# Patient Record
Sex: Female | Born: 1960 | Race: White | Hispanic: No | Marital: Married | Smoking: Former smoker
Health system: Southern US, Community
[De-identification: ages and names within clinical notes are randomized; demographics above are authoritative.]

## PROBLEM LIST (undated history)

## (undated) DIAGNOSIS — I10 Essential (primary) hypertension: Secondary | ICD-10-CM

## (undated) DIAGNOSIS — F32A Depression, unspecified: Secondary | ICD-10-CM

## (undated) DIAGNOSIS — T7840XA Allergy, unspecified, initial encounter: Secondary | ICD-10-CM

## (undated) DIAGNOSIS — F419 Anxiety disorder, unspecified: Secondary | ICD-10-CM

## (undated) DIAGNOSIS — K5900 Constipation, unspecified: Secondary | ICD-10-CM

## (undated) DIAGNOSIS — N200 Calculus of kidney: Secondary | ICD-10-CM

## (undated) DIAGNOSIS — I272 Pulmonary hypertension, unspecified: Secondary | ICD-10-CM

## (undated) DIAGNOSIS — F329 Major depressive disorder, single episode, unspecified: Secondary | ICD-10-CM

## (undated) DIAGNOSIS — K219 Gastro-esophageal reflux disease without esophagitis: Secondary | ICD-10-CM

## (undated) DIAGNOSIS — Z9889 Other specified postprocedural states: Secondary | ICD-10-CM

## (undated) DIAGNOSIS — G43909 Migraine, unspecified, not intractable, without status migrainosus: Secondary | ICD-10-CM

## (undated) DIAGNOSIS — M1990 Unspecified osteoarthritis, unspecified site: Secondary | ICD-10-CM

## (undated) DIAGNOSIS — J302 Other seasonal allergic rhinitis: Secondary | ICD-10-CM

## (undated) DIAGNOSIS — G8929 Other chronic pain: Secondary | ICD-10-CM

## (undated) DIAGNOSIS — E785 Hyperlipidemia, unspecified: Secondary | ICD-10-CM

## (undated) DIAGNOSIS — C4491 Basal cell carcinoma of skin, unspecified: Secondary | ICD-10-CM

## (undated) HISTORY — DX: Migraine, unspecified, not intractable, without status migrainosus: G43.909

## (undated) HISTORY — DX: Allergy, unspecified, initial encounter: T78.40XA

## (undated) HISTORY — DX: Other seasonal allergic rhinitis: J30.2

## (undated) HISTORY — DX: Essential (primary) hypertension: I10

## (undated) HISTORY — DX: Anxiety disorder, unspecified: F41.9

## (undated) HISTORY — DX: Depression, unspecified: F32.A

## (undated) HISTORY — PX: POLYPECTOMY: SHX149

## (undated) HISTORY — PX: COLONOSCOPY: SHX174

## (undated) HISTORY — DX: Hyperlipidemia, unspecified: E78.5

## (undated) HISTORY — DX: Constipation, unspecified: K59.00

## (undated) HISTORY — DX: Other chronic pain: G89.29

## (undated) HISTORY — DX: Gastro-esophageal reflux disease without esophagitis: K21.9

## (undated) HISTORY — DX: Unspecified osteoarthritis, unspecified site: M19.90

## (undated) HISTORY — DX: Calculus of kidney: N20.0

## (undated) HISTORY — DX: Major depressive disorder, single episode, unspecified: F32.9

## (undated) HISTORY — DX: Basal cell carcinoma of skin, unspecified: C44.91

---

## 1987-03-04 HISTORY — PX: HEMORRHOID SURGERY: SHX153

## 1988-03-03 HISTORY — PX: TUBAL LIGATION: SHX77

## 1997-06-22 ENCOUNTER — Encounter: Admission: RE | Admit: 1997-06-22 | Discharge: 1997-09-20 | Payer: Self-pay | Admitting: Family Medicine

## 1998-05-31 ENCOUNTER — Encounter: Payer: Self-pay | Admitting: Emergency Medicine

## 1998-05-31 ENCOUNTER — Emergency Department (HOSPITAL_COMMUNITY): Admission: EM | Admit: 1998-05-31 | Discharge: 1998-05-31 | Payer: Self-pay | Admitting: Emergency Medicine

## 2001-07-22 ENCOUNTER — Other Ambulatory Visit: Admission: RE | Admit: 2001-07-22 | Discharge: 2001-07-22 | Payer: Self-pay | Admitting: Unknown Physician Specialty

## 2004-03-03 HISTORY — PX: OTHER SURGICAL HISTORY: SHX169

## 2004-06-18 ENCOUNTER — Encounter: Admission: RE | Admit: 2004-06-18 | Discharge: 2004-07-25 | Payer: Self-pay | Admitting: Family Medicine

## 2004-08-07 ENCOUNTER — Encounter: Admission: RE | Admit: 2004-08-07 | Discharge: 2004-08-07 | Payer: Self-pay | Admitting: Neurosurgery

## 2004-08-21 ENCOUNTER — Encounter: Admission: RE | Admit: 2004-08-21 | Discharge: 2004-08-21 | Payer: Self-pay | Admitting: Neurosurgery

## 2004-12-16 ENCOUNTER — Encounter: Admission: RE | Admit: 2004-12-16 | Discharge: 2004-12-16 | Payer: Self-pay | Admitting: Neurosurgery

## 2005-05-17 ENCOUNTER — Emergency Department (HOSPITAL_COMMUNITY): Admission: EM | Admit: 2005-05-17 | Discharge: 2005-05-17 | Payer: Self-pay | Admitting: Emergency Medicine

## 2005-05-20 ENCOUNTER — Encounter: Admission: RE | Admit: 2005-05-20 | Discharge: 2005-05-20 | Payer: Self-pay | Admitting: Neurosurgery

## 2005-06-04 ENCOUNTER — Encounter: Admission: RE | Admit: 2005-06-04 | Discharge: 2005-06-04 | Payer: Self-pay | Admitting: Neurosurgery

## 2005-07-16 ENCOUNTER — Encounter: Admission: RE | Admit: 2005-07-16 | Discharge: 2005-07-16 | Payer: Self-pay | Admitting: Neurosurgery

## 2005-08-05 ENCOUNTER — Ambulatory Visit (HOSPITAL_COMMUNITY): Admission: RE | Admit: 2005-08-05 | Discharge: 2005-08-06 | Payer: Self-pay | Admitting: Neurosurgery

## 2009-03-06 ENCOUNTER — Encounter: Admission: RE | Admit: 2009-03-06 | Discharge: 2009-03-06 | Payer: Self-pay | Admitting: Neurosurgery

## 2010-03-24 ENCOUNTER — Encounter: Payer: Self-pay | Admitting: Neurosurgery

## 2010-07-19 NOTE — H&P (Signed)
Lisa Parrish, Lisa Parrish                ACCOUNT NO.:  [id number]   MEDICAL RECORD NO.:  [id number]          PATIENT TYPE:  AMB   LOCATION:  SDS                          FACILITY:  [facility]   PHYSICIAN:  Payton Doughty, M.D.      DATE OF BIRTH:  1961-02-15   DATE OF ADMISSION:  08/05/2005  DATE OF DISCHARGE:                                HISTORY & PHYSICAL   ADMISSION DIAGNOSES:  Herniated disk on the left side at L5-S1.   HISTORY OF PRESENT ILLNESS:  This is a 50 year old right handed white female  who hurt her back in April 2006, doing well and had gotten better from her  disk, and really had not had any trouble until about a month and a half ago  when she began having pain in her back and down her left leg.  MR showed  herniated disk at L5-S1.  She did epidural steroids and got better.  However, returned to work and had a marked increase of pain down her leg and  is now admitted for discectomy.   PAST MEDICAL HISTORY:  Hypertension.   MEDICATIONS:  1.  Toprol.  2.  Prempro.  3.  Wellbutrin.  4.  Percocet.   ALLERGIES:  None.   PAST SURGICAL HISTORY:  1.  Tubal ligation.  2.  Hemorrhoids in the past.   SOCIAL HISTORY:  She does not smoke or drink and works for [employer].   FAMILY HISTORY:  Mom died at 11 of lymphoma.  Dad died at 59 with  hypertension.   REVIEW OF SYSTEMS:  Remarkable for back and leg pain.  HEENT: Within normal  limits.  She has good range of motion.  NECK/CHEST:  Clear.  CARDIAC:  Regular rate and rhythm.  ABDOMEN:  Nontender with hepatosplenomegaly.  EXTREMITIES:  Without cyanosis, clubbing or edema.  GU:  Deferred.  Peripheral pulses good.  NEUROLOGICAL:  Awake, alert and oriented.  Cranial  nerves are intact.  Strength 5/5 in the upper and lower extremities.  Plantar flexor of the left foot where she cannot toe stand.  Sensation is  diminished in the left S1 distribution.  Deep tendon reflexes are 2 at the  knees, 2 at the right ankle, absent at  the left.  Straight leg raising is  positive on the left.   STUDIES:  New MR shows more disk material coming out at L5-S1.   CLINICAL IMPRESSION:  Herniated disk at L5-S1 with left S1 radiculopathy.   PLAN:  Lumbar laminectomy, diskectomy at that level.  The risks and benefits  have been discussed with her and she wished to proceed.           ______________________________  Payton Doughty, M.D.     MWR/MEDQ  D:  08/05/2005  T:  08/05/2005  Job:  [id number]

## 2010-07-19 NOTE — Op Note (Signed)
NAMELISSA, ROWLES                ACCOUNT NO.:  [id number]   MEDICAL RECORD NO.:  [id number]          PATIENT TYPE:  OIB   LOCATION:  3007                         FACILITY:  [facility]   PHYSICIAN:  Payton Doughty, M.D.      DATE OF BIRTH:  1960/03/24   DATE OF PROCEDURE:  08/05/2005  DATE OF DISCHARGE:                                 OPERATIVE REPORT   PREOPERATIVE DIAGNOSIS:  Herniated disk L5 S1 on left.   POSTOPERATIVE DIAGNOSIS:  Herniated disk L5 S1 on left.   OPERATIVE PROCEDURE:  L5 S1 laminectomy and diskectomy.   SERVICE:  Neurosurgery.   ANESTHESIA:  General endotracheal.   PREPARATION:  Prepped with alcohol wipe.   COMPLICATIONS:  None.   NURSE ASSISTANT:  [place].   DOCTOR ASSISTANT:  Danae Orleans. Venetia Maxon, M.D.   This is a 50 year old girl herniated disk L5 S1 on the left.  Taken to the  operating room __________ intubated, placed prone on the operating room  table.  Following shave, prepped and draped in the usual sterile fashion.  The skin was infiltrated with 1% lidocaine and 1:400,000 epinephrine.  The  skin was incised from bottom of L4 to the bottom of L5.  The lamina of L5  was exposed on the left side in subperiosteal plane.  Intraoperative x-rays  showed a marker under 4.  A hemisemilaminectomy of L5 was carried out with a  high speed drill as well as the Kerrison punch.  The bone was removed to the  top of the ligamentum flavum and was removed in a retrograde fashion.  Lateral extension was to the medial border of the L5 root.  The L5 root was  dissected out and there was a fragmented disk compressing it as it exited  the neuroforamen, this was removed without difficulty; there was an  inferiorly extruded fragment at L5 S1.  The anterior aspect of the S1 nerve  root was explored and a piece of disk identified and removed.  The disk  space was explored and all graspable fragments removed.  The wound was  irrigated, hemostasis assured.  Depo-Medrol soaked fat  placed in laminectomy  defect.  The fascia and subcutaneous tissues reapproximated with #0 Vicryl  in interrupted fashion, the subcuticular tissue was reapproximated with #3-0  Vicryl in interrupted fashion, the skin was closed with #4-0 Vicryl in  running subcuticular fashion.  Benzoin and Steri-Strips were placed  __________ Telfa and Op-Site.  The patient returned to the recovery room in  good condition.          ______________________________  Payton Doughty, M.D.    MWR/MEDQ  D:  08/05/2005  T:  08/05/2005  Job:  [id number]

## 2011-08-13 ENCOUNTER — Encounter: Payer: Self-pay | Admitting: Gastroenterology

## 2011-09-18 ENCOUNTER — Ambulatory Visit (AMBULATORY_SURGERY_CENTER): Payer: Managed Care, Other (non HMO) | Admitting: *Deleted

## 2011-09-18 VITALS — Ht 66.0 in | Wt 166.2 lb

## 2011-09-18 DIAGNOSIS — Z1211 Encounter for screening for malignant neoplasm of colon: Secondary | ICD-10-CM

## 2011-09-18 MED ORDER — MOVIPREP 100 G PO SOLR: ORAL | Status: DC

## 2011-10-02 ENCOUNTER — Ambulatory Visit (AMBULATORY_SURGERY_CENTER): Payer: Managed Care, Other (non HMO) | Admitting: Gastroenterology

## 2011-10-02 ENCOUNTER — Encounter: Payer: Self-pay | Admitting: Gastroenterology

## 2011-10-02 VITALS — BP 145/85 | HR 77 | Temp 97.1°F | Resp 20 | Ht 66.0 in | Wt 166.0 lb

## 2011-10-02 DIAGNOSIS — Z1211 Encounter for screening for malignant neoplasm of colon: Secondary | ICD-10-CM

## 2011-10-02 DIAGNOSIS — I82C19 Acute embolism and thrombosis of unspecified internal jugular vein: Secondary | ICD-10-CM

## 2011-10-02 DIAGNOSIS — D126 Benign neoplasm of colon, unspecified: Secondary | ICD-10-CM

## 2011-10-02 DIAGNOSIS — I82B19 Acute embolism and thrombosis of unspecified subclavian vein: Secondary | ICD-10-CM

## 2011-10-02 MED ORDER — SODIUM CHLORIDE 0.9 % IV SOLN: 500.0000 mL | INTRAVENOUS | Status: DC

## 2011-10-02 NOTE — Patient Instructions (Addendum)
Discharge instructions given with verbal understanding. Handout on polyp given. Resume previous medications. YOU HAD AN ENDOSCOPIC PROCEDURE TODAY AT THE [facility]: Refer to the procedure report that was given to you for any specific questions about what was found during the examination.  If the procedure report does not answer your questions, please call your gastroenterologist to clarify.  If you requested that your care partner not be given the details of your procedure findings, then the procedure report has been included in a sealed envelope for you to review at your convenience later.  YOU SHOULD EXPECT: Some feelings of bloating in the abdomen. Passage of more gas than usual.  Walking can help get rid of the air that was put into your GI tract during the procedure and reduce the bloating. If you had a lower endoscopy (such as a colonoscopy or flexible sigmoidoscopy) you may notice spotting of blood in your stool or on the toilet paper. If you underwent a bowel prep for your procedure, then you may not have a normal bowel movement for a few days.  DIET: Your first meal following the procedure should be a light meal and then it is ok to progress to your normal diet.  A half-sandwich or bowl of soup is an example of a good first meal.  Heavy or fried foods are harder to digest and may make you feel nauseous or bloated.  Likewise meals heavy in dairy and vegetables can cause extra gas to form and this can also increase the bloating.  Drink plenty of fluids but you should avoid alcoholic beverages for 24 hours.  ACTIVITY: Your care partner should take you home directly after the procedure.  You should plan to take it easy, moving slowly for the rest of the day.  You can resume normal activity the day after the procedure however you should NOT DRIVE or use heavy machinery for 24 hours (because of the sedation medicines used during the test).    SYMPTOMS TO REPORT IMMEDIATELY: A  gastroenterologist can be reached at any hour.  During normal business hours, 8:30 AM to 5:00 PM Monday through Friday, call [phone].  After hours and on weekends, please call the GI answering service at [phone] who will take a message and have the physician on call contact you.   Following lower endoscopy (colonoscopy or flexible sigmoidoscopy):  Excessive amounts of blood in the stool  Significant tenderness or worsening of abdominal pains  Swelling of the abdomen that is new, acute  Fever of 100F or higher   FOLLOW UP: If any biopsies were taken you will be contacted by phone or by letter within the next 1-3 weeks.  Call your gastroenterologist if you have not heard about the biopsies in 3 weeks.  Our staff will call the home number listed on your records the next business day following your procedure to check on you and address any questions or concerns that you may have at that time regarding the information given to you following your procedure. This is a courtesy call and so if there is no answer at the home number and we have not heard from you through the emergency physician on call, we will assume that you have returned to your regular daily activities without incident.  SIGNATURES/CONFIDENTIALITY: You and/or your care partner have signed paperwork which will be entered into your electronic medical record.  These signatures attest to the fact that that the information above on your After Visit Summary   has been reviewed and is understood.  Full responsibility of the confidentiality of this discharge information lies with you and/or your care-partner.  

## 2011-10-02 NOTE — Op Note (Signed)
[facility] [address] [place], [place]  [address]  COLONOSCOPY PROCEDURE REPORT  PATIENT:  Lisa Parrish, Lisa Parrish  MR#:  [id number] BIRTHDATE:  1961-01-26, 50 yrs. old  GENDER:  female ENDOSCOPIST:  Barbette Hair. Arlyce Dice, MD REF. BY:  Rudi Heap, M.D. PROCEDURE DATE:  10/02/2011 PROCEDURE:  Colonoscopy with snare polypectomy ASA CLASS:  Class II INDICATIONS:  Routine Risk Screening MEDICATIONS:   MAC sedation, administered by CRNA propofol 350mg IV  DESCRIPTION OF PROCEDURE:   After the risks benefits and alternatives of the procedure were thoroughly explained, informed consent was obtained.  Digital rectal exam was performed and revealed moderate external hemorrhoids.   The LB CF-[id number] endoscope was introduced through the anus and advanced to the cecum, which was identified by both the appendix and ileocecal valve, without limitations.  The quality of the prep was excellent, using MoviPrep.  The instrument was then slowly withdrawn as the colon was fully examined. <<PROCEDUREIMAGES>>  FINDINGS:  A pedunculated polyp was found in the sigmoid colon. It was 12 mm in size. It was found 20 cm from the point of entry. Polyp was snared, then cauterized with monopolar cautery. Retrieval was successful (see image4). snare polyp  This was otherwise a normal examination of the colon (see image3, image2, and image5).   Retroflexed views in the rectum revealed no abnormalities.    The time to cecum =  1) 3.25  minutes. The scope was then withdrawn in  1) 6.25  minutes from the cecum and the procedure completed. COMPLICATIONS:  None ENDOSCOPIC IMPRESSION: 1) 12 mm pedunculated polyp in the sigmoid colon 2) Otherwise normal examination RECOMMENDATIONS: 1) If the polyp(s) removed today are proven to be adenomatous (pre-cancerous) polyps, you will need a repeat colonoscopy in 5 years. Otherwise you should continue to follow colorectal cancer screening guidelines for  "routine risk" patients with colonoscopy in 10 years. You will receive a letter within 1-2 weeks with the results of your biopsy as well as final recommendations. Please call my office if you have not received a letter after 3 weeks. REPEAT EXAM:  You will receive a letter from Dr. Arlyce Dice in 1-2 weeks, after reviewing the final pathology, with followup recommendations.  ______________________________ Barbette Hair Arlyce Dice, MD  CC:  n. eSIGNED:   Barbette Hair. Rifka Ramey at 10/02/2011 09:22 AM  Harter-harris, Lupita Leash, [phone]

## 2011-10-02 NOTE — Progress Notes (Signed)
Patient did not experience any of the following events: a burn prior to discharge; a fall within the facility; wrong site/side/patient/procedure/implant event; or a hospital transfer or hospital admission upon discharge from the facility. (G8907) Patient did not have preoperative order for IV antibiotic SSI prophylaxis. (G8918)  

## 2011-10-02 NOTE — Progress Notes (Signed)
ropofol per Ashley amp crna. See scanned intra procedure report. All meds titrated per crna throughout procedure. ewm

## 2011-10-03 ENCOUNTER — Telehealth: Payer: Self-pay | Admitting: *Deleted

## 2011-10-03 NOTE — Telephone Encounter (Signed)
  Follow up Call-  Call back number 10/02/2011  Post procedure Call Back phone  # [phone]  Permission to leave phone message Yes     Patient questions:  Do you have a fever, pain , or abdominal swelling? no Pain Score  0 *  Have you tolerated food without any problems? yes  Have you been able to return to your normal activities? yes  Do you have any questions about your discharge instructions: Diet   no Medications  no Follow up visit  no  Do you have questions or concerns about your Care? no  Actions: * If pain score is 4 or above: No action needed, pain <4.

## 2011-10-08 ENCOUNTER — Encounter: Payer: Self-pay | Admitting: Gastroenterology

## 2012-06-29 ENCOUNTER — Other Ambulatory Visit: Payer: Self-pay | Admitting: *Deleted

## 2012-06-29 MED ORDER — BUPROPION HCL ER (XL) 300 MG PO TB24: 300.0000 mg | ORAL_TABLET | Freq: Every day | ORAL | Status: DC

## 2012-06-29 NOTE — Telephone Encounter (Signed)
Last office visit 08-13-11. Please advise. Thank you

## 2012-07-02 ENCOUNTER — Other Ambulatory Visit: Payer: Self-pay

## 2012-07-02 MED ORDER — PRAVASTATIN SODIUM 10 MG PO TABS: 10.0000 mg | ORAL_TABLET | Freq: Every day | ORAL | Status: DC

## 2012-07-02 NOTE — Telephone Encounter (Signed)
Last lipids 6/13

## 2012-08-04 ENCOUNTER — Telehealth: Payer: Self-pay | Admitting: Family Medicine

## 2012-08-04 ENCOUNTER — Ambulatory Visit (INDEPENDENT_AMBULATORY_CARE_PROVIDER_SITE_OTHER): Payer: Medicare HMO | Admitting: General Practice

## 2012-08-04 ENCOUNTER — Encounter: Payer: Self-pay | Admitting: General Practice

## 2012-08-04 VITALS — BP 124/88 | HR 92 | Temp 98.0°F | Ht 66.0 in | Wt 171.0 lb

## 2012-08-04 DIAGNOSIS — J029 Acute pharyngitis, unspecified: Secondary | ICD-10-CM

## 2012-08-04 LAB — POCT RAPID STREP A (OFFICE): Rapid Strep A Screen: NEGATIVE

## 2012-08-04 NOTE — Patient Instructions (Addendum)

## 2012-08-04 NOTE — Progress Notes (Signed)
  Subjective:    Patient ID: Lisa Parrish, female    DOB: 1960-09-01, 52 y.o.   MRN: [id number]  Sore Throat  This is a new problem. The current episode started yesterday. The problem has been gradually worsening. Neither side of throat is experiencing more pain than the other. There has been no fever. The pain is at a severity of 8/10. Associated symptoms include congestion and headaches. Pertinent negatives include no abdominal pain, coughing, ear pain, neck pain or shortness of breath.      Review of Systems  Constitutional: Negative for fever and chills.  HENT: Positive for congestion and sore throat. Negative for ear pain, rhinorrhea, sneezing, neck pain, neck stiffness, postnasal drip and sinus pressure.   Respiratory: Negative for cough, chest tightness and shortness of breath.   Cardiovascular: Negative for chest pain and palpitations.  Gastrointestinal: Negative for abdominal pain.  Skin: Negative.   Neurological: Positive for headaches. Negative for syncope and weakness.  All other systems reviewed and are negative.       Objective:   Physical Exam  Constitutional: She is oriented to person, place, and time. She appears well-developed and well-nourished.  HENT:  Head: Normocephalic and atraumatic.  Right Ear: External ear normal.  Left Ear: External ear normal.  Cardiovascular: Normal rate, regular rhythm and normal heart sounds.   Pulmonary/Chest: Effort normal and breath sounds normal.  Neurological: She is alert and oriented to person, place, and time.  Skin: Skin is warm and dry.  Psychiatric: She has a normal mood and affect.   Results for orders placed in visit on 08/04/12  POCT RAPID STREP A (OFFICE)      Result Value Range   Rapid Strep A Screen Negative  Negative           Assessment & Plan:  1. Sore throat - POCT rapid strep A -gargle with warm salt water -motrin or tylenol for discomfort -RTO if symptoms worsen -Patient verbalized  understanding -Coralie Keens, FNP-C

## 2012-08-04 NOTE — Telephone Encounter (Signed)
Appt given for today 

## 2012-08-05 ENCOUNTER — Other Ambulatory Visit: Payer: Self-pay | Admitting: General Practice

## 2012-08-05 ENCOUNTER — Telehealth: Payer: Self-pay | Admitting: General Practice

## 2012-08-05 MED ORDER — AZITHROMYCIN 250 MG PO TABS: ORAL_TABLET | ORAL | Status: DC

## 2012-08-05 NOTE — Telephone Encounter (Signed)
Zpac script sent to pharmacy

## 2012-08-05 NOTE — Telephone Encounter (Signed)
PT AWARE ZPAK SINCE TO PHARM

## 2012-08-06 ENCOUNTER — Other Ambulatory Visit: Payer: Self-pay | Admitting: *Deleted

## 2012-08-06 MED ORDER — PRAVASTATIN SODIUM 10 MG PO TABS: 10.0000 mg | ORAL_TABLET | Freq: Every day | ORAL | Status: DC

## 2012-08-06 NOTE — Telephone Encounter (Signed)
LAST LABS 6/13.

## 2012-08-19 ENCOUNTER — Ambulatory Visit: Payer: Medicare HMO | Admitting: General Practice

## 2012-08-24 ENCOUNTER — Ambulatory Visit (INDEPENDENT_AMBULATORY_CARE_PROVIDER_SITE_OTHER): Payer: Medicare HMO | Admitting: Physician Assistant

## 2012-08-24 ENCOUNTER — Encounter: Payer: Self-pay | Admitting: Physician Assistant

## 2012-08-24 ENCOUNTER — Other Ambulatory Visit: Payer: Self-pay | Admitting: *Deleted

## 2012-08-24 VITALS — BP 124/87 | HR 82 | Temp 97.9°F | Ht 66.0 in | Wt 175.0 lb

## 2012-08-24 DIAGNOSIS — G43909 Migraine, unspecified, not intractable, without status migrainosus: Secondary | ICD-10-CM

## 2012-08-24 DIAGNOSIS — E785 Hyperlipidemia, unspecified: Secondary | ICD-10-CM

## 2012-08-24 DIAGNOSIS — F411 Generalized anxiety disorder: Secondary | ICD-10-CM

## 2012-08-24 DIAGNOSIS — I10 Essential (primary) hypertension: Secondary | ICD-10-CM | POA: Insufficient documentation

## 2012-08-24 HISTORY — DX: Generalized anxiety disorder: F41.1

## 2012-08-24 LAB — LIPID PANEL
Cholesterol: 187 mg/dL (ref 0–200)
HDL: 51 mg/dL (ref >39)
LDL Cholesterol: 103 mg/dL — ABNORMAL HIGH (ref 0–99)
Total CHOL/HDL Ratio: 3.7 Ratio
Triglycerides: 167 mg/dL — ABNORMAL HIGH (ref <150)
VLDL: 33 mg/dL (ref 0–40)

## 2012-08-24 LAB — HEPATIC FUNCTION PANEL
ALT: 17 U/L (ref 0–35)
AST: 16 U/L (ref 0–37)
Albumin: 4.2 g/dL (ref 3.5–5.2)
Alkaline Phosphatase: 78 U/L (ref 39–117)
Bilirubin, Direct: 0.1 mg/dL (ref 0.0–0.3)
Indirect Bilirubin: 0.4 mg/dL (ref 0.0–0.9)
Total Bilirubin: 0.5 mg/dL (ref 0.3–1.2)
Total Protein: 7.1 g/dL (ref 6.0–8.3)

## 2012-08-24 MED ORDER — PRAVASTATIN SODIUM 10 MG PO TABS: 10.0000 mg | ORAL_TABLET | Freq: Every day | ORAL | Status: DC

## 2012-08-24 MED ORDER — METOPROLOL TARTRATE 50 MG PO TABS: 50.0000 mg | ORAL_TABLET | Freq: Every day | ORAL | Status: DC

## 2012-08-24 MED ORDER — BUPROPION HCL ER (XL) 300 MG PO TB24: 300.0000 mg | ORAL_TABLET | Freq: Every day | ORAL | Status: DC

## 2012-08-24 NOTE — Patient Instructions (Signed)

## 2012-08-24 NOTE — Progress Notes (Signed)
Subjective:     Patient ID: Lisa Parrish, female   DOB: [birth year], 52 y.o.   MRN: [id number]  HPI Pt here for recheck of HTN and hyperlipid She has done well since last visit Denies any CP, SOB, lower ext edema, or change in endurance She has hx of migraines but has not noticed an increase in sx   Review of Systems  All other systems reviewed and are negative.       Objective:   Physical Exam  Nursing note and vitals reviewed. No JVD/Bruits Heart- RRR w/o M Lungs- CTA bilat Pulses equal in upper ext No lower ext edema Full labs pending     Assessment:     HTN Hyperlipid    Plan:     Will inform of lab results Meds rf done today Cont all other meds F/U in 6 months

## 2012-08-25 LAB — BASIC METABOLIC PANEL WITH GFR
BUN: 12 mg/dL (ref 6–23)
CO2: 27 mEq/L (ref 19–32)
Calcium: 9.4 mg/dL (ref 8.4–10.5)
Chloride: 101 mEq/L (ref 96–112)
Creat: 0.65 mg/dL (ref 0.50–1.10)
GFR, Est African American: >89 mL/min
GFR, Est Non African American: >89 mL/min
Glucose, Bld: 82 mg/dL (ref 70–99)
Potassium: 4.5 mEq/L (ref 3.5–5.3)
Sodium: 139 mEq/L (ref 135–145)

## 2012-12-20 ENCOUNTER — Other Ambulatory Visit: Payer: Self-pay | Admitting: *Deleted

## 2012-12-20 MED ORDER — BUPROPION HCL ER (XL) 300 MG PO TB24: 300.0000 mg | ORAL_TABLET | Freq: Every day | ORAL | Status: DC

## 2012-12-20 NOTE — Telephone Encounter (Signed)
Last seen by you on 08/04/12, she is also requesting Azelastine opthalmic solution, but i dont see it on chart

## 2012-12-22 ENCOUNTER — Other Ambulatory Visit: Payer: Self-pay

## 2012-12-22 NOTE — Telephone Encounter (Signed)
Last seen 08/24/12  [facility]  Then Mae 08/04/12  This med not on EPIC list

## 2012-12-23 MED ORDER — AZELASTINE HCL 0.05 % OP SOLN: OPHTHALMIC | Status: DC

## 2013-02-24 ENCOUNTER — Emergency Department (HOSPITAL_COMMUNITY)
Admission: EM | Admit: 2013-02-24 | Discharge: 2013-02-24 | Disposition: A | Discharge status: Home / Self Care | Payer: 59 | Attending: Emergency Medicine | Admitting: Emergency Medicine

## 2013-02-24 ENCOUNTER — Other Ambulatory Visit: Payer: Self-pay

## 2013-02-24 ENCOUNTER — Emergency Department (HOSPITAL_COMMUNITY): Payer: 59

## 2013-02-24 DIAGNOSIS — Z79899 Other long term (current) drug therapy: Secondary | ICD-10-CM | POA: Insufficient documentation

## 2013-02-24 DIAGNOSIS — E785 Hyperlipidemia, unspecified: Secondary | ICD-10-CM | POA: Insufficient documentation

## 2013-02-24 DIAGNOSIS — F329 Major depressive disorder, single episode, unspecified: Secondary | ICD-10-CM | POA: Insufficient documentation

## 2013-02-24 DIAGNOSIS — G43909 Migraine, unspecified, not intractable, without status migrainosus: Secondary | ICD-10-CM | POA: Insufficient documentation

## 2013-02-24 DIAGNOSIS — F411 Generalized anxiety disorder: Secondary | ICD-10-CM | POA: Insufficient documentation

## 2013-02-24 DIAGNOSIS — R110 Nausea: Secondary | ICD-10-CM | POA: Insufficient documentation

## 2013-02-24 DIAGNOSIS — R000 Tachycardia, unspecified: Secondary | ICD-10-CM | POA: Insufficient documentation

## 2013-02-24 DIAGNOSIS — Z87442 Personal history of urinary calculi: Secondary | ICD-10-CM | POA: Insufficient documentation

## 2013-02-24 DIAGNOSIS — F3289 Other specified depressive episodes: Secondary | ICD-10-CM | POA: Insufficient documentation

## 2013-02-24 DIAGNOSIS — R5381 Other malaise: Secondary | ICD-10-CM | POA: Insufficient documentation

## 2013-02-24 DIAGNOSIS — R002 Palpitations: Secondary | ICD-10-CM

## 2013-02-24 DIAGNOSIS — J40 Bronchitis, not specified as acute or chronic: Secondary | ICD-10-CM

## 2013-02-24 DIAGNOSIS — M1990 Unspecified osteoarthritis, unspecified site: Secondary | ICD-10-CM | POA: Insufficient documentation

## 2013-02-24 DIAGNOSIS — Z87891 Personal history of nicotine dependence: Secondary | ICD-10-CM | POA: Insufficient documentation

## 2013-02-24 DIAGNOSIS — I10 Essential (primary) hypertension: Secondary | ICD-10-CM | POA: Insufficient documentation

## 2013-02-24 DIAGNOSIS — R51 Headache: Secondary | ICD-10-CM | POA: Insufficient documentation

## 2013-02-24 LAB — CBC
HCT: 37.8 % (ref 36.0–46.0)
Hemoglobin: 12.6 g/dL (ref 12.0–15.0)
MCH: 28.7 pg (ref 26.0–34.0)
MCHC: 33.3 g/dL (ref 30.0–36.0)
MCV: 86.1 fL (ref 78.0–100.0)
Platelets: 183 10*3/uL (ref 150–400)
RBC: 4.39 MIL/uL (ref 3.87–5.11)
RDW: 13.1 % (ref 11.5–15.5)
WBC: 3.9 10*3/uL — ABNORMAL LOW (ref 4.0–10.5)

## 2013-02-24 LAB — BASIC METABOLIC PANEL
BUN: 12 mg/dL (ref 6–23)
CO2: 27 mEq/L (ref 19–32)
Calcium: 9.3 mg/dL (ref 8.4–10.5)
Chloride: 100 mEq/L (ref 96–112)
Creatinine, Ser: 0.68 mg/dL (ref 0.50–1.10)
GFR calc Af Amer: >90 mL/min (ref >90)
GFR calc non Af Amer: >90 mL/min (ref >90)
Glucose, Bld: 107 mg/dL — ABNORMAL HIGH (ref 70–99)
Potassium: 3.6 mEq/L (ref 3.5–5.1)
Sodium: 139 mEq/L (ref 135–145)

## 2013-02-24 LAB — TROPONIN I
Troponin I: <0.3 ng/mL (ref <0.30)
Troponin I: <0.3 ng/mL (ref <0.30)

## 2013-02-24 LAB — D-DIMER, QUANTITATIVE: D-Dimer, Quant: <0.27 ug/mL-FEU (ref 0.00–0.48)

## 2013-02-24 MED ORDER — SODIUM CHLORIDE 0.9 % IV BOLUS (SEPSIS)
1000.0000 mL | Freq: Once | INTRAVENOUS | Status: AC
  Administered 2013-02-24: 1000 mL via INTRAVENOUS

## 2013-02-24 MED ORDER — ASPIRIN 81 MG PO CHEW
324.0000 mg | CHEWABLE_TABLET | Freq: Once | ORAL | Status: AC
  Administered 2013-02-24: 324 mg via ORAL
  Filled 2013-02-24: qty 4

## 2013-02-24 MED ORDER — ALBUTEROL SULFATE HFA 108 (90 BASE) MCG/ACT IN AERS
2.0000 | INHALATION_SPRAY | Freq: Once | RESPIRATORY_TRACT | Status: AC
  Administered 2013-02-24: 2 via RESPIRATORY_TRACT
  Filled 2013-02-24: qty 6.7

## 2013-02-24 NOTE — ED Provider Notes (Signed)
CSN: [id number]     Arrival date & time 02/24/13  6962 History   First MD Initiated Contact with Patient 02/24/13 [phone]     Chief Complaint  Patient presents with  . Chest Pain    onset was 0530. patient was  going to restroom. no cardiac hx  . Shortness of Breath   (Consider location/radiation/quality/duration/timing/severity/associated sxs/prior Treatment) HPI Comments: 52 year old female presents with about 45 minutes of palpitations and dyspnea. She states it started shortly after she woke up at 5:30 AM. She states when she probably laid back in bed she started feeling her heart racing and pounding with associated dyspnea. She states never felt like this before. There is no pressure, squeezing, or sharp pain. States the pain is coming from the palpitations itself. She states this pain resolved on its own. No hemoptysis, leg swelling, leg pain, recent surgery, cancer history, or recent travel. She states that she still feels a little bit dyspneic the palpitations have resolved. Denies a smoking history. She states during the episode she was feeling generalized weakness and some nausea but did not vomit. She has had a "cold" with some dry cough and runny nose over the past 3-4 days. She also now has a headache is been going on since this started. She states his headache feels like her multiple prior migraines.   Past Medical History  Diagnosis Date  . Osteoarthritis     back  . Hyperlipidemia   . Hypertension   . Headache, migraine   . Anxiety   . Seasonal allergies   . Depression   . Kidney stones    Past Surgical History  Procedure Laterality Date  . Hemorrhoid surgery  1989  . Tubal ligation  1990  . Lumbar disckectomy  2006   Family History  Problem Relation Age of Onset  . Colon cancer Paternal Aunt 53    father's half sister  . Colon cancer Paternal Uncle 41    father's half brother  . Cancer Mother   . Heart disease Father    History  Substance Use Topics  . Smoking  status: Former Smoker    Quit date: 09/18/1991  . Smokeless tobacco: Never Used  . Alcohol Use: No   OB History   Grav Para Term Preterm Abortions TAB SAB Ect Mult Living                 Review of Systems  Constitutional: Negative for fever.  Respiratory: Positive for cough and shortness of breath.   Cardiovascular: Positive for chest pain and palpitations. Negative for leg swelling.  Gastrointestinal: Positive for nausea. Negative for vomiting and abdominal pain.  Neurological: Positive for weakness and headaches.  All other systems reviewed and are negative.    Allergies  Morphine and related  Home Medications   Current Outpatient Rx  Name  Route  Sig  Dispense  Refill  . azelastine (OPTIVAR) 0.05 % ophthalmic solution      One drop in each eye daily   6 mL   12   . buPROPion (WELLBUTRIN XL) 300 MG 24 hr tablet   Oral   Take 1 tablet (300 mg total) by mouth daily.   90 tablet   0   . cyclobenzaprine (FLEXERIL) 10 MG tablet   Oral   Take 10 mg by mouth 3 (three) times daily as needed.         . Hydrocodone-Acetaminophen (VICODIN PO)   Oral   Take by mouth as needed.         Marland Kitchen  metoprolol (LOPRESSOR) 50 MG tablet   Oral   Take 1 tablet (50 mg total) by mouth daily.   90 tablet   2   . pravastatin (PRAVACHOL) 10 MG tablet   Oral   Take 1 tablet (10 mg total) by mouth daily.   90 tablet   2     No more refills until patient is seen   . zonisamide (ZONEGRAN) 25 MG capsule   Oral   Take 25 mg by mouth daily. Takes 3 tablets daily          BP 134/97  Pulse 102  Temp(Src) 98 F (36.7 C) (Oral)  Resp 20  Ht 5\' 6"  (1.676 m)  Wt 170 lb (77.111 kg)  BMI 27.45 kg/m2  SpO2 98% Physical Exam  Nursing note and vitals reviewed. Constitutional: She is oriented to person, place, and time. She appears well-developed and well-nourished. No distress.  HENT:  Head: Normocephalic and atraumatic.  Right Ear: External ear normal.  Left Ear: External ear  normal.  Nose: Nose normal.  Eyes: Right eye exhibits no discharge. Left eye exhibits no discharge.  Cardiovascular: Regular rhythm and normal heart sounds.  Tachycardia present.   Pulmonary/Chest: Effort normal and breath sounds normal. She has no wheezes.  Abdominal: Soft. There is no tenderness.  Musculoskeletal: She exhibits no edema and no tenderness.  Neurological: She is alert and oriented to person, place, and time.  Skin: Skin is warm and dry.    ED Course  Procedures (including critical care time) Labs Review Labs Reviewed  CBC - Abnormal; Notable for the following:    WBC 3.9 (*)    All other components within normal limits  BASIC METABOLIC PANEL - Abnormal; Notable for the following:    Glucose, Bld 107 (*)    All other components within normal limits  TROPONIN I  D-DIMER, QUANTITATIVE  TROPONIN I   Imaging Review Dg Chest Port 1 View  02/24/2013   CLINICAL DATA:  Chest pain, history of hypertension  EXAM: PORTABLE CHEST - 1 VIEW  COMPARISON:  08/01/2005  FINDINGS: The heart size and mediastinal contours are within normal limits. Both lungs are clear. The visualized skeletal structures are unremarkable. Lung volumes are low with crowding of the bronchovascular markings and detail obscured by cardiac leads.  IMPRESSION: No active disease.   Electronically Signed   By: Christiana Pellant M.D.   On: 02/24/2013 07:25    EKG Interpretation   None       Date: 02/24/2013  Rate: 108  Rhythm: sinus tachycardia  QRS Axis: normal  Intervals: normal  ST/T Wave abnormalities: nonspecific T wave changes  Conduction Disutrbances:none  Narrative Interpretation: Sinus tachycardia with nonspecific T wave changes  Old EKG Reviewed: changes noted   MDM   1. Palpitations   2. Bronchitis    The patient's chest pain is atypical. She is low risk for pulmonary embolism, and with neg ddimer I feel it is very unlikely now. Doubt dissection. Her EKG is nonspecific, but no gross  abnormalities. Has 2 negative troponins. I feel is unlikely to be ACS. No signs of pneumonia. Her feeling of dyspnea could be related to her recent URI. Given albuterol, states that after this her dyspnea completely resolved. Likely has some bronchitis component with recent URI. I discussed that she is low risk for ACS but needs close f/u with her PCP for outpatient workup. She understands this and understands return precautions.    Audree Camel, MD 02/24/13 1006

## 2013-06-12 ENCOUNTER — Emergency Department (HOSPITAL_COMMUNITY): Payer: 59

## 2013-06-12 ENCOUNTER — Encounter (HOSPITAL_COMMUNITY): Payer: Self-pay | Admitting: Emergency Medicine

## 2013-06-12 ENCOUNTER — Inpatient Hospital Stay (HOSPITAL_COMMUNITY)
Admission: EM | Admit: 2013-06-12 | Discharge: 2013-06-14 | DRG: 603 | Disposition: A | Discharge status: Home / Self Care | Payer: 59 | Attending: Internal Medicine | Admitting: Internal Medicine

## 2013-06-12 DIAGNOSIS — I10 Essential (primary) hypertension: Secondary | ICD-10-CM | POA: Diagnosis present

## 2013-06-12 DIAGNOSIS — L0201 Cutaneous abscess of face: Principal | ICD-10-CM | POA: Diagnosis present

## 2013-06-12 DIAGNOSIS — F411 Generalized anxiety disorder: Secondary | ICD-10-CM | POA: Diagnosis present

## 2013-06-12 DIAGNOSIS — J019 Acute sinusitis, unspecified: Secondary | ICD-10-CM

## 2013-06-12 DIAGNOSIS — L03211 Cellulitis of face: Principal | ICD-10-CM | POA: Diagnosis present

## 2013-06-12 DIAGNOSIS — E876 Hypokalemia: Secondary | ICD-10-CM | POA: Diagnosis present

## 2013-06-12 DIAGNOSIS — F329 Major depressive disorder, single episode, unspecified: Secondary | ICD-10-CM | POA: Diagnosis present

## 2013-06-12 DIAGNOSIS — D649 Anemia, unspecified: Secondary | ICD-10-CM | POA: Diagnosis present

## 2013-06-12 DIAGNOSIS — Z8249 Family history of ischemic heart disease and other diseases of the circulatory system: Secondary | ICD-10-CM

## 2013-06-12 DIAGNOSIS — F3289 Other specified depressive episodes: Secondary | ICD-10-CM | POA: Diagnosis present

## 2013-06-12 DIAGNOSIS — Z87891 Personal history of nicotine dependence: Secondary | ICD-10-CM

## 2013-06-12 DIAGNOSIS — E785 Hyperlipidemia, unspecified: Secondary | ICD-10-CM | POA: Diagnosis present

## 2013-06-12 DIAGNOSIS — M1990 Unspecified osteoarthritis, unspecified site: Secondary | ICD-10-CM | POA: Diagnosis present

## 2013-06-12 DIAGNOSIS — G43909 Migraine, unspecified, not intractable, without status migrainosus: Secondary | ICD-10-CM

## 2013-06-12 DIAGNOSIS — G8929 Other chronic pain: Secondary | ICD-10-CM

## 2013-06-12 DIAGNOSIS — E871 Hypo-osmolality and hyponatremia: Secondary | ICD-10-CM | POA: Diagnosis present

## 2013-06-12 DIAGNOSIS — Z800 Family history of malignant neoplasm of digestive organs: Secondary | ICD-10-CM

## 2013-06-12 DIAGNOSIS — J329 Chronic sinusitis, unspecified: Secondary | ICD-10-CM

## 2013-06-12 DIAGNOSIS — Z87442 Personal history of urinary calculi: Secondary | ICD-10-CM

## 2013-06-12 LAB — CBC WITH DIFFERENTIAL/PLATELET
Basophils Absolute: 0 10*3/uL (ref 0.0–0.1)
Basophils Relative: 0 % (ref 0–1)
Eosinophils Absolute: 0 10*3/uL (ref 0.0–0.7)
Eosinophils Relative: 0 % (ref 0–5)
HCT: 34.6 % — ABNORMAL LOW (ref 36.0–46.0)
Hemoglobin: 11.4 g/dL — ABNORMAL LOW (ref 12.0–15.0)
Lymphocytes Relative: 12 % (ref 12–46)
Lymphs Abs: 1.2 10*3/uL (ref 0.7–4.0)
MCH: 28.9 pg (ref 26.0–34.0)
MCHC: 32.9 g/dL (ref 30.0–36.0)
MCV: 87.8 fL (ref 78.0–100.0)
Monocytes Absolute: 0.8 10*3/uL (ref 0.1–1.0)
Monocytes Relative: 8 % (ref 3–12)
Neutro Abs: 8.3 10*3/uL — ABNORMAL HIGH (ref 1.7–7.7)
Neutrophils Relative %: 80 % — ABNORMAL HIGH (ref 43–77)
Platelets: 252 10*3/uL (ref 150–400)
RBC: 3.94 MIL/uL (ref 3.87–5.11)
RDW: 13.3 % (ref 11.5–15.5)
WBC: 10.4 10*3/uL (ref 4.0–10.5)

## 2013-06-12 LAB — BASIC METABOLIC PANEL
BUN: 15 mg/dL (ref 6–23)
CO2: 29 mEq/L (ref 19–32)
Calcium: 9.3 mg/dL (ref 8.4–10.5)
Chloride: 95 mEq/L — ABNORMAL LOW (ref 96–112)
Creatinine, Ser: 0.66 mg/dL (ref 0.50–1.10)
GFR calc Af Amer: >90 mL/min (ref >90)
GFR calc non Af Amer: >90 mL/min (ref >90)
Glucose, Bld: 104 mg/dL — ABNORMAL HIGH (ref 70–99)
Potassium: 3.6 mEq/L — ABNORMAL LOW (ref 3.7–5.3)
Sodium: 134 mEq/L — ABNORMAL LOW (ref 137–147)

## 2013-06-12 LAB — MRSA PCR SCREENING: MRSA by PCR: NEGATIVE

## 2013-06-12 MED ORDER — DIPHENHYDRAMINE HCL 25 MG PO CAPS
25.0000 mg | ORAL_CAPSULE | Freq: Once | ORAL | Status: AC
  Administered 2013-06-12: 25 mg via ORAL
  Filled 2013-06-12: qty 1

## 2013-06-12 MED ORDER — BUPROPION HCL ER (XL) 300 MG PO TB24
300.0000 mg | ORAL_TABLET | Freq: Every day | ORAL | Status: DC
  Administered 2013-06-12 – 2013-06-14 (×3): 300 mg via ORAL
  Filled 2013-06-12 (×4): qty 1

## 2013-06-12 MED ORDER — ENOXAPARIN SODIUM 40 MG/0.4ML ~~LOC~~ SOLN
40.0000 mg | SUBCUTANEOUS | Status: DC
  Administered 2013-06-12 – 2013-06-13 (×2): 40 mg via SUBCUTANEOUS
  Filled 2013-06-12 (×2): qty 0.4

## 2013-06-12 MED ORDER — SODIUM CHLORIDE 0.9 % IJ SOLN: 3.0000 mL | INTRAMUSCULAR | Status: DC | PRN

## 2013-06-12 MED ORDER — ONDANSETRON HCL 4 MG/2ML IJ SOLN: 4.0000 mg | Freq: Four times a day (QID) | INTRAMUSCULAR | Status: DC | PRN

## 2013-06-12 MED ORDER — SIMVASTATIN 10 MG PO TABS
5.0000 mg | ORAL_TABLET | Freq: Every day | ORAL | Status: DC
  Administered 2013-06-12 – 2013-06-13 (×2): 5 mg via ORAL
  Filled 2013-06-12 (×3): qty 1

## 2013-06-12 MED ORDER — VANCOMYCIN HCL IN DEXTROSE 1-5 GM/200ML-% IV SOLN
1000.0000 mg | Freq: Once | INTRAVENOUS | Status: AC
Start: 2013-06-12 — End: 2013-06-12
  Administered 2013-06-12: 1000 mg via INTRAVENOUS
  Filled 2013-06-12: qty 200

## 2013-06-12 MED ORDER — ONDANSETRON HCL 4 MG/2ML IJ SOLN: 4.0000 mg | Freq: Three times a day (TID) | INTRAMUSCULAR | Status: DC | PRN

## 2013-06-12 MED ORDER — METOPROLOL SUCCINATE ER 50 MG PO TB24
50.0000 mg | ORAL_TABLET | Freq: Every day | ORAL | Status: DC
  Administered 2013-06-12 – 2013-06-14 (×3): 50 mg via ORAL
  Filled 2013-06-12 (×3): qty 1

## 2013-06-12 MED ORDER — CLINDAMYCIN PHOSPHATE 600 MG/50ML IV SOLN
600.0000 mg | Freq: Three times a day (TID) | INTRAVENOUS | Status: DC
  Administered 2013-06-12 – 2013-06-14 (×7): 600 mg via INTRAVENOUS
  Filled 2013-06-12 (×10): qty 50

## 2013-06-12 MED ORDER — HYDROMORPHONE HCL PF 1 MG/ML IJ SOLN
0.5000 mg | INTRAMUSCULAR | Status: DC | PRN
  Administered 2013-06-13: 0.5 mg via INTRAVENOUS
  Filled 2013-06-12: qty 1

## 2013-06-12 MED ORDER — SODIUM CHLORIDE 0.9 % IV SOLN: Freq: Once | INTRAVENOUS | Status: DC

## 2013-06-12 MED ORDER — SODIUM CHLORIDE 0.9 % IV SOLN: 250.0000 mL | INTRAVENOUS | Status: DC | PRN

## 2013-06-12 MED ORDER — HYDROMORPHONE HCL PF 1 MG/ML IJ SOLN: 1.0000 mg | INTRAMUSCULAR | Status: DC | PRN

## 2013-06-12 MED ORDER — ONDANSETRON HCL 4 MG PO TABS: 4.0000 mg | ORAL_TABLET | Freq: Four times a day (QID) | ORAL | Status: DC | PRN

## 2013-06-12 MED ORDER — IOHEXOL 300 MG/ML  SOLN
80.0000 mL | Freq: Once | INTRAMUSCULAR | Status: AC | PRN
  Administered 2013-06-12: 80 mL via INTRAVENOUS

## 2013-06-12 MED ORDER — HYDROCODONE-ACETAMINOPHEN 5-325 MG PO TABS
1.0000 | ORAL_TABLET | ORAL | Status: DC | PRN
  Administered 2013-06-12 – 2013-06-13 (×4): 1 via ORAL
  Filled 2013-06-12 (×4): qty 1

## 2013-06-12 MED ORDER — SODIUM CHLORIDE 0.9 % IJ SOLN
3.0000 mL | Freq: Two times a day (BID) | INTRAMUSCULAR | Status: DC
  Administered 2013-06-12 – 2013-06-13 (×2): 3 mL via INTRAVENOUS

## 2013-06-12 MED ORDER — CYCLOBENZAPRINE HCL 10 MG PO TABS: 10.0000 mg | ORAL_TABLET | Freq: Three times a day (TID) | ORAL | Status: DC | PRN

## 2013-06-12 MED ORDER — IMIPRAMINE HCL 25 MG PO TABS
50.0000 mg | ORAL_TABLET | Freq: Every day | ORAL | Status: DC
  Administered 2013-06-12 – 2013-06-14 (×3): 50 mg via ORAL
  Filled 2013-06-12 (×5): qty 2

## 2013-06-12 MED ORDER — SODIUM CHLORIDE 0.9 % IV SOLN
INTRAVENOUS | Status: AC
  Administered 2013-06-12: 15:00:00 via INTRAVENOUS

## 2013-06-12 NOTE — ED Notes (Addendum)
Pt reports redness and swelling to face and bilateral eyes that started yesterday. Pt reports was seen at urgent care yesterday for same. Pt reports was d/c with dx of sinusitis. Pt reports redness and inflammation got worse last night and continues today. Pt reports received prednisone and rocephin IM yesterday and has px for keflex and hydrocodone.nad noted. Airway patent. Pt denies any sob. Pt denies any new skin care products.

## 2013-06-12 NOTE — ED Notes (Signed)
Swelling to face/eyes/cheeks has decreased since arrival to ED.   Pt reports is feeling better.

## 2013-06-12 NOTE — H&P (Addendum)
[facility] History and Physical  Lisa Parrish [id number] DOB: [birth year] DOA: 06/12/2013  Referring physician:  Debroah Baller PCP:  Redge Gainer, MD   Chief Complaint:  Face redness  HPI:  The patient is a 53 y.o. year-old female with history of hypertension, hyperlipidemia, depression, anxiety, seasonal allergies, osteoarthritis who presents with redness of the face.  The patient was last at their baseline health 2 days ago.  She states that she has been suffering from seasonal allergies which is typical for this time of year. She has had some mucousy discharge from her nose, but no sore throat or fevers. Yesterday morning, she developed the pimple on the right nasolabial fold.  By midday yesterday, she had had redness, tenseness, warm, and pain which had spread from that right nasolabial fold area across her nasal bridge into both cheeks. The bridge of her nose developed some ulcerations and started oozing some serous, somewhat worried material. She was seen in urgent care and given a shot of ceftriaxone, prescription for Keflex and prednisone. She states that around this at her face had improved by this morning, however when she awoke her eyes were nearly swollen shut, and her face was very puffy. She came to the emergency department. Since getting up this morning, the swelling in her face is dramatically improved.    In the emergency department, her white blood cell count 10.4, hemoglobin 11.4, 3134, potassium 3.6, chloride 95. CT of the sinuses demonstrated paranasal cellulitis without discrete abscess and acute sinusitis with air-fluid level in the right maxillary sinus with mucoperiosteal thickening in the left maxillary and ethmoid sinuses. She is being admitted for IV antibiotics for facial cellulitis and sinusitis. She was given a dose of vancomycin in the emergency department.  Review of Systems:  General:  Denies fevers, chills, weight loss or gain HEENT:  Denies changes  to hearing and vision, positive rhinorrhea, sinus congestion, denies sore throat, having some left neck pain CV:  Denies chest pain and palpitations, lower extremity edema.  PULM:  Denies SOB, wheezing, cough.   GI:  Denies nausea, vomiting, constipation, diarrhea.   GU:  Denies dysuria, frequency, urgency ENDO:  Denies polyuria, polydipsia.   HEME:  Denies hematemesis, blood in stools, melena, abnormal bruising or bleeding.  LYMPH:  Denies lymphadenopathy.   MSK:  Denies arthralgias, myalgias.   DERM:  Denies skin rash or ulcer.   NEURO:  Denies focal numbness, weakness, slurred speech, confusion, facial droop.  PSYCH:  Denies active anxiety and depression.  Denies SI  Past Medical History  Diagnosis Date  . Osteoarthritis     back  . Hyperlipidemia   . Hypertension   . Headache, migraine   . Anxiety   . Seasonal allergies   . Depression   . Kidney stones    Past Surgical History  Procedure Laterality Date  . Hemorrhoid surgery  1989  . Tubal ligation  1990  . Lumbar disckectomy  2006   Social History:  reports that she quit smoking about 21 years ago. Her smoking use included Cigarettes. She has a 10 pack-year smoking history. She has never used smokeless tobacco. She reports that she does not drink alcohol or use illicit drugs. Married, works as a Radiation protection practitioner.    Allergies  Allergen Reactions  . Morphine And Related Nausea And Vomiting    Family History  Problem Relation Age of Onset  . Colon cancer Paternal Aunt 61    father's half sister  . Colon  cancer Paternal Uncle 19    father's half brother  . Cancer Mother   . Heart disease Father      Prior to Admission medications   Medication Sig Start Date End Date Taking? Authorizing Provider  buPROPion (WELLBUTRIN XL) 300 MG 24 hr tablet Take 1 tablet (300 mg total) by mouth daily. 12/20/12  Yes Mae Loree Fee, FNP  cephALEXin (KEFLEX) 500 MG capsule Take 500 mg by mouth 4 (four) times daily.  06/11/13  Yes Historical Provider, MD  cyclobenzaprine (FLEXERIL) 10 MG tablet Take 10 mg by mouth 3 (three) times daily as needed.   Yes Historical Provider, MD  HYDROcodone-acetaminophen (NORCO/VICODIN) 5-325 MG per tablet Take 1 tablet by mouth every 4 (four) hours as needed for moderate pain (back pain).    Yes Historical Provider, MD  imipramine (TOFRANIL) 25 MG tablet Take 50 mg by mouth at bedtime.   Yes Historical Provider, MD  metoprolol succinate (TOPROL-XL) 50 MG 24 hr tablet Take 50 mg by mouth daily. Take with or immediately following a meal.   Yes Historical Provider, MD  pravastatin (PRAVACHOL) 10 MG tablet Take 10 mg by mouth at bedtime. 08/24/12  Yes Lodema Pilot, PA-C  azelastine (OPTIVAR) 0.05 % ophthalmic solution One drop in each eye daily 12/22/12   Erby Pian, FNP   Physical Exam: Filed Vitals:   06/12/13 0916 06/12/13 0918 06/12/13 1237  BP:  132/85 125/78  Pulse:  83 85  Temp:  98.3 F (36.8 C) 98.3 F (36.8 C)  TempSrc:  Oral Oral  Resp:  18 16  Height: 5\' 6"  (1.676 m)    Weight: 77.111 kg (170 lb)    SpO2:  100% 100%     General:  Caucasian female, no acute distress  Eyes:  PERRL, anicteric, bilateral eyes mildly injected  ENT:  Nares with swollen turbinates, mucous discharge.  OP clear, non-erythematous without plaques or exudates.  MMM.  Neck:  Supple without TM or JVD.    Lymph:  Bilateral shotty and tender cervical lymphadenopathy, very tender submandibular lymph nodes. No palpable supraclavicular lymph nodes.   Cardiovascular:  RRR, normal S1, S2, without m/r/g.  2+ pulses, warm extremities  Respiratory:  CTA bilaterally without increased WOB.  Abdomen:  NABS.  Soft, ND/NT.    Skin:  No rashes or focal lesions.  Musculoskeletal:  Normal bulk and tone.  No LE edema.  Psychiatric:  A & O x 4.  Appropriate affect.  Neurologic:  CN 3-12 intact.  5/5 strength.  Sensation intact.  Skin: Malar distribution of rash which does also  include the nasal labial folds. The skin is red but not brightly pink and the bridge of her nose is covered with some bubbled up scan almost vesicular or bullous in appearance using honey-colored serous material.  Labs on Admission:  Basic Metabolic Panel:  Recent Labs Lab 06/12/13 0954  NA 134*  K 3.6*  CL 95*  CO2 29  GLUCOSE 104*  BUN 15  CREATININE 0.66  CALCIUM 9.3   Liver Function Tests: No results found for this basename: AST, ALT, ALKPHOS, BILITOT, PROT, ALBUMIN,  in the last 168 hours No results found for this basename: LIPASE, AMYLASE,  in the last 168 hours No results found for this basename: AMMONIA,  in the last 168 hours CBC:  Recent Labs Lab 06/12/13 0954  WBC 10.4  NEUTROABS 8.3*  HGB 11.4*  HCT 34.6*  MCV 87.8  PLT 252   Cardiac Enzymes: No results  found for this basename: CKTOTAL, CKMB, CKMBINDEX, TROPONINI,  in the last 168 hours  BNP (last 3 results) No results found for this basename: PROBNP,  in the last 8760 hours CBG: No results found for this basename: GLUCAP,  in the last 168 hours  Radiological Exams on Admission: Ct Maxillofacial W/cm  06/12/2013   CLINICAL DATA:  Clinical cellulitis in the perinasal soft tissues greater on the left than on the right.  EXAM: CT MAXILLOFACIAL WITH CONTRAST  TECHNIQUE: Multidetector CT imaging of the maxillofacial structures was performed with intravenous contrast. Multiplanar CT image reconstructions were also generated. A small metallic BB was placed on the right temple in order to reliably differentiate right from left.  CONTRAST:  12mL OMNIPAQUE IOHEXOL 300 MG/ML  SOLN  COMPARISON:  None.  FINDINGS: There is increased soft tissue density diffusely over the nose greater on the left than on the right especially at the base of the nose. There is no discrete drainable abscess. No soft tissue gas collections are demonstrated. There is mildly increased density in the medial aspect of the left malar soft tissues  consistent with inflammation. The nasal bones are intact. The nasal septum is midline. The bony orbits and the paranasal sinuses are intact. The pterygoid plates are intact. The temporomandibular joints appear normal. The mandible and maxilla are normal.  There is an air-fluid level in the right maxillary sinus. There is mucoperiosteal thickening within several ethmoid sinus cells and of the left maxillary sinus. The sphenoid and frontal sinuses are clear. The orbital soft tissues exhibit no abnormal enhancement. There is no pre or postseptal edema.  The parotid and submandibular glands are normal in appearance. There are few normal-sized to mildly enlarged submandibular lymph nodes especially on the left. The tonsils and adenoids appear normal.  IMPRESSION: 1. The findings are consistent with paranasal cellulitis. There is no discrete drainable abscess. 2. There is likely acute sinusitis manifested by an air-fluid level in the right maxillary sinus and mucoperiosteal thickening in the left maxillary and ethmoid sinuses.   Electronically Signed   By: David  Martinique   On: 06/12/2013 11:13    EKG: Pending  Assessment/Plan Active Problems:   HTN (hypertension)   Other and unspecified hyperlipidemia   Anxiety state, unspecified   Facial cellulitis   Acute sinusitis   Chronic pain  ---  Facial cellulitis, with likely improving since the redness of her face improved after her shot of ceftriaxone yesterday. The swelling of her face was probably dependent edema and has improved because she has been sitting up today. Most likely culprits would be staph and strep although respiratory flora may also be possible given her acute sinusitis. -  Clindamycin  -  Followup blood cultures -  MRSA swab  Left neck pain, likely related to enlarged lymph nodes seen on CT -  Consider CT or [place] if progressing  Hypertension and hyperlipidemia, stable, continue home medications  Depression and anxiety, stable continue  Wellbutrin  Chronic pain due to osteoarthritis, continue home medications  Normocytic anemia, may be some marrow suppression from acute illness -  Defer to primary care Doctor  Hyponatremia and hypochloremia, due to dehydration -  Start IV fluids  Hypokalemia, likely due to dehydration -  Oral potassium repletion   Diet:  Healthy heart Access:  PIV IVF:  Yes Proph:  Lovenox  Code Status: Full Family Communication: Patient and her husband Disposition Plan: Admit to med surge  Time spent: 60 min Winifred Olive  [phone]  If 7PM-7AM, please contact night-coverage [email or web address] Password [email or web address] 06/12/2013, 1:26 PM

## 2013-06-12 NOTE — ED Provider Notes (Signed)
CSN: [id number]     Arrival date & time 06/12/13  0904 History  This chart was scribed for non-physician practitioner, Debroah Baller, FNP,working with Maudry Diego, MD, by Marlowe Kays, ED Scribe.  This patient was seen in room APA04/APA04 and the patient's care was started at 9:30 AM.  Chief Complaint  Patient presents with  . Cellulitis   The history is provided by the patient. No language interpreter was used.   HPI Comments:  Lisa Parrish is a 53 y.o. female who presents to the Emergency Department complaining of worsening painful redness and swelling of the face and bilateral eyes onset yesterday. She reports associated congestion and sinus pressure. Pt reports the pain as 5/10. She states upon waking yesterday morning with a pimple on her nose that spread into red patches on her cheeks. Pt reports being seen at the urgent care center yesterday and was diagnosed with sinusitis and was treated with an IM injection of Rocephin and Prednisone. She was prescribed Keflex and Lortab 5/325 mg on discharge. She denies fever, visual changes, otalgia, SOB, back pain, urinary symptoms, abdominal pain, nausea, vomiting, or joint swelling.  Past Medical History  Diagnosis Date  . Osteoarthritis     back  . Hyperlipidemia   . Hypertension   . Headache, migraine   . Anxiety   . Seasonal allergies   . Depression   . Kidney stones    Past Surgical History  Procedure Laterality Date  . Hemorrhoid surgery  1989  . Tubal ligation  1990  . Lumbar disckectomy  2006   Family History  Problem Relation Age of Onset  . Colon cancer Paternal Aunt 58    father's half sister  . Colon cancer Paternal Uncle 55    father's half brother  . Cancer Mother   . Heart disease Father    History  Substance Use Topics  . Smoking status: Former Smoker    Quit date: 09/18/1991  . Smokeless tobacco: Never Used  . Alcohol Use: No   OB History   Grav Para Term Preterm Abortions TAB SAB Ect Mult  Living                 Review of Systems  Constitutional: Negative for fever.  HENT: Positive for congestion. Negative for ear pain.   Eyes: Negative for visual disturbance.  Respiratory: Negative for shortness of breath.   Gastrointestinal: Negative for nausea, vomiting and abdominal pain.  Genitourinary: Negative for dysuria, frequency, hematuria and difficulty urinating.  Musculoskeletal: Negative for back pain and joint swelling.  Skin: Positive for color change (redness of the nose and bilateral cheeks).    Allergies  Morphine and related  Home Medications   Current Outpatient Rx  Name  Route  Sig  Dispense  Refill  . azelastine (OPTIVAR) 0.05 % ophthalmic solution      One drop in each eye daily   6 mL   12   . buPROPion (WELLBUTRIN XL) 300 MG 24 hr tablet   Oral   Take 1 tablet (300 mg total) by mouth daily.   90 tablet   0   . cyclobenzaprine (FLEXERIL) 10 MG tablet   Oral   Take 10 mg by mouth 3 (three) times daily as needed.         Marland Kitchen HYDROcodone-acetaminophen (NORCO/VICODIN) 5-325 MG per tablet   Oral   Take 1-2 tablets by mouth every 4 (four) hours as needed for moderate pain (back pain).         Marland Kitchen  imipramine (TOFRANIL) 25 MG tablet   Oral   Take 50 mg by mouth at bedtime.         . metoprolol (LOPRESSOR) 50 MG tablet   Oral   Take 1 tablet (50 mg total) by mouth daily.   90 tablet   2   . pravastatin (PRAVACHOL) 10 MG tablet   Oral   Take 1 tablet (10 mg total) by mouth daily.   90 tablet   2     No more refills until patient is seen    Triage Vitals: BP 132/85  Pulse 83  Temp(Src) 98.3 F (36.8 C) (Oral)  Resp 18  Ht 5\' 6"  (1.676 m)  Wt 170 lb (77.111 kg)  BMI 27.45 kg/m2  SpO2 100% Physical Exam  Nursing note and vitals reviewed. Constitutional: She is oriented to person, place, and time. She appears well-developed and well-nourished.  HENT:  Head:    Right Ear: Tympanic membrane and ear canal normal.  Left Ear:  Tympanic membrane and ear canal normal.  Nose: Right sinus exhibits maxillary sinus tenderness. Left sinus exhibits maxillary sinus tenderness.  Mouth/Throat: Uvula is midline, oropharynx is clear and moist and mucous membranes are normal. No oropharyngeal exudate, posterior oropharyngeal edema, posterior oropharyngeal erythema or tonsillar abscesses.  Swelling of the nose and both cheeks with erythema and small open lesion to right side of nose. Bridge of the nose has weeping.  Eyes: Conjunctivae and EOM are normal. Pupils are equal, round, and reactive to light.  Neck: Normal range of motion. Neck supple.  Cardiovascular: Normal rate, regular rhythm and normal heart sounds.  Exam reveals no gallop and no friction rub.   No murmur heard. Pulmonary/Chest: Effort normal. No respiratory distress. She has no wheezes. She has no rales.  Abdominal: Soft. Bowel sounds are normal. There is no tenderness.  Lymphadenopathy:    She has cervical adenopathy (anterior).  Neurological: She is alert and oriented to person, place, and time.  Skin: Skin is warm and dry.  Psychiatric: She has a normal mood and affect. Her behavior is normal.    ED Course  Procedures (including critical care time) DIAGNOSTIC STUDIES: Oxygen Saturation is 100% on RA, normal by my interpretation.  Labs, CT, IV Vancomycin  COORDINATION OF CARE: 9:37 AM- Will speak with Dr. Roderic Palau about appropriate course of treatment. Will order pain medication. Pt verbalizes understanding and agrees to plan.  Medications  0.9 %  sodium chloride infusion (not administered)  vancomycin (VANCOCIN) IVPB 1000 mg/200 mL premix (0 mg Intravenous Stopped 06/12/13 1135)  iohexol (OMNIPAQUE) 300 MG/ML solution 80 mL (80 mLs Intravenous Contrast Given 06/12/13 1050)   Results for orders placed during the hospital encounter of 06/12/13 (from the past 24 hour(s))  CBC WITH DIFFERENTIAL     Status: Abnormal   Collection Time    06/12/13  9:54 AM       Result Value Ref Range   WBC 10.4  4.0 - 10.5 K/uL   RBC 3.94  3.87 - 5.11 MIL/uL   Hemoglobin 11.4 (*) 12.0 - 15.0 g/dL   HCT 34.6 (*) 36.0 - 46.0 %   MCV 87.8  78.0 - 100.0 fL   MCH 28.9  26.0 - 34.0 pg   MCHC 32.9  30.0 - 36.0 g/dL   RDW 13.3  11.5 - 15.5 %   Platelets 252  150 - 400 K/uL   Neutrophils Relative % 80 (*) 43 - 77 %   Neutro Abs 8.3 (*) 1.7 -  7.7 K/uL   Lymphocytes Relative 12  12 - 46 %   Lymphs Abs 1.2  0.7 - 4.0 K/uL   Monocytes Relative 8  3 - 12 %   Monocytes Absolute 0.8  0.1 - 1.0 K/uL   Eosinophils Relative 0  0 - 5 %   Eosinophils Absolute 0.0  0.0 - 0.7 K/uL   Basophils Relative 0  0 - 1 %   Basophils Absolute 0.0  0.0 - 0.1 K/uL  BASIC METABOLIC PANEL     Status: Abnormal   Collection Time    06/12/13  9:54 AM      Result Value Ref Range   Sodium 134 (*) 137 - 147 mEq/L   Potassium 3.6 (*) 3.7 - 5.3 mEq/L   Chloride 95 (*) 96 - 112 mEq/L   CO2 29  19 - 32 mEq/L   Glucose, Bld 104 (*) 70 - 99 mg/dL   BUN 15  6 - 23 mg/dL   Creatinine, Ser 0.66  0.50 - 1.10 mg/dL   Calcium 9.3  8.4 - 10.5 mg/dL   GFR calc non Af Amer >90  >90 mL/min   GFR calc Af Amer >90  >90 mL/min  CULTURE, BLOOD (ROUTINE X 2)     Status: None   Collection Time    06/12/13  9:54 AM      Result Value Ref Range   Specimen Description BLOOD RIGHT ARM     Special Requests       Value: BOTTLES DRAWN AEROBIC AND ANAEROBIC 10 CC EACH BOTTLE   Culture PENDING     Report Status PENDING    CULTURE, BLOOD (ROUTINE X 2)     Status: None   Collection Time    06/12/13 10:07 AM      Result Value Ref Range   Specimen Description BLOOD LEFT ARM     Special Requests       Value: BOTTLES DRAWN AEROBIC AND ANAEROBIC 8CC EACH BOTTLE   Culture PENDING     Report Status PENDING      Imaging Review Ct Maxillofacial W/cm  06/12/2013   CLINICAL DATA:  Clinical cellulitis in the perinasal soft tissues greater on the left than on the right.  EXAM: CT MAXILLOFACIAL WITH CONTRAST  TECHNIQUE:  Multidetector CT imaging of the maxillofacial structures was performed with intravenous contrast. Multiplanar CT image reconstructions were also generated. A small metallic BB was placed on the right temple in order to reliably differentiate right from left.  CONTRAST:  49mL OMNIPAQUE IOHEXOL 300 MG/ML  SOLN  COMPARISON:  None.  FINDINGS: There is increased soft tissue density diffusely over the nose greater on the left than on the right especially at the base of the nose. There is no discrete drainable abscess. No soft tissue gas collections are demonstrated. There is mildly increased density in the medial aspect of the left malar soft tissues consistent with inflammation. The nasal bones are intact. The nasal septum is midline. The bony orbits and the paranasal sinuses are intact. The pterygoid plates are intact. The temporomandibular joints appear normal. The mandible and maxilla are normal.  There is an air-fluid level in the right maxillary sinus. There is mucoperiosteal thickening within several ethmoid sinus cells and of the left maxillary sinus. The sphenoid and frontal sinuses are clear. The orbital soft tissues exhibit no abnormal enhancement. There is no pre or postseptal edema.  The parotid and submandibular glands are normal in appearance. There are few normal-sized to mildly  enlarged submandibular lymph nodes especially on the left. The tonsils and adenoids appear normal.  IMPRESSION: 1. The findings are consistent with paranasal cellulitis. There is no discrete drainable abscess. 2. There is likely acute sinusitis manifested by an air-fluid level in the right maxillary sinus and mucoperiosteal thickening in the left maxillary and ethmoid sinuses.   Electronically Signed   By: David  Martinique   On: 06/12/2013 11:13    MDM: Dr. Roderic Palau in to examine the patient.  53 y.o. female with pain, erythema, swelling of the face. Weeping area of the nasal bridge and small lesion to the right nostril. Symptoms  much worse today even with treatment of Rocephin and Keflex yesterday.  CT results show paranasal cellulitis and sinusitis of left maxillary and ethmoid.  I have reviewed this patient's vital signs, nurses notes, appropriate labs and imaging.   Consult with [facility] for admission. Dr. Sheran Fava to admit  I personally performed the services described in this documentation, which was scribed in my presence. The recorded information has been reviewed and is accurate.    [place], NP 06/12/13 1255

## 2013-06-12 NOTE — ED Provider Notes (Signed)
Medical screening examination/treatment/procedure(s) were conducted as a shared visit with non-physician practitioner(s) and myself.  I personally evaluated the patient during the encounter.   EKG Interpretation None      Pt with rash to face.  pe cellulitis to face  Maudry Diego, MD 06/12/13 1504

## 2013-06-13 LAB — BASIC METABOLIC PANEL
BUN: 14 mg/dL (ref 6–23)
CO2: 28 mEq/L (ref 19–32)
Calcium: 8.8 mg/dL (ref 8.4–10.5)
Chloride: 100 mEq/L (ref 96–112)
Creatinine, Ser: 0.61 mg/dL (ref 0.50–1.10)
GFR calc Af Amer: >90 mL/min (ref >90)
GFR calc non Af Amer: >90 mL/min (ref >90)
Glucose, Bld: 109 mg/dL — ABNORMAL HIGH (ref 70–99)
Potassium: 3.8 mEq/L (ref 3.7–5.3)
Sodium: 138 mEq/L (ref 137–147)

## 2013-06-13 LAB — CBC
HCT: 32.7 % — ABNORMAL LOW (ref 36.0–46.0)
Hemoglobin: 10.8 g/dL — ABNORMAL LOW (ref 12.0–15.0)
MCH: 29 pg (ref 26.0–34.0)
MCHC: 33 g/dL (ref 30.0–36.0)
MCV: 87.9 fL (ref 78.0–100.0)
Platelets: 218 10*3/uL (ref 150–400)
RBC: 3.72 MIL/uL — ABNORMAL LOW (ref 3.87–5.11)
RDW: 13.6 % (ref 11.5–15.5)
WBC: 6.5 10*3/uL (ref 4.0–10.5)

## 2013-06-13 MED ORDER — DIPHENHYDRAMINE HCL 25 MG PO CAPS
25.0000 mg | ORAL_CAPSULE | Freq: Four times a day (QID) | ORAL | Status: DC | PRN
  Administered 2013-06-13 – 2013-06-14 (×3): 25 mg via ORAL
  Filled 2013-06-13 (×3): qty 1

## 2013-06-13 NOTE — Progress Notes (Signed)
UR completed. Patient changed to inpatient- requiring IV anitbiotics

## 2013-06-13 NOTE — Discharge Summary (Signed)
Physician Discharge Summary  Lisa Parrish [id number] DOB: [birth year] DOA: 06/12/2013  PCP: Redge Gainer, MD  Admit date: 06/12/2013 Discharge date: 06/14/2013  Recommendations for Outpatient Follow-up:  1. Followup with primary care doctor in one week for reexamination.  Followup on pending blood cultures.  Repeat CBC in a few weeks. If anemia persists, consider workup for anemia if not already complete.  Discharge Diagnoses:  Active Problems:   HTN (hypertension)   Other and unspecified hyperlipidemia   Anxiety state, unspecified   Facial cellulitis   Acute sinusitis   Chronic pain   Discharge Condition: Stable, improved  Diet recommendation: Low-salt  Wt Readings from Last 3 Encounters:  06/12/13 77.1 kg (169 lb 15.6 oz)  02/24/13 77.111 kg (170 lb)  08/24/12 79.379 kg (175 lb)    History of present illness:   The patient is a 53 y.o. year-old female with history of hypertension, hyperlipidemia, depression, anxiety, seasonal allergies, osteoarthritis who presents with redness of the face. The patient was last at their baseline health 2 days ago. She states that she has been suffering from seasonal allergies which is typical for this time of year. She has had some mucousy discharge from her nose, but no sore throat or fevers. Yesterday morning, she developed the pimple on the right nasolabial fold. By midday yesterday, she had had redness, tenseness, warm, and pain which had spread from that right nasolabial fold area across her nasal bridge into both cheeks. The bridge of her nose developed some ulcerations and started oozing some serous, somewhat worried material. She was seen in urgent care and given a shot of ceftriaxone, prescription for Keflex and prednisone. She states that around this at her face had improved by this morning, however when she awoke her eyes were nearly swollen shut, and her face was very puffy. She came to the emergency department. Since getting up  this morning, the swelling in her face is dramatically improved.  In the emergency department, her white blood cell count 10.4, hemoglobin 11.4, 3134, potassium 3.6, chloride 95. CT of the sinuses demonstrated paranasal cellulitis without discrete abscess and acute sinusitis with air-fluid level in the right maxillary sinus with mucoperiosteal thickening in the left maxillary and ethmoid sinuses. She is being admitted for IV antibiotics for facial cellulitis and sinusitis. She was given a dose of vancomycin in the emergency department.   Hospital Course:   Facial cellulitis and acute sinusitis.  Started on clindamycin to cover both facial cellulitis and acute sinusitis. She had gradual improvement in erythema and induration of her face. Her blood cultures remained no growth to date. MRSA swab was negative. She is advised to continue oral clindamycin at home to complete a 14-day course.    Left neck pain, likely related to enlarged lymph nodes seen on CT and improved with IV abx.  Hypertension and hyperlipidemia, stable, continued home medications. Depression and anxiety, stable continued Wellbutrin. Chronic pain due to osteoarthritis, continued home medications  Normocytic anemia, may be some marrow suppression from acute illness.  Defer to primary care Doctor. Hyponatremia and hypochloremia, due to dehydration, resolved with IV fluids.   Hypokalemia, likely due to dehydration, resolved with oral potassium repletion.     Consultants:  None Procedures:  CT sinuses Antibiotics:  Vancomycin 4/12x1  Clindamycin 4/12 >>>   Discharge Exam: Filed Vitals:   06/14/13 1033  BP: 112/72  Pulse: 86  Temp:   Resp:    Filed Vitals:   06/13/13 1500 06/13/13 2040 06/14/13 0444  06/14/13 1033  BP: 112/70 107/62 117/69 112/72  Pulse: 88 83 71 86  Temp: 97.9 F (36.6 C) 98 F (36.7 C) 97.5 F (36.4 C)   TempSrc: Oral Oral Oral   Resp: 18 18 18    Height:      Weight:      SpO2: 100% 97% 99%      General: Caucasian female, No acute distress  HEENT: MMM, malar distribution of erythema with involvement of the nasolabial folds. Improved erythema and induration of the right cheek and no residual erythema of the right eyelid. Injection of the right eye improved. Nasal bridge still with honey crusting and some serous discharge. Left eye with some mild swelling of the lids and mild injection, persistent erythema and induration of the left cheek with some extension in the inferior margin.  Cardiovascular: RRR, nl S1, S2 no mrg, 2+ pulses, warm extremities  Respiratory: CTAB, no increased WOB  Abdomen: NABS, soft, NT/ND  MSK: Normal tone and bulk, no LEE  Neuro: Grossly intact   Discharge Instructions      Discharge Orders   Future Orders Complete By Expires   Call MD for:  difficulty breathing, headache or visual disturbances  As directed    Call MD for:  extreme fatigue  As directed    Call MD for:  hives  As directed    Call MD for:  persistant dizziness or light-headedness  As directed    Call MD for:  persistant nausea and vomiting  As directed    Call MD for:  severe uncontrolled pain  As directed    Call MD for:  temperature >100.4  As directed    Diet - low sodium heart healthy  As directed    Discharge instructions  As directed    Increase activity slowly  As directed        Medication List    STOP taking these medications       cephALEXin 500 MG capsule  Commonly known as:  KEFLEX      TAKE these medications       azelastine 0.05 % ophthalmic solution  Commonly known as:  OPTIVAR  One drop in each eye daily     buPROPion 300 MG 24 hr tablet  Commonly known as:  WELLBUTRIN XL  Take 1 tablet (300 mg total) by mouth daily.     clindamycin 300 MG capsule  Commonly known as:  CLEOCIN  Take 2 capsules (600 mg total) by mouth 3 (three) times daily.     cyclobenzaprine 10 MG tablet  Commonly known as:  FLEXERIL  Take 10 mg by mouth 3 (three) times daily as  needed.     HYDROcodone-acetaminophen 5-325 MG per tablet  Commonly known as:  NORCO/VICODIN  Take 1 tablet by mouth every 4 (four) hours as needed for moderate pain (back pain).     imipramine 25 MG tablet  Commonly known as:  TOFRANIL  Take 50 mg by mouth at bedtime.     metoprolol succinate 50 MG 24 hr tablet  Commonly known as:  TOPROL-XL  Take 50 mg by mouth daily. Take with or immediately following a meal.     pravastatin 10 MG tablet  Commonly known as:  PRAVACHOL  Take 10 mg by mouth at bedtime.     saccharomyces boulardii 250 MG capsule  Commonly known as:  FLORASTOR  Take 1 capsule (250 mg total) by mouth 2 (two) times daily.  Follow-up Information   Follow up with Redge Gainer, MD. Schedule an appointment as soon as possible for a visit in 1 week.   Specialty:  Family Medicine   Contact information:   [address] [place] [address] [phone]       The results of significant diagnostics from this hospitalization (including imaging, microbiology, ancillary and laboratory) are listed below for reference.    Significant Diagnostic Studies: Ct Maxillofacial W/cm  06/12/2013   CLINICAL DATA:  Clinical cellulitis in the perinasal soft tissues greater on the left than on the right.  EXAM: CT MAXILLOFACIAL WITH CONTRAST  TECHNIQUE: Multidetector CT imaging of the maxillofacial structures was performed with intravenous contrast. Multiplanar CT image reconstructions were also generated. A small metallic BB was placed on the right temple in order to reliably differentiate right from left.  CONTRAST:  20mL OMNIPAQUE IOHEXOL 300 MG/ML  SOLN  COMPARISON:  None.  FINDINGS: There is increased soft tissue density diffusely over the nose greater on the left than on the right especially at the base of the nose. There is no discrete drainable abscess. No soft tissue gas collections are demonstrated. There is mildly increased density in the medial aspect of the left malar  soft tissues consistent with inflammation. The nasal bones are intact. The nasal septum is midline. The bony orbits and the paranasal sinuses are intact. The pterygoid plates are intact. The temporomandibular joints appear normal. The mandible and maxilla are normal.  There is an air-fluid level in the right maxillary sinus. There is mucoperiosteal thickening within several ethmoid sinus cells and of the left maxillary sinus. The sphenoid and frontal sinuses are clear. The orbital soft tissues exhibit no abnormal enhancement. There is no pre or postseptal edema.  The parotid and submandibular glands are normal in appearance. There are few normal-sized to mildly enlarged submandibular lymph nodes especially on the left. The tonsils and adenoids appear normal.  IMPRESSION: 1. The findings are consistent with paranasal cellulitis. There is no discrete drainable abscess. 2. There is likely acute sinusitis manifested by an air-fluid level in the right maxillary sinus and mucoperiosteal thickening in the left maxillary and ethmoid sinuses.   Electronically Signed   By: David  Martinique   On: 06/12/2013 11:13    Microbiology: Recent Results (from the past 240 hour(s))  CULTURE, BLOOD (ROUTINE X 2)     Status: None   Collection Time    06/12/13  9:54 AM      Result Value Ref Range Status   Specimen Description BLOOD RIGHT ARM   Final   Special Requests     Final   Value: BOTTLES DRAWN AEROBIC AND ANAEROBIC 10 CC EACH BOTTLE   Culture NO GROWTH 2 DAYS   Final   Report Status PENDING   Incomplete  CULTURE, BLOOD (ROUTINE X 2)     Status: None   Collection Time    06/12/13 10:07 AM      Result Value Ref Range Status   Specimen Description BLOOD LEFT ARM   Final   Special Requests     Final   Value: BOTTLES DRAWN AEROBIC AND ANAEROBIC 8CC EACH BOTTLE   Culture NO GROWTH 2 DAYS   Final   Report Status PENDING   Incomplete  MRSA PCR SCREENING     Status: None   Collection Time    06/12/13  5:31 PM       Result Value Ref Range Status   MRSA by PCR NEGATIVE  NEGATIVE  Final   Comment:            The GeneXpert MRSA Assay (FDA     approved for NASAL specimens     only), is one component of a     comprehensive MRSA colonization     surveillance program. It is not     intended to diagnose MRSA     infection nor to guide or     monitor treatment for     MRSA infections.     Labs: Basic Metabolic Panel:  Recent Labs Lab 06/12/13 0954 06/13/13 0454  NA 134* 138  K 3.6* 3.8  CL 95* 100  CO2 29 28  GLUCOSE 104* 109*  BUN 15 14  CREATININE 0.66 0.61  CALCIUM 9.3 8.8   Liver Function Tests: No results found for this basename: AST, ALT, ALKPHOS, BILITOT, PROT, ALBUMIN,  in the last 168 hours No results found for this basename: LIPASE, AMYLASE,  in the last 168 hours No results found for this basename: AMMONIA,  in the last 168 hours CBC:  Recent Labs Lab 06/12/13 0954 06/13/13 0454  WBC 10.4 6.5  NEUTROABS 8.3*  --   HGB 11.4* 10.8*  HCT 34.6* 32.7*  MCV 87.8 87.9  PLT 252 218   Cardiac Enzymes: No results found for this basename: CKTOTAL, CKMB, CKMBINDEX, TROPONINI,  in the last 168 hours BNP: BNP (last 3 results) No results found for this basename: PROBNP,  in the last 8760 hours CBG: No results found for this basename: GLUCAP,  in the last 168 hours  Time coordinating discharge: 45 minutes  Signed:  Janece Canterbury  [facility] 06/14/2013, 10:44 AM

## 2013-06-13 NOTE — Progress Notes (Signed)
[facility] PROGRESS NOTE  Lisa Parrish BJY:[id number] DOB: [birth year] DOA: 06/12/2013 PCP: Redge Gainer, MD  Assessment/Plan  Facial cellulitis and acute sinusitis.  Improved on the right side, but somewhat worse on the left having more erythema and induration along the inferior cheek today - Continue Clindamycin  - blood cultures NGTD - MRSA swab neg  Left neck pain, likely related to enlarged lymph nodes seen on CT and improved with IV abx  Hypertension and hyperlipidemia, stable, continue home medications   Depression and anxiety, stable continue Wellbutrin   Chronic pain due to osteoarthritis, continue home medications   Normocytic anemia, may be some marrow suppression from acute illness  - Defer to primary care Doctor   Hyponatremia and hypochloremia, due to dehydration  - Start IV fluids   Hypokalemia, likely due to dehydration  - Oral potassium repletion   Diet: Healthy heart  Access: PIV  IVF: Yes  Proph: Lovenox  Code Status: Full  Family Communication: Patient and her husband  Disposition Plan: Admit to med surge  Consultants:  None  Procedures:  CT sinuses  Antibiotics:  Vancomycin 4/12x1  Clindamycin 4/12 >>>   HPI/Subjective:  Patient states that the redness has improved somewhat on the right face however she has increased redness and induration of the left lower cheek. Her eyes are less swollen today.    Objective: Filed Vitals:   06/12/13 1237 06/12/13 1340 06/12/13 2221 06/13/13 0702  BP: 125/78 135/86 102/60 110/66  Pulse: 85 87 83 84  Temp: 98.3 F (36.8 C) 98.3 F (36.8 C) 98.2 F (36.8 C) 97.7 F (36.5 C)  TempSrc: Oral Oral Oral Oral  Resp: 16 16 18 18   Height:  5\' 6"  (1.676 m)    Weight:  77.1 kg (169 lb 15.6 oz)    SpO2: 100% 99% 99% 99%    Intake/Output Summary (Last 24 hours) at 06/13/13 1131 Last data filed at 06/12/13 2114  Gross per 24 hour  Intake 740.91 ml  Output      0 ml  Net 740.91 ml    Filed Weights   06/12/13 0916 06/12/13 1340  Weight: 77.111 kg (170 lb) 77.1 kg (169 lb 15.6 oz)    Exam:   General:  Caucasian female, No acute distress  HEENT:  MMM, malar distribution of erythema with involvement of the nasolabial folds. Improved erythema and induration of the right cheek and no residual erythema of the right eyelid. Injection of the right eye improved. Nasal bridge still with honey crusting and some serous discharge. Left eye with some mild swelling of the lids and mild injection, persistent erythema and induration of the left cheek with some extension in the inferior margin.  Cardiovascular:  RRR, nl S1, S2 no mrg, 2+ pulses, warm extremities  Respiratory:  CTAB, no increased WOB  Abdomen:   NABS, soft, NT/ND  MSK:   Normal tone and bulk, no LEE  Neuro:  Grossly intact  Data Reviewed: Basic Metabolic Panel:  Recent Labs Lab 06/12/13 0954 06/13/13 0454  NA 134* 138  K 3.6* 3.8  CL 95* 100  CO2 29 28  GLUCOSE 104* 109*  BUN 15 14  CREATININE 0.66 0.61  CALCIUM 9.3 8.8   Liver Function Tests: No results found for this basename: AST, ALT, ALKPHOS, BILITOT, PROT, ALBUMIN,  in the last 168 hours No results found for this basename: LIPASE, AMYLASE,  in the last 168 hours No results found for this basename: AMMONIA,  in the last  168 hours CBC:  Recent Labs Lab 06/12/13 0954 06/13/13 0454  WBC 10.4 6.5  NEUTROABS 8.3*  --   HGB 11.4* 10.8*  HCT 34.6* 32.7*  MCV 87.8 87.9  PLT 252 218   Cardiac Enzymes: No results found for this basename: CKTOTAL, CKMB, CKMBINDEX, TROPONINI,  in the last 168 hours BNP (last 3 results) No results found for this basename: PROBNP,  in the last 8760 hours CBG: No results found for this basename: GLUCAP,  in the last 168 hours  Recent Results (from the past 240 hour(s))  CULTURE, BLOOD (ROUTINE X 2)     Status: None   Collection Time    06/12/13  9:54 AM      Result Value Ref Range Status   Specimen  Description BLOOD RIGHT ARM   Final   Special Requests     Final   Value: BOTTLES DRAWN AEROBIC AND ANAEROBIC 10 CC EACH BOTTLE   Culture NO GROWTH 1 DAY   Final   Report Status PENDING   Incomplete  CULTURE, BLOOD (ROUTINE X 2)     Status: None   Collection Time    06/12/13 10:07 AM      Result Value Ref Range Status   Specimen Description BLOOD LEFT ARM   Final   Special Requests     Final   Value: BOTTLES DRAWN AEROBIC AND ANAEROBIC 8CC EACH BOTTLE   Culture NO GROWTH 1 DAY   Final   Report Status PENDING   Incomplete  MRSA PCR SCREENING     Status: None   Collection Time    06/12/13  5:31 PM      Result Value Ref Range Status   MRSA by PCR NEGATIVE  NEGATIVE Final   Comment:            The GeneXpert MRSA Assay (FDA     approved for NASAL specimens     only), is one component of a     comprehensive MRSA colonization     surveillance program. It is not     intended to diagnose MRSA     infection nor to guide or     monitor treatment for     MRSA infections.     Studies: Ct Maxillofacial W/cm  06/12/2013   CLINICAL DATA:  Clinical cellulitis in the perinasal soft tissues greater on the left than on the right.  EXAM: CT MAXILLOFACIAL WITH CONTRAST  TECHNIQUE: Multidetector CT imaging of the maxillofacial structures was performed with intravenous contrast. Multiplanar CT image reconstructions were also generated. A small metallic BB was placed on the right temple in order to reliably differentiate right from left.  CONTRAST:  60mL OMNIPAQUE IOHEXOL 300 MG/ML  SOLN  COMPARISON:  None.  FINDINGS: There is increased soft tissue density diffusely over the nose greater on the left than on the right especially at the base of the nose. There is no discrete drainable abscess. No soft tissue gas collections are demonstrated. There is mildly increased density in the medial aspect of the left malar soft tissues consistent with inflammation. The nasal bones are intact. The nasal septum is  midline. The bony orbits and the paranasal sinuses are intact. The pterygoid plates are intact. The temporomandibular joints appear normal. The mandible and maxilla are normal.  There is an air-fluid level in the right maxillary sinus. There is mucoperiosteal thickening within several ethmoid sinus cells and of the left maxillary sinus. The sphenoid and frontal sinuses are clear. The orbital  soft tissues exhibit no abnormal enhancement. There is no pre or postseptal edema.  The parotid and submandibular glands are normal in appearance. There are few normal-sized to mildly enlarged submandibular lymph nodes especially on the left. The tonsils and adenoids appear normal.  IMPRESSION: 1. The findings are consistent with paranasal cellulitis. There is no discrete drainable abscess. 2. There is likely acute sinusitis manifested by an air-fluid level in the right maxillary sinus and mucoperiosteal thickening in the left maxillary and ethmoid sinuses.   Electronically Signed   By: David  Martinique   On: 06/12/2013 11:13    Scheduled Meds: . buPROPion  300 mg Oral Daily  . clindamycin (CLEOCIN) IV  600 mg Intravenous 3 times per day  . enoxaparin (LOVENOX) injection  40 mg Subcutaneous Q24H  . imipramine  50 mg Oral Daily  . metoprolol succinate  50 mg Oral Daily  . simvastatin  5 mg Oral q1800  . sodium chloride  3 mL Intravenous Q12H   Continuous Infusions:   Active Problems:   HTN (hypertension)   Other and unspecified hyperlipidemia   Anxiety state, unspecified   Facial cellulitis   Acute sinusitis   Chronic pain    Time spent: 30 min    [facility] Pager [phone]. If 7PM-7AM, please contact night-coverage at [email or web address], password [facility] 06/13/2013, 11:31 AM  LOS: 1 day

## 2013-06-14 MED ORDER — SACCHAROMYCES BOULARDII 250 MG PO CAPS
250.0000 mg | ORAL_CAPSULE | Freq: Two times a day (BID) | ORAL | Status: DC
Start: 2013-06-14 — End: 2013-08-29

## 2013-06-14 MED ORDER — CLINDAMYCIN HCL 300 MG PO CAPS: 600.0000 mg | ORAL_CAPSULE | Freq: Three times a day (TID) | ORAL | Status: DC

## 2013-06-14 NOTE — Progress Notes (Signed)
Pt is to be discharged home today. Pt is in NAD, IV is out, all paperwork has been reviewed/discussed with patient, and there are no questions/concerns at this time. Assessment is unchanged from this morning. Pt is to be accompanied downstairs by staff and family via wheelchair.  

## 2013-06-17 LAB — CULTURE, BLOOD (ROUTINE X 2)
Culture: NO GROWTH
Culture: NO GROWTH

## 2013-08-04 ENCOUNTER — Other Ambulatory Visit: Payer: Self-pay | Admitting: Physician Assistant

## 2013-08-05 NOTE — Telephone Encounter (Signed)
Called in and patient aware no more refills until seen

## 2013-08-05 NOTE — Telephone Encounter (Signed)
Patient last seen in office on 08-24-12. Please advise on refill

## 2013-08-05 NOTE — Telephone Encounter (Signed)
It is okay to refill this prescription x1. Please let the patient today she has not been seen in almost a year and needs to make an appointment to be seen for further medication.

## 2013-08-09 ENCOUNTER — Other Ambulatory Visit: Payer: Self-pay | Admitting: Physician Assistant

## 2013-08-22 ENCOUNTER — Telehealth: Payer: Self-pay | Admitting: Physician Assistant

## 2013-08-22 NOTE — Telephone Encounter (Signed)
Lm about meds

## 2013-08-23 ENCOUNTER — Ambulatory Visit: Payer: Medicare HMO | Admitting: Physician Assistant

## 2013-08-23 ENCOUNTER — Telehealth: Payer: Self-pay | Admitting: Physician Assistant

## 2013-08-23 NOTE — Telephone Encounter (Signed)
Appt given per patient request 

## 2013-08-29 ENCOUNTER — Encounter: Payer: Self-pay | Admitting: Family Medicine

## 2013-08-29 ENCOUNTER — Ambulatory Visit (INDEPENDENT_AMBULATORY_CARE_PROVIDER_SITE_OTHER): Payer: 59 | Admitting: Family Medicine

## 2013-08-29 VITALS — BP 108/75 | HR 76 | Temp 97.6°F | Ht 66.0 in | Wt 171.6 lb

## 2013-08-29 DIAGNOSIS — F3289 Other specified depressive episodes: Secondary | ICD-10-CM

## 2013-08-29 DIAGNOSIS — F32A Depression, unspecified: Secondary | ICD-10-CM

## 2013-08-29 DIAGNOSIS — E785 Hyperlipidemia, unspecified: Secondary | ICD-10-CM

## 2013-08-29 DIAGNOSIS — F329 Major depressive disorder, single episode, unspecified: Secondary | ICD-10-CM

## 2013-08-29 DIAGNOSIS — I10 Essential (primary) hypertension: Secondary | ICD-10-CM

## 2013-08-29 MED ORDER — METOPROLOL SUCCINATE ER 50 MG PO TB24: 50.0000 mg | ORAL_TABLET | Freq: Every day | ORAL | Status: DC

## 2013-08-29 MED ORDER — PRAVASTATIN SODIUM 10 MG PO TABS: 10.0000 mg | ORAL_TABLET | Freq: Every day | ORAL | Status: DC

## 2013-08-29 MED ORDER — BUPROPION HCL ER (XL) 300 MG PO TB24: ORAL_TABLET | ORAL | Status: DC

## 2013-08-29 NOTE — Progress Notes (Signed)
   Subjective:    Patient ID: Lisa Parrish, female    DOB: [birth year], 53 y.o.   MRN: [id number]  HPI  This 53 y.o. female presents for evaluation of hypertension and hyperlipidemia.  She would like to get refills on her depression med.  Review of Systems    No chest pain, SOB, HA, dizziness, vision change, N/V, diarrhea, constipation, dysuria, urinary urgency or frequency, myalgias, arthralgias or rash.  Objective:   Physical Exam  Vital signs noted  Well developed well nourished female.  HEENT - Head atraumatic Normocephalic                Eyes - PERRLA, Conjuctiva - clear Sclera- Clear EOMI                Ears - EAC's Wnl TM's Wnl Gross Hearing WNL                Nose - Nares patent                 Throat - oropharanx wnl Respiratory - Lungs CTA bilateral Cardiac - RRR S1 and S2 without murmur GI - Abdomen soft Nontender and bowel sounds active x 4 Extremities - No edema. Neuro - Grossly intact.      Assessment & Plan:  Other and unspecified hyperlipidemia - Plan: pravastatin (PRAVACHOL) 10 MG tablet, metoprolol succinate (TOPROL-XL) 50 MG 24 hr tablet, buPROPion (WELLBUTRIN XL) 300 MG 24 hr tablet  Depression - Plan: pravastatin (PRAVACHOL) 10 MG tablet, metoprolol succinate (TOPROL-XL) 50 MG 24 hr tablet, buPROPion (WELLBUTRIN XL) 300 MG 24 hr tablet  Essential hypertension, benign - Plan: pravastatin (PRAVACHOL) 10 MG tablet, metoprolol succinate (TOPROL-XL) 50 MG 24 hr tablet, buPROPion (WELLBUTRIN XL) 300 MG 24 hr tablet  Lysbeth Penner FNP

## 2014-01-03 ENCOUNTER — Ambulatory Visit: Payer: 59 | Admitting: Family Medicine

## 2014-01-03 ENCOUNTER — Telehealth: Payer: Self-pay | Admitting: Family Medicine

## 2014-01-03 NOTE — Telephone Encounter (Signed)
appt made

## 2014-02-02 ENCOUNTER — Telehealth: Payer: Self-pay | Admitting: Nurse Practitioner

## 2014-02-02 NOTE — Telephone Encounter (Signed)
Appointment scheduled for 12/9 with Bill.

## 2014-02-08 ENCOUNTER — Encounter: Payer: Self-pay | Admitting: Family Medicine

## 2014-02-08 ENCOUNTER — Ambulatory Visit (INDEPENDENT_AMBULATORY_CARE_PROVIDER_SITE_OTHER): Payer: 59 | Admitting: *Deleted

## 2014-02-08 ENCOUNTER — Ambulatory Visit (INDEPENDENT_AMBULATORY_CARE_PROVIDER_SITE_OTHER): Payer: 59 | Admitting: Family Medicine

## 2014-02-08 VITALS — BP 129/79 | HR 71 | Temp 97.3°F | Ht 66.0 in | Wt 174.0 lb

## 2014-02-08 DIAGNOSIS — Z23 Encounter for immunization: Secondary | ICD-10-CM

## 2014-02-08 DIAGNOSIS — E781 Pure hyperglyceridemia: Secondary | ICD-10-CM

## 2014-02-08 MED ORDER — AZELASTINE HCL 0.05 % OP SOLN: OPHTHALMIC | Status: DC

## 2014-02-08 NOTE — Patient Instructions (Signed)
Fat and Cholesterol Control Diet Fat and cholesterol levels in your blood and organs are influenced by your diet. High levels of fat and cholesterol may lead to diseases of the heart, small and large blood vessels, gallbladder, liver, and pancreas. CONTROLLING FAT AND CHOLESTEROL WITH DIET Although exercise and lifestyle factors are important, your diet is key. That is because certain foods are known to raise cholesterol and others to lower it. The goal is to balance foods for their effect on cholesterol and more importantly, to replace saturated and trans fat with other types of fat, such as monounsaturated fat, polyunsaturated fat, and omega-3 fatty acids. On average, a person should consume no more than 15 to 17 g of saturated fat daily. Saturated and trans fats are considered "bad" fats, and they will raise LDL cholesterol. Saturated fats are primarily found in animal products such as meats, butter, and cream. However, that does not mean you need to give up all your favorite foods. Today, there are good tasting, low-fat, low-cholesterol substitutes for most of the things you like to eat. Choose low-fat or nonfat alternatives. Choose round or loin cuts of red meat. These types of cuts are lowest in fat and cholesterol. Chicken (without the skin), fish, veal, and ground turkey breast are great choices. Eliminate fatty meats, such as hot dogs and salami. Even shellfish have little or no saturated fat. Have a 3 oz (85 g) portion when you eat lean meat, poultry, or fish. Trans fats are also called "partially hydrogenated oils." They are oils that have been scientifically manipulated so that they are solid at room temperature resulting in a longer shelf life and improved taste and texture of foods in which they are added. Trans fats are found in stick margarine, some tub margarines, cookies, crackers, and baked goods.  When baking and cooking, oils are a great substitute for butter. The monounsaturated oils are  especially beneficial since it is believed they lower LDL and raise HDL. The oils you should avoid entirely are saturated tropical oils, such as coconut and palm.  Remember to eat a lot from food groups that are naturally free of saturated and trans fat, including fish, fruit, vegetables, beans, grains (barley, rice, couscous, bulgur wheat), and pasta (without cream sauces).  IDENTIFYING FOODS THAT LOWER FAT AND CHOLESTEROL  Soluble fiber may lower your cholesterol. This type of fiber is found in fruits such as apples, vegetables such as broccoli, potatoes, and carrots, legumes such as beans, peas, and lentils, and grains such as barley. Foods fortified with plant sterols (phytosterol) may also lower cholesterol. You should eat at least 2 g per day of these foods for a cholesterol lowering effect.  Read package labels to identify low-saturated fats, trans fat free, and low-fat foods at the supermarket. Select cheeses that have only 2 to 3 g saturated fat per ounce. Use a heart-healthy tub margarine that is free of trans fats or partially hydrogenated oil. When buying baked goods (cookies, crackers), avoid partially hydrogenated oils. Breads and muffins should be made from whole grains (whole-wheat or whole oat flour, instead of "flour" or "enriched flour"). Buy non-creamy canned soups with reduced salt and no added fats.  FOOD PREPARATION TECHNIQUES  Never deep-fry. If you must fry, either stir-fry, which uses very little fat, or use non-stick cooking sprays. When possible, broil, bake, or roast meats, and steam vegetables. Instead of putting butter or margarine on vegetables, use lemon and herbs, applesauce, and cinnamon (for squash and sweet potatoes). Use nonfat   yogurt, salsa, and low-fat dressings for salads.  LOW-SATURATED FAT / LOW-FAT FOOD SUBSTITUTES Meats / Saturated Fat (g)  Avoid: Steak, marbled (3 oz/85 g) / 11 g  Choose: Steak, lean (3 oz/85 g) / 4 g  Avoid: Hamburger (3 oz/85 g) / 7  g  Choose: Hamburger, lean (3 oz/85 g) / 5 g  Avoid: Ham (3 oz/85 g) / 6 g  Choose: Ham, lean cut (3 oz/85 g) / 2.4 g  Avoid: Chicken, with skin, dark meat (3 oz/85 g) / 4 g  Choose: Chicken, skin removed, dark meat (3 oz/85 g) / 2 g  Avoid: Chicken, with skin, light meat (3 oz/85 g) / 2.5 g  Choose: Chicken, skin removed, light meat (3 oz/85 g) / 1 g Dairy / Saturated Fat (g)  Avoid: Whole milk (1 cup) / 5 g  Choose: Low-fat milk, 2% (1 cup) / 3 g  Choose: Low-fat milk, 1% (1 cup) / 1.5 g  Choose: Skim milk (1 cup) / 0.3 g  Avoid: Hard cheese (1 oz/28 g) / 6 g  Choose: Skim milk cheese (1 oz/28 g) / 2 to 3 g  Avoid: Cottage cheese, 4% fat (1 cup) / 6.5 g  Choose: Low-fat cottage cheese, 1% fat (1 cup) / 1.5 g  Avoid: Ice cream (1 cup) / 9 g  Choose: Sherbet (1 cup) / 2.5 g  Choose: Nonfat frozen yogurt (1 cup) / 0.3 g  Choose: Frozen fruit bar / trace  Avoid: Whipped cream (1 tbs) / 3.5 g  Choose: Nondairy whipped topping (1 tbs) / 1 g Condiments / Saturated Fat (g)  Avoid: Mayonnaise (1 tbs) / 2 g  Choose: Low-fat mayonnaise (1 tbs) / 1 g  Avoid: Butter (1 tbs) / 7 g  Choose: Extra light margarine (1 tbs) / 1 g  Avoid: Coconut oil (1 tbs) / 11.8 g  Choose: Olive oil (1 tbs) / 1.8 g  Choose: Corn oil (1 tbs) / 1.7 g  Choose: Safflower oil (1 tbs) / 1.2 g  Choose: Sunflower oil (1 tbs) / 1.4 g  Choose: Soybean oil (1 tbs) / 2.4 g  Choose: Canola oil (1 tbs) / 1 g Document Released: 02/17/2005 Document Revised: 06/14/2012 Document Reviewed: 05/18/2013 [employer] Patient Information 2015 [place], [place]. This information is not intended to replace advice given to you by your health care provider. Make sure you discuss any questions you have with your health care provider. Triglycerides, TG, TRIG This is a test to check your risk of developing heart disease. It is often done as part of a lipid profile during a regular medical exam or if you are being  treated for high triglycerides. This test measures the amount of triglycerides in your blood. Triglycerides are the body's storage form for fat. Most triglycerides are found in fat tissue. Some triglycerides circulate in the blood to provide fuel for muscles to work. Extra triglycerides are found in the blood after eating a meal when fat is being sent from the gut to fat tissue for storage. The test for triglycerides should be done when you are fasting and no extra triglycerides from a recent meal are present.  SAMPLE COLLECTION The test for triglycerides uses a blood sample. Most often, the blood sample is collected using a needle to collect blood from a vein. Sometimes triglycerides are measured using a drop of blood collected by puncturing the skin on a finger. Testing should be done when you are fasting. For 12 to 14 hours  before the test, only water is permitted. In addition, alcohol should not be consumed for the 24 hours just before the test. If you are diabetic and your blood sugar is out of control, triglycerides will be very high. NORMAL FINDINGS  Adult/elderly  Female: 40-160 mg/dL or 0.45-1.81 mmol/L (SI units)  Female: 35-135 mg/dL or 0.40-1.52 mmol/L (SI units)  0-53 years  Female: 30-86 mg/dL  Female: 32-99 mg/dL  6-53 years  Female: 31-108 mg/dL  Female: 35-114 mg/dL  12-53 years  Female: 36-138 mg/dL  Female: 41-138 mg/dL  16-53 years  Female: 40-163 mg/dL  Female: 40-128 mg/dL Ranges for normal findings may vary among different laboratories and hospitals. You should always check with your doctor after having lab work or other tests done to discuss the meaning of your test results and whether your values are considered within normal limits. MEANING OF TEST  Your caregiver will go over the test results with you and discuss the importance and meaning of your results, as well as treatment options and the need for additional tests if necessary. OBTAINING THE TEST RESULTS It  is your responsibility to obtain your test results. Ask the lab or department performing the test when and how you will get your results. Document Released: 03/22/2004 Document Revised: 05/12/2011 Document Reviewed: 05/30/2013 [facility] Patient Information 2015 [place], [place]. This information is not intended to replace advice given to you by your health care provider. Make sure you discuss any questions you have with your health care provider.

## 2014-02-08 NOTE — Progress Notes (Signed)
   Subjective:    Patient ID: Lisa Parrish, female    DOB: [birth year], 53 y.o.   MRN: [id number]  HPI Patient is here for follow up on her labs.  She had a lipid panel drawn for work and the results are Trigs 357 HDL - 49, LDL - 94.  She had lipid panel in 2014 and it showed slightly elevated trig level of 167. She is concerned about the trigs being elevated.  She denies having eaten heavy trig diet prior to having labs and she states she was fasting for 12 hours prior.  Review of Systems  Constitutional: Negative for fever.  HENT: Negative for ear pain.   Eyes: Negative for discharge.  Respiratory: Negative for cough.   Cardiovascular: Negative for chest pain.  Gastrointestinal: Negative for abdominal distention.  Endocrine: Negative for polyuria.  Genitourinary: Negative for difficulty urinating.  Musculoskeletal: Negative for gait problem and neck pain.  Skin: Negative for color change and rash.  Neurological: Negative for speech difficulty and headaches.  Psychiatric/Behavioral: Negative for agitation.       Objective:    BP 129/79 mmHg  Pulse 71  Temp(Src) 97.3 F (36.3 C) (Oral)  Ht 5\' 6"  (1.676 m)  Wt 174 lb (78.926 kg)  BMI 28.10 kg/m2 Physical Exam  Constitutional: She is oriented to person, place, and time. She appears well-developed and well-nourished.  HENT:  Head: Normocephalic and atraumatic.  Mouth/Throat: Oropharynx is clear and moist.  Eyes: Pupils are equal, round, and reactive to light.  Neck: Normal range of motion. Neck supple.  Cardiovascular: Normal rate and regular rhythm.   No murmur heard. Pulmonary/Chest: Effort normal and breath sounds normal.  Abdominal: Soft. Bowel sounds are normal. There is no tenderness.  Neurological: She is alert and oriented to person, place, and time.  Skin: Skin is warm and dry.  Psychiatric: She has a normal mood and affect.          Assessment & Plan:     ICD-9-CM ICD-10-CM   1. Hypertriglyceridemia 272.1  E78.1    Discussed getting follow up lipid level and no fatty foods prior for a week and then get hgbaic since labs show hx of elevated glucose.  Return if symptoms worsen or fail to improve.  Lysbeth Penner FNP

## 2014-02-10 ENCOUNTER — Encounter: Payer: Self-pay | Admitting: *Deleted

## 2014-02-16 ENCOUNTER — Telehealth: Payer: Self-pay | Admitting: Family Medicine

## 2014-02-20 ENCOUNTER — Telehealth: Payer: Self-pay | Admitting: Family Medicine

## 2014-02-20 NOTE — Telephone Encounter (Signed)
Order given

## 2014-02-20 NOTE — Telephone Encounter (Signed)
Patient aware of lab results.  Results will be scanned into epic.

## 2014-02-20 NOTE — Telephone Encounter (Signed)
No orders or results to review.

## 2014-03-21 ENCOUNTER — Encounter: Payer: 59 | Admitting: Nurse Practitioner

## 2014-03-29 ENCOUNTER — Ambulatory Visit (INDEPENDENT_AMBULATORY_CARE_PROVIDER_SITE_OTHER): Payer: 59

## 2014-03-29 ENCOUNTER — Encounter: Payer: Self-pay | Admitting: Nurse Practitioner

## 2014-03-29 ENCOUNTER — Ambulatory Visit (INDEPENDENT_AMBULATORY_CARE_PROVIDER_SITE_OTHER): Payer: 59 | Admitting: Nurse Practitioner

## 2014-03-29 VITALS — BP 126/83 | HR 74 | Temp 97.7°F | Ht 66.0 in | Wt 178.0 lb

## 2014-03-29 DIAGNOSIS — Z0000 Encounter for general adult medical examination without abnormal findings: Secondary | ICD-10-CM

## 2014-03-29 DIAGNOSIS — Z720 Tobacco use: Secondary | ICD-10-CM

## 2014-03-29 DIAGNOSIS — Z87891 Personal history of nicotine dependence: Secondary | ICD-10-CM

## 2014-03-29 NOTE — Progress Notes (Signed)
   Subjective:    Patient ID: Lisa Parrish, female    DOB: [birth year], 54 y.o.   MRN: [id number]  HPI Patient in today for annual physical exam - SHe is doing well today without complaints other than her right ear feels stopped up. SHe was just seen in the office for follow up of chronic problems in December 2015.  Patient Active Problem List   Diagnosis Date Noted  . Facial cellulitis 06/12/2013  . Acute sinusitis 06/12/2013  . Chronic pain 06/12/2013  . HTN (hypertension) 08/24/2012  . Other and unspecified hyperlipidemia 08/24/2012  . Migraines 08/24/2012  . Anxiety state, unspecified 08/24/2012   Outpatient Encounter Prescriptions as of 03/29/2014  Medication Sig  . azelastine (OPTIVAR) 0.05 % ophthalmic solution One drop in each eye daily  . buPROPion (WELLBUTRIN XL) 300 MG 24 hr tablet TAKE ONE (1) TABLET EACH DAY  . cyclobenzaprine (FLEXERIL) 10 MG tablet Take 10 mg by mouth 3 (three) times daily as needed.  Marland Kitchen HYDROcodone-acetaminophen (NORCO/VICODIN) 5-325 MG per tablet Take 1 tablet by mouth every 4 (four) hours as needed for moderate pain (back pain).   . metoprolol succinate (TOPROL-XL) 50 MG 24 hr tablet Take 1 tablet (50 mg total) by mouth daily. Take with or immediately following a meal.  . pravastatin (PRAVACHOL) 10 MG tablet Take 1 tablet (10 mg total) by mouth at bedtime.        Review of Systems  Constitutional: Negative.   HENT: Negative.   Respiratory: Negative.   Cardiovascular: Negative.   Gastrointestinal: Negative.   Genitourinary: Negative.   Neurological: Negative.   Psychiatric/Behavioral: Negative.   All other systems reviewed and are negative.      Objective:   Physical Exam  Constitutional: She is oriented to person, place, and time. She appears well-developed and well-nourished.  HENT:  Nose: Nose normal.  Mouth/Throat: Oropharynx is clear and moist.  Eyes: EOM are normal.  Neck: Trachea normal, normal range of motion and full passive  range of motion without pain. Neck supple. No JVD present. Carotid bruit is not present. No thyromegaly present.  Cardiovascular: Normal rate, regular rhythm, normal heart sounds and intact distal pulses.  Exam reveals no gallop and no friction rub.   No murmur heard. Pulmonary/Chest: Effort normal and breath sounds normal.  Abdominal: Soft. Bowel sounds are normal. She exhibits no distension and no mass. There is no tenderness.  Musculoskeletal: Normal range of motion.  Lymphadenopathy:    She has no cervical adenopathy.  Neurological: She is alert and oriented to person, place, and time. She has normal reflexes.  Skin: Skin is warm and dry.  Psychiatric: She has a normal mood and affect. Her behavior is normal. Judgment and thought content normal.    BP 126/83 mmHg  Pulse 74  Temp(Src) 97.7 F (36.5 C) (Oral)  Ht 5\' 6"  (1.676 m)  Wt 178 lb (80.74 kg)  BMI 28.74 kg/m2  Chest x ray- normal-Preliminary reading by Ronnald Collum, FNP  [facility]   EKG- Kerry Hough, FNP      Assessment & Plan:  1. Annual physical exam  - EKG 12-Lead - POCT CBC - Thyroid Panel With TSH - Vit D  25 hydroxy (rtn osteoporosis monitoring)  2. Smoking history - DG Chest 2 View; Future   hemoccult cards given to patient with directions Labs pending Health maintenance reviewed Diet and exercise encouraged Continue all meds Follow up  In 6 months   [place], FNP

## 2014-03-29 NOTE — Addendum Note (Signed)
Addended by: Pollyann Kennedy F on: 03/29/2014 04:14 PM   Modules accepted: Orders

## 2014-03-29 NOTE — Addendum Note (Signed)
Addended by: Selmer Dominion on: 03/29/2014 04:04 PM   Modules accepted: Orders

## 2014-03-30 LAB — CBC WITH DIFFERENTIAL/PLATELET
Basophils Absolute: 0 10*3/uL (ref 0.0–0.2)
Basos: 1 %
Eos: 4 %
Eosinophils Absolute: 0.3 10*3/uL (ref 0.0–0.4)
HCT: 38.3 % (ref 34.0–46.6)
Hemoglobin: 12.8 g/dL (ref 11.1–15.9)
Immature Grans (Abs): 0 10*3/uL (ref 0.0–0.1)
Immature Granulocytes: 0 %
Lymphocytes Absolute: 2.7 10*3/uL (ref 0.7–3.1)
Lymphs: 40 %
MCH: 28.1 pg (ref 26.6–33.0)
MCHC: 33.4 g/dL (ref 31.5–35.7)
MCV: 84 fL (ref 79–97)
Monocytes Absolute: 0.5 10*3/uL (ref 0.1–0.9)
Monocytes: 7 %
Neutrophils Absolute: 3.3 10*3/uL (ref 1.4–7.0)
Neutrophils Relative %: 48 %
Platelets: 277 10*3/uL (ref 150–379)
RBC: 4.55 x10E6/uL (ref 3.77–5.28)
RDW: 14.1 % (ref 12.3–15.4)
WBC: 6.9 10*3/uL (ref 3.4–10.8)

## 2014-03-30 LAB — VITAMIN D 25 HYDROXY (VIT D DEFICIENCY, FRACTURES): Vit D, 25-Hydroxy: 52.2 ng/mL (ref 30.0–100.0)

## 2014-03-30 LAB — THYROID PANEL WITH TSH
Free Thyroxine Index: 2.3 (ref 1.2–4.9)
T3 Uptake Ratio: 29 % (ref 24–39)
T4, Total: 7.9 ug/dL (ref 4.5–12.0)
TSH: 1.08 u[IU]/mL (ref 0.450–4.500)

## 2014-07-13 ENCOUNTER — Other Ambulatory Visit (INDEPENDENT_AMBULATORY_CARE_PROVIDER_SITE_OTHER): Payer: BLUE CROSS/BLUE SHIELD

## 2014-07-13 DIAGNOSIS — B351 Tinea unguium: Secondary | ICD-10-CM

## 2014-07-13 NOTE — Progress Notes (Signed)
Lab work for Dr Steffanie Rainwater Hepatic function panel B35.1

## 2014-07-14 LAB — HEPATIC FUNCTION PANEL
ALT: 10 IU/L (ref 0–32)
AST: 10 IU/L (ref 0–40)
Albumin: 4.5 g/dL (ref 3.5–5.5)
Alkaline Phosphatase: 80 IU/L (ref 39–117)
Bilirubin Total: 0.2 mg/dL (ref 0.0–1.2)
Bilirubin, Direct: 0.09 mg/dL (ref 0.00–0.40)
Total Protein: 6.4 g/dL (ref 6.0–8.5)

## 2014-10-20 ENCOUNTER — Other Ambulatory Visit: Payer: Self-pay | Admitting: *Deleted

## 2014-10-20 DIAGNOSIS — F32A Depression, unspecified: Secondary | ICD-10-CM

## 2014-10-20 DIAGNOSIS — E785 Hyperlipidemia, unspecified: Secondary | ICD-10-CM

## 2014-10-20 DIAGNOSIS — F329 Major depressive disorder, single episode, unspecified: Secondary | ICD-10-CM

## 2014-10-20 DIAGNOSIS — I10 Essential (primary) hypertension: Secondary | ICD-10-CM

## 2014-10-20 MED ORDER — BUPROPION HCL ER (XL) 300 MG PO TB24: ORAL_TABLET | ORAL | Status: DC

## 2014-10-20 MED ORDER — BUPROPION HCL ER (XL) 300 MG PO TB24
ORAL_TABLET | ORAL | Status: DC
Start: 2014-10-20 — End: 2014-10-20

## 2014-10-20 MED ORDER — METOPROLOL SUCCINATE ER 50 MG PO TB24
50.0000 mg | ORAL_TABLET | Freq: Every day | ORAL | Status: DC
Start: 2014-10-20 — End: 2015-01-17

## 2014-10-20 MED ORDER — METOPROLOL SUCCINATE ER 50 MG PO TB24: 50.0000 mg | ORAL_TABLET | Freq: Every day | ORAL | Status: DC

## 2014-11-30 ENCOUNTER — Telehealth: Payer: Self-pay | Admitting: Nurse Practitioner

## 2014-12-19 ENCOUNTER — Encounter (HOSPITAL_COMMUNITY): Payer: Self-pay | Admitting: Emergency Medicine

## 2014-12-19 ENCOUNTER — Observation Stay (HOSPITAL_COMMUNITY)
Admission: EM | Admit: 2014-12-19 | Discharge: 2014-12-21 | Disposition: A | Discharge status: Home / Self Care | Payer: BLUE CROSS/BLUE SHIELD | Attending: Internal Medicine | Admitting: Internal Medicine

## 2014-12-19 ENCOUNTER — Emergency Department (HOSPITAL_COMMUNITY): Payer: BLUE CROSS/BLUE SHIELD

## 2014-12-19 DIAGNOSIS — Z79899 Other long term (current) drug therapy: Secondary | ICD-10-CM | POA: Insufficient documentation

## 2014-12-19 DIAGNOSIS — Z8249 Family history of ischemic heart disease and other diseases of the circulatory system: Secondary | ICD-10-CM | POA: Diagnosis not present

## 2014-12-19 DIAGNOSIS — Z87891 Personal history of nicotine dependence: Secondary | ICD-10-CM | POA: Diagnosis not present

## 2014-12-19 DIAGNOSIS — E785 Hyperlipidemia, unspecified: Secondary | ICD-10-CM | POA: Diagnosis not present

## 2014-12-19 DIAGNOSIS — I10 Essential (primary) hypertension: Secondary | ICD-10-CM | POA: Diagnosis not present

## 2014-12-19 DIAGNOSIS — M1990 Unspecified osteoarthritis, unspecified site: Secondary | ICD-10-CM | POA: Diagnosis not present

## 2014-12-19 DIAGNOSIS — I272 Other secondary pulmonary hypertension: Secondary | ICD-10-CM | POA: Insufficient documentation

## 2014-12-19 DIAGNOSIS — Z9889 Other specified postprocedural states: Secondary | ICD-10-CM

## 2014-12-19 DIAGNOSIS — F32A Depression, unspecified: Secondary | ICD-10-CM

## 2014-12-19 DIAGNOSIS — F329 Major depressive disorder, single episode, unspecified: Secondary | ICD-10-CM | POA: Insufficient documentation

## 2014-12-19 DIAGNOSIS — R079 Chest pain, unspecified: Principal | ICD-10-CM | POA: Insufficient documentation

## 2014-12-19 DIAGNOSIS — F419 Anxiety disorder, unspecified: Secondary | ICD-10-CM | POA: Insufficient documentation

## 2014-12-19 DIAGNOSIS — I200 Unstable angina: Secondary | ICD-10-CM | POA: Insufficient documentation

## 2014-12-19 DIAGNOSIS — Z79891 Long term (current) use of opiate analgesic: Secondary | ICD-10-CM | POA: Diagnosis not present

## 2014-12-19 DIAGNOSIS — G4730 Sleep apnea, unspecified: Secondary | ICD-10-CM | POA: Diagnosis present

## 2014-12-19 HISTORY — DX: Pulmonary hypertension, unspecified: I27.20

## 2014-12-19 HISTORY — DX: Other specified postprocedural states: Z98.890

## 2014-12-19 LAB — COMPREHENSIVE METABOLIC PANEL
ALT: 16 U/L (ref 14–54)
AST: 19 U/L (ref 15–41)
Albumin: 4.2 g/dL (ref 3.5–5.0)
Alkaline Phosphatase: 72 U/L (ref 38–126)
Anion gap: 7 (ref 5–15)
BUN: 20 mg/dL (ref 6–20)
CO2: 27 mmol/L (ref 22–32)
Calcium: 9.2 mg/dL (ref 8.9–10.3)
Chloride: 106 mmol/L (ref 101–111)
Creatinine, Ser: 0.73 mg/dL (ref 0.44–1.00)
GFR calc Af Amer: >60 mL/min (ref >60)
GFR calc non Af Amer: >60 mL/min (ref >60)
Glucose, Bld: 94 mg/dL (ref 65–99)
Potassium: 3.5 mmol/L (ref 3.5–5.1)
Sodium: 140 mmol/L (ref 135–145)
Total Bilirubin: 0.5 mg/dL (ref 0.3–1.2)
Total Protein: 7.1 g/dL (ref 6.5–8.1)

## 2014-12-19 LAB — CBC
HCT: 35.1 % — ABNORMAL LOW (ref 36.0–46.0)
Hemoglobin: 11.7 g/dL — ABNORMAL LOW (ref 12.0–15.0)
MCH: 29.4 pg (ref 26.0–34.0)
MCHC: 33.3 g/dL (ref 30.0–36.0)
MCV: 88.2 fL (ref 78.0–100.0)
Platelets: 201 10*3/uL (ref 150–400)
RBC: 3.98 MIL/uL (ref 3.87–5.11)
RDW: 13.4 % (ref 11.5–15.5)
WBC: 5.7 10*3/uL (ref 4.0–10.5)

## 2014-12-19 LAB — PROTIME-INR
INR: 1.03 (ref 0.00–1.49)
Prothrombin Time: 13.7 seconds (ref 11.6–15.2)

## 2014-12-19 LAB — I-STAT TROPONIN, ED: Troponin i, poc: 0 ng/mL (ref 0.00–0.08)

## 2014-12-19 LAB — APTT: aPTT: 26 seconds (ref 24–37)

## 2014-12-19 MED ORDER — ASPIRIN 81 MG PO CHEW
324.0000 mg | CHEWABLE_TABLET | Freq: Once | ORAL | Status: AC
  Administered 2014-12-19: 324 mg via ORAL
  Filled 2014-12-19: qty 4

## 2014-12-19 MED ORDER — ONDANSETRON HCL 4 MG/2ML IJ SOLN
4.0000 mg | Freq: Four times a day (QID) | INTRAMUSCULAR | Status: DC | PRN
  Administered 2014-12-20: 4 mg via INTRAVENOUS
  Filled 2014-12-19: qty 2

## 2014-12-19 MED ORDER — DEXTROSE-NACL 5-0.9 % IV SOLN
INTRAVENOUS | Status: DC
Start: 2014-12-19 — End: 2014-12-20
  Administered 2014-12-20 (×2): via INTRAVENOUS

## 2014-12-19 MED ORDER — ONDANSETRON HCL 4 MG PO TABS: 4.0000 mg | ORAL_TABLET | Freq: Four times a day (QID) | ORAL | Status: DC | PRN

## 2014-12-19 MED ORDER — ASPIRIN EC 325 MG PO TBEC
325.0000 mg | DELAYED_RELEASE_TABLET | Freq: Every day | ORAL | Status: DC
  Administered 2014-12-20: 325 mg via ORAL
  Filled 2014-12-19: qty 1

## 2014-12-19 MED ORDER — ENOXAPARIN SODIUM 80 MG/0.8ML ~~LOC~~ SOLN
1.0000 mg/kg | Freq: Two times a day (BID) | SUBCUTANEOUS | Status: DC
  Administered 2014-12-20 (×2): 75 mg via SUBCUTANEOUS
  Filled 2014-12-19 (×2): qty 0.8

## 2014-12-19 MED ORDER — MORPHINE SULFATE (PF) 2 MG/ML IV SOLN
2.0000 mg | INTRAVENOUS | Status: DC | PRN
  Administered 2014-12-20 (×5): 2 mg via INTRAVENOUS
  Filled 2014-12-19 (×5): qty 1

## 2014-12-19 MED ORDER — ATORVASTATIN CALCIUM 40 MG PO TABS
40.0000 mg | ORAL_TABLET | Freq: Every day | ORAL | Status: DC
  Administered 2014-12-20: 40 mg via ORAL
  Filled 2014-12-19: qty 1

## 2014-12-19 MED ORDER — NITROGLYCERIN 0.4 MG SL SUBL: 0.4000 mg | SUBLINGUAL_TABLET | SUBLINGUAL | Status: DC | PRN

## 2014-12-19 MED ORDER — SODIUM CHLORIDE 0.9 % IJ SOLN
3.0000 mL | Freq: Two times a day (BID) | INTRAMUSCULAR | Status: DC
  Administered 2014-12-19 – 2014-12-21 (×4): 3 mL via INTRAVENOUS

## 2014-12-19 MED ORDER — SODIUM CHLORIDE 0.9 % IV SOLN
1000.0000 mL | INTRAVENOUS | Status: DC
  Administered 2014-12-19: 1000 mL via INTRAVENOUS

## 2014-12-19 MED ORDER — METOPROLOL SUCCINATE ER 50 MG PO TB24
50.0000 mg | ORAL_TABLET | Freq: Every day | ORAL | Status: DC
  Administered 2014-12-20 – 2014-12-21 (×2): 50 mg via ORAL
  Filled 2014-12-19 (×3): qty 1

## 2014-12-19 MED ORDER — NITROGLYCERIN 2 % TD OINT
1.0000 [in_us] | TOPICAL_OINTMENT | Freq: Once | TRANSDERMAL | Status: AC
  Administered 2014-12-19: 1 [in_us] via TOPICAL
  Filled 2014-12-19: qty 1

## 2014-12-19 MED ORDER — DOCUSATE SODIUM 100 MG PO CAPS
100.0000 mg | ORAL_CAPSULE | Freq: Two times a day (BID) | ORAL | Status: DC
  Administered 2014-12-20 – 2014-12-21 (×4): 100 mg via ORAL
  Filled 2014-12-19 (×4): qty 1

## 2014-12-19 MED ORDER — BUPROPION HCL ER (XL) 150 MG PO TB24
300.0000 mg | ORAL_TABLET | Freq: Every day | ORAL | Status: DC
  Administered 2014-12-20 – 2014-12-21 (×2): 300 mg via ORAL
  Filled 2014-12-19 (×3): qty 1
  Filled 2014-12-19: qty 2

## 2014-12-19 NOTE — ED Notes (Signed)
Pt c/o chest pain that in central chest pain radiates down the rt arm.

## 2014-12-19 NOTE — ED Notes (Signed)
Report given to floor. Pt ready for transport. 

## 2014-12-19 NOTE — ED Provider Notes (Signed)
CSN: [id number]     Arrival date & time 12/19/14  1909 History   First MD Initiated Contact with Patient 12/19/14 1929     Chief Complaint  Patient presents with  . Chest Pain    HPI Patient presents to the emergency room for evaluation of chest pain. Patient developed acute onset of a pressure-type squeezing pain in the center of her chest rating down her right arm. Symptoms started a few hours ago. Patient was not doing anything strenuous. Associated with the symptoms, she experienced nausea and some shortness of breath. She denies any trouble with coughing or fever. No leg swelling. Patient has history of hypertension and hyperlipidemia. She does not smoke. Her father had a myocardial infarction in his 2s. She has no history of similar symptoms in the past.  Past Medical History  Diagnosis Date  . Osteoarthritis     back  . Hyperlipidemia   . Hypertension   . Headache, migraine   . Anxiety   . Seasonal allergies   . Depression   . Kidney stones    Past Surgical History  Procedure Laterality Date  . Hemorrhoid surgery  1989  . Tubal ligation  1990  . Lumbar disckectomy  2006   Family History  Problem Relation Age of Onset  . Colon cancer Paternal Aunt 61    father's half sister  . Colon cancer Paternal Uncle 46    father's half brother  . Cancer Mother   . Heart disease Father    Social History  Substance Use Topics  . Smoking status: Former Smoker -- 1.00 packs/day for 10 years    Types: Cigarettes    Quit date: 09/18/1991  . Smokeless tobacco: Never Used  . Alcohol Use: No   OB History    No data available     Review of Systems  All other systems reviewed and are negative.     Allergies  Morphine and related  Home Medications   Prior to Admission medications   Medication Sig Start Date End Date Taking? Authorizing Provider  azelastine (OPTIVAR) 0.05 % ophthalmic solution One drop in each eye daily 02/08/14   Lysbeth Penner, FNP  buPROPion  (WELLBUTRIN XL) 300 MG 24 hr tablet TAKE ONE (1) TABLET EACH DAY 10/20/14   Mary-Margaret Hassell Done, FNP  cyclobenzaprine (FLEXERIL) 10 MG tablet Take 10 mg by mouth 3 (three) times daily as needed.    Historical Provider, MD  HYDROcodone-acetaminophen (NORCO/VICODIN) 5-325 MG per tablet Take 1 tablet by mouth every 4 (four) hours as needed for moderate pain (back pain).     Historical Provider, MD  metoprolol succinate (TOPROL-XL) 50 MG 24 hr tablet Take 1 tablet (50 mg total) by mouth daily. Take with or immediately following a meal. 10/20/14   Mary-Margaret Hassell Done, FNP  pravastatin (PRAVACHOL) 10 MG tablet Take 1 tablet (10 mg total) by mouth at bedtime. 08/29/13   Lysbeth Penner, FNP   BP 138/84 mmHg  Pulse 70  Temp(Src) 97.7 F (36.5 C)  Resp 18  Ht 5\' 5"  (1.651 m)  Wt 167 lb (75.751 kg)  BMI 27.79 kg/m2  SpO2 99% Physical Exam  Constitutional: She appears well-developed and well-nourished. No distress.  HENT:  Head: Normocephalic and atraumatic.  Right Ear: External ear normal.  Left Ear: External ear normal.  Eyes: Conjunctivae are normal. Right eye exhibits no discharge. Left eye exhibits no discharge. No scleral icterus.  Neck: Neck supple. No tracheal deviation present.  Cardiovascular: Normal rate, regular  rhythm and intact distal pulses.   Pulmonary/Chest: Effort normal and breath sounds normal. No stridor. No respiratory distress. She has no wheezes. She has no rales.  Abdominal: Soft. Bowel sounds are normal. She exhibits no distension. There is no tenderness. There is no rebound and no guarding.  Musculoskeletal: She exhibits no edema or tenderness.  Normal radial pulses bilaterally  Neurological: She is alert. She has normal strength. No cranial nerve deficit (no facial droop, extraocular movements intact, no slurred speech) or sensory deficit. She exhibits normal muscle tone. She displays no seizure activity. Coordination normal.  Skin: Skin is warm and dry. No rash noted.   Psychiatric: She has a normal mood and affect.  Nursing note and vitals reviewed.   ED Course  Procedures (including critical care time) Labs Review Labs Reviewed  APTT  CBC  COMPREHENSIVE METABOLIC PANEL  [place], ED    Imaging Review No results found. I have personally reviewed and evaluated these images and lab results as part of my medical decision-making.   EKG Interpretation   Date/Time:  Tuesday December 19 2014 19:29:08 EDT Ventricular Rate:  67 PR Interval:  154 QRS Duration: 86 QT Interval:  384 QTC Calculation: 405 R Axis:   36 Text Interpretation:  Sinus rhythm Borderline T abnormalities, anterior  leads Since last tracing rate slower Confirmed by Salvator Seppala  MD-J, Lejla Moeser ([id number])  on 12/19/2014 7:32:51 PM     Medications  0.9 %  sodium chloride infusion (1,000 mLs Intravenous New Bag/Given 12/19/14 1955)  nitroGLYCERIN (NITROSTAT) SL tablet 0.4 mg (not administered)  aspirin chewable tablet 324 mg (324 mg Oral Given 12/19/14 1952)  nitroGLYCERIN (NITROGLYN) 2 % ointment 1 inch (1 inch Topical Given 12/19/14 1952)   2126  Sx have resolved with treatment. MDM   Final diagnoses:  None    Patient's symptoms are concerning for acute coronary syndrome. Her heart score is 5 suggesting Moderate risk.   Will consult with medical service for admission, serial cardiac enzymes and further evaluation.   Dorie Rank, MD 12/19/14 2126

## 2014-12-19 NOTE — H&P (Signed)
[facility] History and Physical  Lisa Parrish NKN:[id number] DOB: [birth year]    PCP:   Chevis Pretty, FNP   Chief Complaint: Retrosternal chest pain with right arm radiation and nausea.  HPI: Lisa Parrish is an 54 y.o. female with hx of hyperlipidemia, HTN, and no hx of exertional chest pain, presented to the ER with retrostenal chest pain started about 5 hous PTA, accompanied by nausea, no vomiting, and mild SOB with no diaphoresis.  When she is in the ER, NTG was given with resolution of her discomfort.  Work up in the ER included a CXR which showed no acute process, negative initial troponin, and unremarkable serology.  Her father had CAD, but after 40 yo.  She is usually active and has no exertional chest pain.  She has no hx of bleeding ulcer, or PUD.  Hospitalist was asked to admit her for chest pain concerning for angina.   Rewiew of Systems:  Constitutional: Negative for malaise, fever and chills. No significant weight loss or weight gain Eyes: Negative for eye pain, redness and discharge, diplopia, visual changes, or flashes of light. ENMT: Negative for ear pain, hoarseness, nasal congestion, sinus pressure and sore throat. No headaches; tinnitus, drooling, or problem swallowing. Cardiovascular: Negative for palpitations, diaphoresis, dyspnea and peripheral edema. ; No orthopnea, PND Respiratory: Negative for cough, hemoptysis, wheezing and stridor. No pleuritic chestpain. Gastrointestinal: Negative for nausea, vomiting, diarrhea, constipation, abdominal pain, melena, blood in stool, hematemesis, jaundice and rectal bleeding.    Genitourinary: Negative for frequency, dysuria, incontinence,flank pain and hematuria; Musculoskeletal: Negative for back pain and neck pain. Negative for swelling and trauma.;  Skin: . Negative for pruritus, rash, abrasions, bruising and skin lesion.; ulcerations Neuro: Negative for headache, lightheadedness and neck stiffness. Negative for  weakness, altered level of consciousness , altered mental status, extremity weakness, burning feet, involuntary movement, seizure and syncope.  Psych: negative for anxiety, depression, insomnia, tearfulness, panic attacks, hallucinations, paranoia, suicidal or homicidal ideation   Past Medical History  Diagnosis Date  . Osteoarthritis     back  . Hyperlipidemia   . Hypertension   . Headache, migraine   . Anxiety   . Seasonal allergies   . Depression   . Kidney stones     Past Surgical History  Procedure Laterality Date  . Hemorrhoid surgery  1989  . Tubal ligation  1990  . Lumbar disckectomy  2006    Medications:  HOME MEDS: Prior to Admission medications   Medication Sig Start Date End Date Taking? Authorizing Provider  azelastine (OPTIVAR) 0.05 % ophthalmic solution One drop in each eye daily Patient taking differently: Place 1 drop into both eyes daily as needed (for allergy eye).  02/08/14  Yes Lysbeth Penner, FNP  buPROPion (WELLBUTRIN XL) 300 MG 24 hr tablet TAKE ONE (1) TABLET EACH DAY Patient taking differently: Take 300 mg by mouth daily.  10/20/14  Yes Mary-Margaret Hassell Done, FNP  metoprolol succinate (TOPROL-XL) 50 MG 24 hr tablet Take 1 tablet (50 mg total) by mouth daily. Take with or immediately following a meal. 10/20/14  Yes Mary-Margaret Hassell Done, FNP  Multiple Vitamins-Minerals (HAIR/SKIN/NAILS PO) Take 1 tablet by mouth daily.   Yes Historical Provider, MD  pravastatin (PRAVACHOL) 10 MG tablet Take 1 tablet (10 mg total) by mouth at bedtime. 08/29/13  Yes Lysbeth Penner, FNP  Pseudoephedrine-Ibuprofen (ADVIL COLD/SINUS) 30-200 MG TABS Take 1 tablet by mouth daily as needed (for sinus/allergy).   Yes Historical Provider, MD     Allergies:  Allergies  Allergen Reactions  . Morphine And Related Nausea And Vomiting    Social History:   reports that she quit smoking about 23 years ago. Her smoking use included Cigarettes. She has a 10 pack-year smoking  history. She has never used smokeless tobacco. She reports that she does not drink alcohol or use illicit drugs.  Family History: Family History  Problem Relation Age of Onset  . Colon cancer Paternal Aunt 45    father's half sister  . Colon cancer Paternal Uncle 41    father's half brother  . Cancer Mother   . Heart disease Father      Physical Exam: Filed Vitals:   12/19/14 2130 12/19/14 2145 12/19/14 2200 12/19/14 2215  BP: 113/70 97/72 118/70 134/87  Pulse: 67 68 64 65  Temp:      Resp: 16 17 14 17   Height:      Weight:      SpO2: 96% 96% 96% 96%   Blood pressure 134/87, pulse 65, temperature 97.7 F (36.5 C), resp. rate 17, height 5\' 5"  (1.651 m), weight 75.751 kg (167 lb), SpO2 96 %.  GEN:  Pleasant patient lying in the stretcher in no acute distress; cooperative with exam. PSYCH:  alert and oriented x4; does not appear anxious or depressed; affect is appropriate. HEENT: Mucous membranes pink and anicteric; PERRLA; EOM intact; no cervical lymphadenopathy nor thyromegaly or carotid bruit; no JVD; There were no stridor. Neck is very supple. Breasts:: Not examined CHEST WALL: No tenderness CHEST: Normal respiration, clear to auscultation bilaterally.  HEART: Regular rate and rhythm.  There are no murmur, rub, or gallops.   BACK: No kyphosis or scoliosis; no CVA tenderness ABDOMEN: soft and non-tender; no masses, no organomegaly, normal abdominal bowel sounds; no pannus; no intertriginous candida. There is no rebound and no distention. Rectal Exam: Not done EXTREMITIES: No bone or joint deformity; age-appropriate arthropathy of the hands and knees; no edema; no ulcerations.  There is no calf tenderness. Genitalia: not examined PULSES: 2+ and symmetric SKIN: Normal hydration no rash or ulceration CNS: Cranial nerves 2-12 grossly intact no focal lateralizing neurologic deficit.  Speech is fluent; uvula elevated with phonation, facial symmetry and tongue midline. DTR are  normal bilaterally, cerebella exam is intact, barbinski is negative and strengths are equaled bilaterally.  No sensory loss.   Labs on Admission:  Basic Metabolic Panel:  Recent Labs Lab 12/19/14 1959  NA 140  K 3.5  CL 106  CO2 27  GLUCOSE 94  BUN 20  CREATININE 0.73  CALCIUM 9.2   Liver Function Tests:  Recent Labs Lab 12/19/14 1959  AST 19  ALT 16  ALKPHOS 72  BILITOT 0.5  PROT 7.1  ALBUMIN 4.2   CBC:  Recent Labs Lab 12/19/14 1959  WBC 5.7  HGB 11.7*  HCT 35.1*  MCV 88.2  PLT 201    Radiological Exams on Admission: Dg Chest Portable 1 View  12/19/2014  CLINICAL DATA:  Central chest pain for 2 hours. EXAM: PORTABLE CHEST 1 VIEW COMPARISON:  02/24/2013 FINDINGS: Lung volumes are low. Cardiomediastinal contours are unchanged allowing for differences in technique. No pulmonary edema, confluent airspace disease, pleural effusion or pneumothorax. No acute osseous abnormalities are seen. IMPRESSION: Hypoventilatory chest without acute process. Electronically Signed   By: Jeb Levering M.D.   On: 12/19/2014 20:04    EKG: Independently reviewed. NSR with non specific ST T changes.    Assessment/Plan Present on Admission:  . Unstable angina pectoris (  [place]) . HTN (hypertension) . Hyperlipidemia . Unstable angina (H[facility])  PLAN:  I am concerned about the possibility of unstable angina in this patient with several cardiac risk factors.  Will start full dose Lovenox at 1mg  per kg Q12 pending r/out.   Obtain ECHO, and cardiology consultation.  Will give her Lipitor, ASA, along with NTP and obtain lipid profile.  Will continue with her PPI.  She is stable, full code, and will be admitted to [facility] service.  Thank you and Good Day.   Other plans as per orders.  Code Status: FULL CODE>    Orvan Falconer, MD. [facility] Pager [phone] 7pm to 7am.  12/19/2014, 10:35 PM

## 2014-12-20 ENCOUNTER — Encounter (HOSPITAL_COMMUNITY)
Admission: EM | Disposition: A | Discharge status: Home / Self Care | Payer: Self-pay | Source: Home / Self Care | Attending: Internal Medicine

## 2014-12-20 ENCOUNTER — Observation Stay (HOSPITAL_BASED_OUTPATIENT_CLINIC_OR_DEPARTMENT_OTHER): Payer: BLUE CROSS/BLUE SHIELD

## 2014-12-20 DIAGNOSIS — E785 Hyperlipidemia, unspecified: Secondary | ICD-10-CM | POA: Diagnosis not present

## 2014-12-20 DIAGNOSIS — R079 Chest pain, unspecified: Secondary | ICD-10-CM | POA: Diagnosis present

## 2014-12-20 DIAGNOSIS — I200 Unstable angina: Secondary | ICD-10-CM

## 2014-12-20 DIAGNOSIS — R5383 Other fatigue: Secondary | ICD-10-CM | POA: Diagnosis not present

## 2014-12-20 DIAGNOSIS — R9431 Abnormal electrocardiogram [ECG] [EKG]: Secondary | ICD-10-CM

## 2014-12-20 DIAGNOSIS — I10 Essential (primary) hypertension: Secondary | ICD-10-CM

## 2014-12-20 HISTORY — PX: CARDIAC CATHETERIZATION: SHX172

## 2014-12-20 LAB — TROPONIN I
Troponin I: <0.03 ng/mL (ref <0.031)
Troponin I: <0.03 ng/mL (ref <0.031)
Troponin I: <0.03 ng/mL (ref <0.031)

## 2014-12-20 LAB — LIPID PANEL
Cholesterol: 181 mg/dL (ref 0–200)
HDL: 46 mg/dL (ref >40)
LDL Cholesterol: 120 mg/dL — ABNORMAL HIGH (ref 0–99)
Total CHOL/HDL Ratio: 3.9 RATIO
Triglycerides: 76 mg/dL (ref <150)
VLDL: 15 mg/dL (ref 0–40)

## 2014-12-20 LAB — TSH: TSH: 1.398 u[IU]/mL (ref 0.350–4.500)

## 2014-12-20 LAB — GLUCOSE, CAPILLARY: Glucose-Capillary: 101 mg/dL — ABNORMAL HIGH (ref 65–99)

## 2014-12-20 SURGERY — LEFT HEART CATH AND CORONARY ANGIOGRAPHY

## 2014-12-20 MED ORDER — HEPARIN SODIUM (PORCINE) 1000 UNIT/ML IJ SOLN
INTRAMUSCULAR | Status: DC | PRN
  Administered 2014-12-20: 4000 [IU] via INTRAVENOUS

## 2014-12-20 MED ORDER — PANTOPRAZOLE SODIUM 40 MG PO TBEC
40.0000 mg | DELAYED_RELEASE_TABLET | Freq: Two times a day (BID) | ORAL | Status: DC
  Administered 2014-12-20 – 2014-12-21 (×3): 40 mg via ORAL
  Filled 2014-12-20 (×3): qty 1

## 2014-12-20 MED ORDER — ASPIRIN 81 MG PO CHEW: 81.0000 mg | CHEWABLE_TABLET | ORAL | Status: DC

## 2014-12-20 MED ORDER — IOHEXOL 350 MG/ML SOLN
INTRAVENOUS | Status: DC | PRN
  Administered 2014-12-20: 55 mL via INTRAVENOUS

## 2014-12-20 MED ORDER — SODIUM CHLORIDE 0.9 % IV SOLN: 250.0000 mL | INTRAVENOUS | Status: DC | PRN

## 2014-12-20 MED ORDER — ASPIRIN 81 MG PO CHEW
CHEWABLE_TABLET | ORAL | Status: DC | PRN
  Administered 2014-12-20: 324 mg via ORAL

## 2014-12-20 MED ORDER — SODIUM CHLORIDE 0.9 % IJ SOLN: 3.0000 mL | INTRAMUSCULAR | Status: DC | PRN

## 2014-12-20 MED ORDER — LIDOCAINE HCL (PF) 1 % IJ SOLN
INTRAMUSCULAR | Status: DC | PRN
  Administered 2014-12-20: 18:00:00

## 2014-12-20 MED ORDER — SODIUM CHLORIDE 0.9 % IJ SOLN: 3.0000 mL | Freq: Two times a day (BID) | INTRAMUSCULAR | Status: DC

## 2014-12-20 MED ORDER — FENTANYL CITRATE (PF) 100 MCG/2ML IJ SOLN
INTRAMUSCULAR | Status: DC | PRN
  Administered 2014-12-20 (×2): 25 ug via INTRAVENOUS

## 2014-12-20 MED ORDER — ACETAMINOPHEN 325 MG PO TABS: 650.0000 mg | ORAL_TABLET | ORAL | Status: DC | PRN

## 2014-12-20 MED ORDER — SODIUM CHLORIDE 0.9 % IV SOLN: INTRAVENOUS | Status: AC

## 2014-12-20 MED ORDER — SODIUM CHLORIDE 0.9 % IV SOLN
INTRAVENOUS | Status: DC
Start: 2014-12-20 — End: 2014-12-20

## 2014-12-20 MED ORDER — VERAPAMIL HCL 2.5 MG/ML IV SOLN
INTRAVENOUS | Status: DC | PRN
  Administered 2014-12-20: 18:00:00 via INTRA_ARTERIAL

## 2014-12-20 MED ORDER — MIDAZOLAM HCL 2 MG/2ML IJ SOLN
INTRAMUSCULAR | Status: DC | PRN
  Administered 2014-12-20: 2 mg via INTRAVENOUS
  Administered 2014-12-20: 1 mg via INTRAVENOUS

## 2014-12-20 MED ORDER — SODIUM CHLORIDE 0.9 % IJ SOLN
3.0000 mL | Freq: Two times a day (BID) | INTRAMUSCULAR | Status: DC
  Administered 2014-12-20: 3 mL via INTRAVENOUS

## 2014-12-20 MED ORDER — FENTANYL CITRATE (PF) 100 MCG/2ML IJ SOLN
INTRAMUSCULAR | Status: AC
  Filled 2014-12-20: qty 4

## 2014-12-20 MED ORDER — MIDAZOLAM HCL 2 MG/2ML IJ SOLN
INTRAMUSCULAR | Status: AC
  Filled 2014-12-20: qty 4

## 2014-12-20 SURGICAL SUPPLY — 12 items
CATH INFINITI 5 FR JL3.5 (CATHETERS) ×2 IMPLANT
CATH INFINITI 5FR ANG PIGTAIL (CATHETERS) ×2 IMPLANT
CATH INFINITI JR4 5F (CATHETERS) ×2 IMPLANT
DEVICE RAD COMP TR BAND LRG (VASCULAR PRODUCTS) ×2 IMPLANT
GLIDESHEATH SLEND SS 6F .021 (SHEATH) ×2 IMPLANT
KIT HEART LEFT (KITS) ×2 IMPLANT
PACK CARDIAC CATHETERIZATION (CUSTOM PROCEDURE TRAY) ×2 IMPLANT
SYR MEDRAD MARK V 150ML (SYRINGE) ×2 IMPLANT
TRANSDUCER W/STOPCOCK (MISCELLANEOUS) ×2 IMPLANT
TUBING CIL FLEX 10 FLL-RA (TUBING) ×2 IMPLANT
WIRE HI TORQ VERSACORE-J 145CM (WIRE) ×1 IMPLANT
WIRE SAFE-T 1.5MM-J .035X260CM (WIRE) ×2 IMPLANT

## 2014-12-20 NOTE — Interval H&P Note (Signed)
Cath Lab Visit (complete for each Cath Lab visit)  Clinical Evaluation Leading to the Procedure:   ACS: Yes.    Non-ACS:    Anginal Classification: CCS IV  Anti-ischemic medical therapy: Minimal Therapy (1 class of medications)  Non-Invasive Test Results: No non-invasive testing performed  Prior CABG: No previous CABG      History and Physical Interval Note:  12/20/2014 5:34 PM  Arbie Cookey  has presented today for surgery, with the diagnosis of cp  The various methods of treatment have been discussed with the patient and family. After consideration of risks, benefits and other options for treatment, the patient has consented to  Procedure(s): Left Heart Cath and Coronary Angiography (N/A) as a surgical intervention .  The patient's history has been reviewed, patient examined, no change in status, stable for surgery.  I have reviewed the patient's chart and labs.  Questions were answered to the patient's satisfaction.     Sherren Mocha

## 2014-12-20 NOTE — Care Management Note (Signed)
Case Management Note  Patient Details  Name: Lisa Parrish MRN: [id number] Date of Birth: June 11, 1960  Subjective/Objective:                  Pt admitted from home with unstable angina. Pt lives with her husband and will return home at discharge. Pt is independent with ADL's.  Action/Plan: Pt for transfer to [facility]. CM on receiving unit to follow for discharge planning needs.  Expected Discharge Date:  12/22/14               Expected Discharge Plan:  Acute to Acute Transfer  In-House Referral:  NA  Discharge planning Services  CM Consult  Post Acute Care Choice:  NA Choice offered to:  NA  DME Arranged:    DME Agency:     HH Arranged:    HH Agency:     Status of Service:  Completed, signed off  Medicare Important Message Given:    Date Medicare IM Given:    Medicare IM give by:    Date Additional Medicare IM Given:    Additional Medicare Important Message give by:     If discussed at [place] of Stay Meetings, dates discussed:    Additional Comments:  Joylene Draft, RN 12/20/2014, 2:54 PM

## 2014-12-20 NOTE — H&P (View-Only) (Signed)
Reason for Consult:chest pain Referring Physician:PTH Parrish:new Lisa Parrish is an 54 y.o. female.  HPI: This is a 54 year old female patient with history of hypertension and hyperlipidemia who was admitted with 5 hours of chest pain relieved with nitroglycerin and the emergency room. Troponins negative 3 but EKG shows T wave inversion in aVR and V1 through V3. She had the same EKG changes in 2014 and 03/2014.  Patient ate a taco salad about 4:30 yesterday. At 5:30 she developed chest tightness with nausea. No shortness of breath, diaphoresis, dizziness or presyncope. She went to urgent care and was sent to ER where it was relieved with NTG SL. She says taking a deep breath made it worse. She takes Prilosec for reflux, but says this wasn't anything like her reflux. 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 No exertional symptoms and never had this before. She has 10 pack yr history of smoking but quit 25 yrs ago. Her father died of an MI at 53 yrs. Last night she developed diaphoresis and was uncomfortable all over-no chest tightness, just didn't feel well. It lasted about 10 min-didn't alert the nurse. She now has a headache from NTG paste.  Past Medical History  Diagnosis Date  . Osteoarthritis     back  . Hyperlipidemia   . Hypertension   . Headache, migraine   . Anxiety   . Seasonal allergies   . Depression   . Kidney stones     Past Surgical History  Procedure Laterality Date  . Hemorrhoid surgery  1989  . Tubal ligation  1990  . Lumbar disckectomy  2006    Family History  Problem Relation Age of Onset  . Colon cancer Paternal Aunt 15    father's half sister  . Colon cancer Paternal Uncle 50    father's half brother  . Cancer Mother   . Heart disease Father     Social History:  reports that she quit smoking about 23 years ago. Her smoking use included Cigarettes. She has a 10 pack-year smoking  history. She has never used smokeless tobacco. She reports that she does not drink alcohol or use illicit drugs.  Allergies:  Allergies  Allergen Reactions  . Morphine And Related Nausea And Vomiting    Medications: Scheduled Meds: . aspirin EC  325 mg Oral Daily  . atorvastatin  40 mg Oral q1800  . buPROPion  300 mg Oral Daily  . docusate sodium  100 mg Oral BID  . enoxaparin (LOVENOX) injection  1 mg/kg Subcutaneous Q12H  . metoprolol succinate  50 mg Oral Daily  . pantoprazole  40 mg Oral BID AC  . sodium chloride  3 mL Intravenous Q12H   Continuous Infusions: . sodium chloride 1,000 mL (12/19/14 1955)  . dextrose 5 % and 0.9% NaCl 75 mL/hr at 12/20/14 0026   PRN Meds:.morphine injection, ondansetron **OR** ondansetron (ZOFRAN) IV   Results for orders placed or performed during the hospital encounter of 12/19/14 (from the past 48 hour(s))  APTT     Status: None   Collection Time: 12/19/14  7:59 PM  Result Value Ref Range   aPTT 26 24 - 37 seconds  CBC     Status: Abnormal   Collection Time: 12/19/14  7:59 PM  Result Value Ref Range   WBC 5.7 4.0 - 10.5 K/uL   RBC 3.98 3.87 - 5.11 MIL/uL   Hemoglobin 11.7 (L) 12.0 -  15.0 g/dL   HCT 35.1 (L) 36.0 - 46.0 %   MCV 88.2 78.0 - 100.0 fL   MCH 29.4 26.0 - 34.0 pg   MCHC 33.3 30.0 - 36.0 g/dL   RDW 13.4 11.5 - 15.5 %   Platelets 201 150 - 400 K/uL  Comprehensive metabolic panel     Status: None   Collection Time: 12/19/14  7:59 PM  Result Value Ref Range   Sodium 140 135 - 145 mmol/L   Potassium 3.5 3.5 - 5.1 mmol/L   Chloride 106 101 - 111 mmol/L   CO2 27 22 - 32 mmol/L   Glucose, Bld 94 65 - 99 mg/dL   BUN 20 6 - 20 mg/dL   Creatinine, Ser 0.73 0.44 - 1.00 mg/dL   Calcium 9.2 8.9 - 10.3 mg/dL   Total Protein 7.1 6.5 - 8.1 g/dL   Albumin 4.2 3.5 - 5.0 g/dL   AST 19 15 - 41 U/L   ALT 16 14 - 54 U/L   Alkaline Phosphatase 72 38 - 126 U/L   Total Bilirubin 0.5 0.3 - 1.2 mg/dL   GFR calc non Af Amer >60 >60 mL/min    GFR calc Af Amer >60 >60 mL/min    Comment: (NOTE) The eGFR has been calculated using the CKD EPI equation. This calculation has not been validated in all clinical situations. eGFR's persistently <60 mL/min signify possible Chronic Kidney Disease.    Anion gap 7 5 - 15  Protime-INR     Status: None   Collection Time: 12/19/14  7:59 PM  Result Value Ref Range   Prothrombin Time 13.7 11.6 - 15.2 seconds   INR 1.03 0.00 - 1.49  TSH     Status: None   Collection Time: 12/19/14  7:59 PM  Result Value Ref Range   TSH 1.398 0.350 - 4.500 uIU/mL  I-stat troponin, ED (0, 3, 6) - do not order at [facility]     Status: None   Collection Time: 12/19/14  8:02 PM  Result Value Ref Range   Troponin i, poc 0.00 0.00 - 0.08 ng/mL   Comment 3            Comment: Due to the release kinetics of cTnI, a negative result within the first hours of the onset of symptoms does not rule out myocardial infarction with certainty. If myocardial infarction is still suspected, repeat the test at appropriate intervals.   Troponin I     Status: None   Collection Time: 12/19/14 11:45 PM  Result Value Ref Range   Troponin I <0.03 <0.031 ng/mL    Comment:        NO INDICATION OF MYOCARDIAL INJURY.   Troponin I     Status: None   Collection Time: 12/20/14  5:30 AM  Result Value Ref Range   Troponin I <0.03 <0.031 ng/mL    Comment:        NO INDICATION OF MYOCARDIAL INJURY.   Lipid panel     Status: Abnormal   Collection Time: 12/20/14  5:30 AM  Result Value Ref Range   Cholesterol 181 0 - 200 mg/dL   Triglycerides 76 <150 mg/dL   HDL 46 >40 mg/dL   Total CHOL/HDL Ratio 3.9 RATIO   VLDL 15 0 - 40 mg/dL   LDL Cholesterol 120 (H) 0 - 99 mg/dL    Comment:        Total Cholesterol/HDL:CHD Risk Coronary Heart Disease Risk Table  Men   Women  1/2 Average Risk   3.4   3.3  Average Risk       5.0   4.4  2 X Average Risk   9.6   7.1  3 X Average Risk  23.4   11.0        Use the  calculated Patient Ratio above and the CHD Risk Table to determine the patient's CHD Risk.        ATP III CLASSIFICATION (LDL):  <100     mg/dL   Optimal  100-129  mg/dL   Near or Above                    Optimal  130-159  mg/dL   Borderline  160-189  mg/dL   High  >190     mg/dL   Very High     Dg Chest Portable 1 View  12/19/2014  CLINICAL DATA:  Central chest pain for 2 hours. EXAM: PORTABLE CHEST 1 VIEW COMPARISON:  02/24/2013 FINDINGS: Lung volumes are low. Cardiomediastinal contours are unchanged allowing for differences in technique. No pulmonary edema, confluent airspace disease, pleural effusion or pneumothorax. No acute osseous abnormalities are seen. IMPRESSION: Hypoventilatory chest without acute process. Electronically Signed   By: Jeb Levering M.D.   On: 12/19/2014 20:04    ROS  See HPI Eyes: Negative Ears:Negative for hearing loss, tinnitus Cardiovascular: Negative for  palpitations,irregular heartbeat, dyspnea, dyspnea on exertion, near-syncope, orthopnea, paroxysmal nocturnal dyspnea and syncope,edema, claudication, cyanosis,.  Respiratory:   Negative for cough, hemoptysis, shortness of breath, sleep disturbances due to breathing, sputum production and wheezing.   Endocrine: Negative for cold intolerance and heat intolerance.  Hematologic/Lymphatic: Negative for adenopathy and bleeding problem. Does not bruise/bleed easily.  Musculoskeletal: Negative.   Gastrointestinal: Negative for  vomiting, reflux, abdominal pain, diarrhea, constipation.   Genitourinary: Negative for bladder incontinence, dysuria, flank pain, frequency, hematuria, hesitancy, nocturia and urgency.  Neurological: Negative.  Allergic/Immunologic: Negative for environmental allergies.  Blood pressure 113/77, pulse 64, temperature 97.9 F (36.6 C), temperature source Oral, resp. rate 16, height $RemoveBe'5\' 5"'[id number]$  (1.651 m), weight 165 lb 9.1 oz (75.1 kg), SpO2 98 %. Physical Exam PHYSICAL EXAM:  Well-nournished, in no acute distress. Neck: No JVD, HJR, Bruit, or thyroid enlargement Lungs: No tachypnea, clear without wheezing, rales, or rhonchi Cardiovascular: RRR, PMI not displaced, positive S4, no murmurs,  bruit, thrill, or heave. Abdomen: BS normal. Soft without organomegaly, masses, lesions or tenderness. Extremities: without cyanosis, clubbing or edema. Good distal pulses bilateral SKin: Warm, no lesions or rashes  Musculoskeletal: No deformities Neuro: no focal signs    Assessment/Plan: Chest pain: worrisome for ischemia, abnormal EKG, but unchanged since 2014, Troponins negative. Multiple cardiac risk factors including HTN, Hyperlipidemia, family hx, smoking hx. Discussed possible cath vs stress myoview. Await echo.D/C NTG paste secondary to Headache.  HTN: controlled on Toprol XL 50 mg daily  Hyperlipidemia on lipitor  Family history of CAD  Former smoker  History of Anxiety    Ermalinda Barrios 12/20/2014, 8:02 AM   The patient was seen and examined, and I agree with the assessment and plan as documented above, with modifications as noted below. 54 yr old woman admitted with retrosternal chest pain yesterday evening, rated it at 9/10, currently 3/10 with nitro paste. Michela Pitcher it was different than any other pain she has experienced before and continues to have residual chest tightness. Has not noticed exertional chest pain but has had a decline in physical activity/energy  levels with fatigue over the past 2 months. Had an episode of diaphoresis last night, did not call nurse. ECG is abnormal with precordial T wave abnormalities dating back to 2014. Had more nonspecific ST abnormalities back then.  RECOMMENDATIONS: I spoke at length with her about different options of evaluating her chest discomfort and fatigue. I am concerned this represents unstable angina. Will transfer to [place] for coronary angiography to definitely exclude CAD. Already on ASA, metoprolol,  and Lipitor. Will hold Lovenox for cath today.

## 2014-12-20 NOTE — Progress Notes (Signed)
79mL removed from TR band at this time, cuff appears to be completely deflated.  Will leave band in place for about thirty minutes and then remove and cover with dressing.

## 2014-12-20 NOTE — Consult Note (Signed)
Reason for Consult:chest pain Referring Physician:PTH Cardiologist:new Lisa Parrish is an 54 y.o. female.  HPI: This is a 54 year old female patient with history of hypertension and hyperlipidemia who was admitted with 5 hours of chest pain relieved with nitroglycerin and the emergency room. Troponins negative 3 but EKG shows T wave inversion in aVR and V1 through V3. She had the same EKG changes in 2014 and 03/2014.  Patient ate a taco salad about 4:30 yesterday. At 5:30 she developed chest tightness with nausea. No shortness of breath, diaphoresis, dizziness or presyncope. She went to urgent care and was sent to ER where it was relieved with NTG SL. She says taking a deep breath made it worse. She takes Prilosec for reflux, but says this wasn't anything like her reflux. 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 No exertional symptoms and never had this before. She has 10 pack yr history of smoking but quit 25 yrs ago. Her father died of an MI at 20 yrs. Last night she developed diaphoresis and was uncomfortable all over-no chest tightness, just didn't feel well. It lasted about 10 min-didn't alert the nurse. She now has a headache from NTG paste.  Past Medical History  Diagnosis Date  . Osteoarthritis     back  . Hyperlipidemia   . Hypertension   . Headache, migraine   . Anxiety   . Seasonal allergies   . Depression   . Kidney stones     Past Surgical History  Procedure Laterality Date  . Hemorrhoid surgery  1989  . Tubal ligation  1990  . Lumbar disckectomy  2006    Family History  Problem Relation Age of Onset  . Colon cancer Paternal Aunt 57    father's half sister  . Colon cancer Paternal Uncle 34    father's half brother  . Cancer Mother   . Heart disease Father     Social History:  reports that she quit smoking about 23 years ago. Her smoking use included Cigarettes. She has a 10 pack-year smoking  history. She has never used smokeless tobacco. She reports that she does not drink alcohol or use illicit drugs.  Allergies:  Allergies  Allergen Reactions  . Morphine And Related Nausea And Vomiting    Medications: Scheduled Meds: . aspirin EC  325 mg Oral Daily  . atorvastatin  40 mg Oral q1800  . buPROPion  300 mg Oral Daily  . docusate sodium  100 mg Oral BID  . enoxaparin (LOVENOX) injection  1 mg/kg Subcutaneous Q12H  . metoprolol succinate  50 mg Oral Daily  . pantoprazole  40 mg Oral BID AC  . sodium chloride  3 mL Intravenous Q12H   Continuous Infusions: . sodium chloride 1,000 mL (12/19/14 1955)  . dextrose 5 % and 0.9% NaCl 75 mL/hr at 12/20/14 0026   PRN Meds:.morphine injection, ondansetron **OR** ondansetron (ZOFRAN) IV   Results for orders placed or performed during the hospital encounter of 12/19/14 (from the past 48 hour(s))  APTT     Status: None   Collection Time: 12/19/14  7:59 PM  Result Value Ref Range   aPTT 26 24 - 37 seconds  CBC     Status: Abnormal   Collection Time: 12/19/14  7:59 PM  Result Value Ref Range   WBC 5.7 4.0 - 10.5 K/uL   RBC 3.98 3.87 - 5.11 MIL/uL   Hemoglobin 11.7 (L) 12.0 -  15.0 g/dL   HCT 35.1 (L) 36.0 - 46.0 %   MCV 88.2 78.0 - 100.0 fL   MCH 29.4 26.0 - 34.0 pg   MCHC 33.3 30.0 - 36.0 g/dL   RDW 13.4 11.5 - 15.5 %   Platelets 201 150 - 400 K/uL  Comprehensive metabolic panel     Status: None   Collection Time: 12/19/14  7:59 PM  Result Value Ref Range   Sodium 140 135 - 145 mmol/L   Potassium 3.5 3.5 - 5.1 mmol/L   Chloride 106 101 - 111 mmol/L   CO2 27 22 - 32 mmol/L   Glucose, Bld 94 65 - 99 mg/dL   BUN 20 6 - 20 mg/dL   Creatinine, Ser 0.73 0.44 - 1.00 mg/dL   Calcium 9.2 8.9 - 10.3 mg/dL   Total Protein 7.1 6.5 - 8.1 g/dL   Albumin 4.2 3.5 - 5.0 g/dL   AST 19 15 - 41 U/L   ALT 16 14 - 54 U/L   Alkaline Phosphatase 72 38 - 126 U/L   Total Bilirubin 0.5 0.3 - 1.2 mg/dL   GFR calc non Af Amer >60 >60 mL/min    GFR calc Af Amer >60 >60 mL/min    Comment: (NOTE) The eGFR has been calculated using the CKD EPI equation. This calculation has not been validated in all clinical situations. eGFR's persistently <60 mL/min signify possible Chronic Kidney Disease.    Anion gap 7 5 - 15  Protime-INR     Status: None   Collection Time: 12/19/14  7:59 PM  Result Value Ref Range   Prothrombin Time 13.7 11.6 - 15.2 seconds   INR 1.03 0.00 - 1.49  TSH     Status: None   Collection Time: 12/19/14  7:59 PM  Result Value Ref Range   TSH 1.398 0.350 - 4.500 uIU/mL  I-stat troponin, ED (0, 3, 6) - do not order at [facility]     Status: None   Collection Time: 12/19/14  8:02 PM  Result Value Ref Range   Troponin i, poc 0.00 0.00 - 0.08 ng/mL   Comment 3            Comment: Due to the release kinetics of cTnI, a negative result within the first hours of the onset of symptoms does not rule out myocardial infarction with certainty. If myocardial infarction is still suspected, repeat the test at appropriate intervals.   Troponin I     Status: None   Collection Time: 12/19/14 11:45 PM  Result Value Ref Range   Troponin I <0.03 <0.031 ng/mL    Comment:        NO INDICATION OF MYOCARDIAL INJURY.   Troponin I     Status: None   Collection Time: 12/20/14  5:30 AM  Result Value Ref Range   Troponin I <0.03 <0.031 ng/mL    Comment:        NO INDICATION OF MYOCARDIAL INJURY.   Lipid panel     Status: Abnormal   Collection Time: 12/20/14  5:30 AM  Result Value Ref Range   Cholesterol 181 0 - 200 mg/dL   Triglycerides 76 <150 mg/dL   HDL 46 >40 mg/dL   Total CHOL/HDL Ratio 3.9 RATIO   VLDL 15 0 - 40 mg/dL   LDL Cholesterol 120 (H) 0 - 99 mg/dL    Comment:        Total Cholesterol/HDL:CHD Risk Coronary Heart Disease Risk Table  Men   Women  1/2 Average Risk   3.4   3.3  Average Risk       5.0   4.4  2 X Average Risk   9.6   7.1  3 X Average Risk  23.4   11.0        Use the  calculated Patient Ratio above and the CHD Risk Table to determine the patient's CHD Risk.        ATP III CLASSIFICATION (LDL):  <100     mg/dL   Optimal  100-129  mg/dL   Near or Above                    Optimal  130-159  mg/dL   Borderline  160-189  mg/dL   High  >190     mg/dL   Very High     Dg Chest Portable 1 View  12/19/2014  CLINICAL DATA:  Central chest pain for 2 hours. EXAM: PORTABLE CHEST 1 VIEW COMPARISON:  02/24/2013 FINDINGS: Lung volumes are low. Cardiomediastinal contours are unchanged allowing for differences in technique. No pulmonary edema, confluent airspace disease, pleural effusion or pneumothorax. No acute osseous abnormalities are seen. IMPRESSION: Hypoventilatory chest without acute process. Electronically Signed   By: Jeb Levering M.D.   On: 12/19/2014 20:04    ROS  See HPI Eyes: Negative Ears:Negative for hearing loss, tinnitus Cardiovascular: Negative for  palpitations,irregular heartbeat, dyspnea, dyspnea on exertion, near-syncope, orthopnea, paroxysmal nocturnal dyspnea and syncope,edema, claudication, cyanosis,.  Respiratory:   Negative for cough, hemoptysis, shortness of breath, sleep disturbances due to breathing, sputum production and wheezing.   Endocrine: Negative for cold intolerance and heat intolerance.  Hematologic/Lymphatic: Negative for adenopathy and bleeding problem. Does not bruise/bleed easily.  Musculoskeletal: Negative.   Gastrointestinal: Negative for  vomiting, reflux, abdominal pain, diarrhea, constipation.   Genitourinary: Negative for bladder incontinence, dysuria, flank pain, frequency, hematuria, hesitancy, nocturia and urgency.  Neurological: Negative.  Allergic/Immunologic: Negative for environmental allergies.  Blood pressure 113/77, pulse 64, temperature 97.9 F (36.6 C), temperature source Oral, resp. rate 16, height $RemoveBe'5\' 5"'[id number]$  (1.651 m), weight 165 lb 9.1 oz (75.1 kg), SpO2 98 %. Physical Exam PHYSICAL EXAM:  Well-nournished, in no acute distress. Neck: No JVD, HJR, Bruit, or thyroid enlargement Lungs: No tachypnea, clear without wheezing, rales, or rhonchi Cardiovascular: RRR, PMI not displaced, positive S4, no murmurs,  bruit, thrill, or heave. Abdomen: BS normal. Soft without organomegaly, masses, lesions or tenderness. Extremities: without cyanosis, clubbing or edema. Good distal pulses bilateral SKin: Warm, no lesions or rashes  Musculoskeletal: No deformities Neuro: no focal signs    Assessment/Plan: Chest pain: worrisome for ischemia, abnormal EKG, but unchanged since 2014, Troponins negative. Multiple cardiac risk factors including HTN, Hyperlipidemia, family hx, smoking hx. Discussed possible cath vs stress myoview. Await echo.D/C NTG paste secondary to Headache.  HTN: controlled on Toprol XL 50 mg daily  Hyperlipidemia on lipitor  Family history of CAD  Former smoker  History of Anxiety    Lisa Parrish 12/20/2014, 8:02 AM   The patient was seen and examined, and I agree with the assessment and plan as documented above, with modifications as noted below. 54 yr old woman admitted with retrosternal chest pain yesterday evening, rated it at 9/10, currently 3/10 with nitro paste. Lisa Parrish it was different than any other pain she has experienced before and continues to have residual chest tightness. Has not noticed exertional chest pain but has had a decline in physical activity/energy  levels with fatigue over the past 2 months. Had an episode of diaphoresis last night, did not call nurse. ECG is abnormal with precordial T wave abnormalities dating back to 2014. Had more nonspecific ST abnormalities back then.  RECOMMENDATIONS: I spoke at length with her about different options of evaluating her chest discomfort and fatigue. I am concerned this represents unstable angina. Will transfer to [facility] for coronary angiography to definitely exclude CAD. Already on ASA, metoprolol,  and Lipitor. Will hold Lovenox for cath today.

## 2014-12-20 NOTE — Progress Notes (Signed)
Patient briefly seen. Discussed case with Dr. Bronson Ing. Plan to transfer to [facility] for cardiac cath given unstable angina. Continue ASA, BB, lipitor. Lovenox on hold.  Domingo Mend, MD [facility] Pager: [phone]

## 2014-12-20 NOTE — Progress Notes (Signed)
TR band according to previous numbers should have had only 16mL's of air left.  RN removed 73mL's of air at this time and TR band cuff not completely deflated.  RN removed 1 more mL of air at this time and cuff still does not appear to be completely deflated.  RN will attempt to remove more air about thirty minutes from now.

## 2014-12-21 ENCOUNTER — Encounter (HOSPITAL_COMMUNITY): Payer: Self-pay | Admitting: Cardiovascular Disease

## 2014-12-21 DIAGNOSIS — I272 Other secondary pulmonary hypertension: Secondary | ICD-10-CM | POA: Diagnosis not present

## 2014-12-21 DIAGNOSIS — G4730 Sleep apnea, unspecified: Secondary | ICD-10-CM | POA: Diagnosis present

## 2014-12-21 DIAGNOSIS — R079 Chest pain, unspecified: Secondary | ICD-10-CM | POA: Diagnosis not present

## 2014-12-21 DIAGNOSIS — R0789 Other chest pain: Secondary | ICD-10-CM

## 2014-12-21 DIAGNOSIS — Z9889 Other specified postprocedural states: Secondary | ICD-10-CM

## 2014-12-21 HISTORY — DX: Other specified postprocedural states: Z98.890

## 2014-12-21 HISTORY — DX: Pulmonary hypertension, unspecified: I27.20

## 2014-12-21 MED ORDER — AZELASTINE HCL 0.05 % OP SOLN: 1.0000 [drp] | Freq: Every day | OPHTHALMIC | Status: DC | PRN

## 2014-12-21 MED ORDER — ACETAMINOPHEN 325 MG PO TABS: 650.0000 mg | ORAL_TABLET | ORAL | Status: DC | PRN

## 2014-12-21 MED ORDER — BUPROPION HCL ER (XL) 300 MG PO TB24: 300.0000 mg | ORAL_TABLET | Freq: Every day | ORAL | Status: DC

## 2014-12-21 MED ORDER — PANTOPRAZOLE SODIUM 40 MG PO TBEC: 40.0000 mg | DELAYED_RELEASE_TABLET | Freq: Two times a day (BID) | ORAL | Status: DC

## 2014-12-21 NOTE — Progress Notes (Signed)
Pt discharged home with spouse.  Reviewed discharge instructions and education, all questions answered.  Assessment unchanged from earlier. 

## 2014-12-21 NOTE — Plan of Care (Signed)
Problem: Phase II Progression Outcomes Goal: Anginal pain relieved Outcome: Completed/Met Date Met:  12/21/14 Patient has no complaints of pain at this time.

## 2014-12-21 NOTE — Discharge Instructions (Signed)
Call [facility] at [phone] if any bleeding, swelling or drainage at cath site.  May shower, no tub baths for 48 hours for groin sticks. No lifting over 5 pounds for 3 days.  No Driving for 3 days  We placed you on protonix twice a day for 1 month.  This may not have felt like your indigestion but it may have been.  After 1 month of taking go by what your primary provider tells you.  You need a sleep study to rule out sleep apnea, our office will call you to schedule and then you will follow up with Dr. Radford Pax who follows our sleep apnea patients.    Heart Healthy  Diet.  Follow up with your primary provider in next 1-2 weeks.  I will send her a copy of the discharge summary.

## 2014-12-21 NOTE — Progress Notes (Signed)
PROGRESS NOTE  Subjective:   54 yo female, Admitted with CP Cath shows normal coronaries Echo shows normal LV function but moderate pulmonary HTN Has hx of snoring - husband thinks she may have sleep apnea ( after I described sleep apnea to them )   Objective:    Vital Signs:   Temp:  [97.8 F (36.6 C)-98.4 F (36.9 C)] 97.9 F (36.6 C) (10/20 0423) Pulse Rate:  [59-79] 63 (10/20 0423) Resp:  [11-37] 12 (10/20 0423) BP: (112-169)/(67-100) 112/74 mmHg (10/20 0423) SpO2:  [90 %-99 %] 98 % (10/20 0423) Weight:  [72.848 kg (160 lb 9.6 oz)] 72.848 kg (160 lb 9.6 oz) (10/20 0425)  Last BM Date: 12/19/14   24-hour weight change: Weight change: -2.903 kg (-6 lb 6.4 oz)  Weight trends: Filed Weights   12/19/14 1925 12/19/14 2347 12/21/14 0425  Weight: 75.751 kg (167 lb) 75.1 kg (165 lb 9.1 oz) 72.848 kg (160 lb 9.6 oz)    Intake/Output:  10/19 0701 - 10/20 0700 In: 530 [P.O.:530] Out: 1950 [Urine:1950] Total I/O In: 360 [P.O.:360] Out: -    Physical Exam: BP 112/74 mmHg  Pulse 63  Temp(Src) 97.9 F (36.6 C) (Oral)  Resp 12  Ht 5\' 5"  (1.651 m)  Wt 72.848 kg (160 lb 9.6 oz)  BMI 26.73 kg/m2  SpO2 98%  Wt Readings from Last 3 Encounters:  12/21/14 72.848 kg (160 lb 9.6 oz)  03/29/14 80.74 kg (178 lb)  02/08/14 78.926 kg (174 lb)    General: Vital signs reviewed and noted.   Head: Normocephalic, atraumatic.  Eyes: conjunctivae/corneas clear.  EOM's intact.   Throat: normal  Neck:  normal   Lungs:  Clear   Heart:  RR  Abdomen:  Soft, non-tender, non-distended    Extremities: Right radial cath site is good    Neurologic: A&O X3, CN II - XII are grossly intact.   Psych: Normal     Labs: BMET:  Recent Labs  12/19/14 1959  NA 140  K 3.5  CL 106  CO2 27  GLUCOSE 94  BUN 20  CREATININE 0.73  CALCIUM 9.2    Liver function tests:  Recent Labs  12/19/14 1959  AST 19  ALT 16  ALKPHOS 72  BILITOT 0.5  PROT 7.1  ALBUMIN 4.2   No  results for input(s): LIPASE, AMYLASE in the last 72 hours.  CBC:  Recent Labs  12/19/14 1959  WBC 5.7  HGB 11.7*  HCT 35.1*  MCV 88.2  PLT 201    Cardiac Enzymes:  Recent Labs  12/19/14 2345 12/20/14 0530 12/20/14 1112  TROPONINI <0.03 <0.03 <0.03    Coagulation Studies:  Recent Labs  12/19/14 1959  LABPROT 13.7  INR 1.03    Other: Invalid input(s): POCBNP No results for input(s): DDIMER in the last 72 hours. No results for input(s): HGBA1C in the last 72 hours.  Recent Labs  12/20/14 0530  CHOL 181  HDL 46  LDLCALC 120*  TRIG 76  CHOLHDL 3.9    Recent Labs  12/19/14 1959  TSH 1.398   No results for input(s): VITAMINB12, FOLATE, FERRITIN, TIBC, IRON, RETICCTPCT in the last 72 hours.   Other results:  EKG  ( personally reviewed ) tele - NSR   Medications:    Infusions: . sodium chloride 1,000 mL (12/19/14 1955)    Scheduled Medications: . atorvastatin  40 mg Oral q1800  . buPROPion  300 mg Oral Daily  . docusate sodium  100 mg Oral BID  . metoprolol succinate  50 mg Oral Daily  . pantoprazole  40 mg Oral BID AC  . sodium chloride  3 mL Intravenous Q12H  . sodium chloride  3 mL Intravenous Q12H    Assessment/ Plan:   Principal Problem:   Unstable angina pectoris (HCC) Active Problems:   HTN (hypertension)   Hyperlipidemia   Unstable angina (HCC)   Pain in the chest   1. Chest pain ;  Non cardiac, normal cors by cath   Right radial cath site looks good.  Will need to be on light duty for a week ( nothing more than 5 lbs with right hand for a week )   2. Pulmonary HTN - moderate by echo .  By hx , i think she has sleep apnea.  Needs a sleep study  I think she needs a follow up echo in a year - sooner if she has any symptoms   Ok for DC today    Disposition:  Length of Stay: 1  Thayer Headings, Brooke Bonito., MD, [facility] 12/21/2014, 10:41 AM Office [phone] Pager [phone]

## 2014-12-21 NOTE — Discharge Summary (Signed)
Physician Discharge Summary       Patient ID: Lisa Parrish MRN: [id number] DOB/AGE: Jul 27, 1960 54 y.o.  Admit date: 12/19/2014 Discharge date: 12/21/2014 Primary Cardiologist:Dr. Radford Pax for sleep and cardiac    Discharge Diagnoses:  Principal Problem:   Pain in the chest, non cardiac, possible GI  Active Problems:   Pulmonary HTN ([place])   S/P cardiac cath 12/20/14 normal coronary arteries   HTN (hypertension)   Hyperlipidemia   Sleep apnea in adult   Discharged Condition: good  Procedures: 12/20/14 cardiac cath by Dr. Tyrell Antonio Course: 54 year old female with history of hypertension and hyperlipidemia who was admitted with 5 hours of chest pain relieved with nitroglycerin and the emergency room. Troponins negative 3 but EKG shows T wave inversion in aVR and V1 through V3. She had the same EKG changes in 2014 and 03/2014.  She was seen at [facility] 12/20/14 for chest pain after eating a taco salad.  + chest tightness and nausea.  This was different than her usual reflux.  She was transferred to [facility] for cardiac cath which fortunately revealed no CAD.  Her Echo does find moderate Pulmonary HTN.  She may have sleep apnea by description and with the Pulmonary HTN we have recommended sleep study- our office will contact.  I will have her followed by Dr. Radford Pax for her Pulmonary HTN and presumed sleep apnea.    She will follow up with PCP in next 1-2 weeks.  Her pain may have been GI though different from her usual.  Now on protonix BID with further recommendations from her primary provider.   She will be out of work until the 24th Oct and then only light duty until the 31st of Oct.     Consults: cardiology  Significant Diagnostic Studies:  BMP Latest Ref Rng 12/19/2014 06/13/2013 06/12/2013  Glucose 65 - 99 mg/dL 94 109(H) 104(H)  BUN 6 - 20 mg/dL 20 14 15   Creatinine 0.44 - 1.00 mg/dL 0.73 0.61 0.66  Sodium 135 - 145 mmol/L 140 138 134(L)  Potassium 3.5 - 5.1 mmol/L 3.5 3.8  3.6(L)  Chloride 101 - 111 mmol/L 106 100 95(L)  CO2 22 - 32 mmol/L 27 28 29   Calcium 8.9 - 10.3 mg/dL 9.2 8.8 9.3   CBC Latest Ref Rng 12/19/2014 03/29/2014 06/13/2013  WBC 4.0 - 10.5 K/uL 5.7 6.9 6.5  Hemoglobin 12.0 - 15.0 g/dL 11.7(L) 12.8 10.8(L)  Hematocrit 36.0 - 46.0 % 35.1(L) 38.3 32.7(L)  Platelets 150 - 400 K/uL 201 277 218    Troponin neg  <0.30 X 4  Lipid Panel     Component Value Date/Time   CHOL 181 12/20/2014 0530   TRIG 76 12/20/2014 0530   HDL 46 12/20/2014 0530   CHOLHDL 3.9 12/20/2014 0530   VLDL 15 12/20/2014 0530   LDLCALC 120* 12/20/2014 0530   TSH  1.398  PORTABLE CHEST 1 VIEW COMPARISON: 02/24/2013 FINDINGS: Lung volumes are low. Cardiomediastinal contours are unchanged allowing for differences in technique. No pulmonary edema, confluent airspace disease, pleural effusion or pneumothorax. No acute osseous abnormalities are seen. IMPRESSION: Hypoventilatory chest without acute process.  ECHO: Study Conclusions  - Left ventricle: The cavity size was normal. Wall thickness was normal. Systolic function was normal. The estimated ejection fraction was in the range of 60% to 65%. Wall motion was normal; there were no regional wall motion abnormalities. Diastolic dysfunction, indeterminate grade. - Mitral valve: There was mild regurgitation. - Left atrium: The atrium was moderately dilated.  Volume/bsa, S: 35.4 ml/m^2. - Right atrium: The atrium was mildly dilated. - Tricuspid valve: There was mild regurgitation. - Pulmonic valve: There was mild regurgitation. - Pulmonary arteries: Systolic pressure was mildly to moderately increased. PA peak pressure: 46 mm Hg (S). - Systemic veins: IVC dilated with normal respiratory variation. Estimated CVP 8 mmHg.  CARDIAC CATH: Coronary Findings    Dominance: Left   Left Anterior Descending  The vessel is angiographically normal.     Left Circumflex  The vessel is angiographically  normal.     Right Coronary Artery  The vessel is smallis angiographically normal.      Wall Motion                 Left Heart    Left Ventricle The left ventricular size is normal. The left ventricular systolic function is normal. The left ventricular ejection fraction is 55-65% by visual estimate.    Coronary Diagrams    Diagnostic Diagram               Discharge Exam: Blood pressure 112/74, pulse 63, temperature 97.9 F (36.6 C), temperature source Oral, resp. rate 12, height 5\' 5"  (1.651 m), weight 160 lb 9.6 oz (72.848 kg), SpO2 98 %.   Disposition: 01-Home or Self Care     Medication List    TAKE these medications        acetaminophen 325 MG tablet  Commonly known as:  TYLENOL  Take 2 tablets (650 mg total) by mouth every 4 (four) hours as needed for headache or mild pain.     ADVIL COLD/SINUS 30-200 MG Tabs  Generic drug:  Pseudoephedrine-Ibuprofen  Take 1 tablet by mouth daily as needed (for sinus/allergy).     azelastine 0.05 % ophthalmic solution  Commonly known as:  OPTIVAR  Place 1 drop into both eyes daily as needed (for allergy eye).     buPROPion 300 MG 24 hr tablet  Commonly known as:  WELLBUTRIN XL  Take 1 tablet (300 mg total) by mouth daily.     HAIR/SKIN/NAILS PO  Take 1 tablet by mouth daily.     metoprolol succinate 50 MG 24 hr tablet  Commonly known as:  TOPROL-XL  Take 1 tablet (50 mg total) by mouth daily. Take with or immediately following a meal.     pantoprazole 40 MG tablet  Commonly known as:  PROTONIX  Take 1 tablet (40 mg total) by mouth 2 (two) times daily before a meal.     pravastatin 10 MG tablet  Commonly known as:  PRAVACHOL  Take 1 tablet (10 mg total) by mouth at bedtime.       Follow-up Information    Follow up with Sueanne Margarita, MD.   Specialty:  Cardiology   Why:  our office will call for sleep study and follow up.     Contact information:   [address] [place]  [address] [phone]       Schedule an appointment as soon as possible for a visit with Chevis Pretty, [place].   Specialty:  Family Medicine   Why:  for next 1-2 weeks.   Contact information:   [address] [place] [address] [phone]        Discharge Instructions: Call [facility] at [phone] if any bleeding, swelling or drainage at cath site.  May shower, no tub baths for 48 hours for groin sticks. No lifting over 5 pounds for 3  days.  No Driving for 3 days  We placed you on protonix twice a day for 1 month.  This may not have felt like your indigestion but it may have been.  After 1 month of taking go by what your primary provider tells you.  You need a sleep study to rule out sleep apnea, our office will call you to schedule and then you will follow up with Dr. Radford Pax who follows our sleep apnea patients.    Heart Healthy  Diet.  Follow up with your primary provider in next 1-2 weeks.  I will send her a copy of the discharge summary.   Signed: Isaiah Serge Nurse Practitioner-Certified [facility]: [facility] 12/21/2014, 2:24 PM  Time spent on discharge :> 30 minutes.    Attending Note:   The patient was seen and examined.  Agree with assessment and plan as noted above.  Changes made to the above note as needed.  See my note from same day    Ramond Dial., MD, [facility] 12/21/2014, 6:04 PM [address]  [place] Pager [phone]

## 2015-01-01 ENCOUNTER — Other Ambulatory Visit: Payer: Self-pay | Admitting: *Deleted

## 2015-01-01 DIAGNOSIS — G4733 Obstructive sleep apnea (adult) (pediatric): Secondary | ICD-10-CM

## 2015-01-17 ENCOUNTER — Other Ambulatory Visit: Payer: Self-pay | Admitting: Nurse Practitioner

## 2015-01-18 ENCOUNTER — Encounter: Payer: Self-pay | Admitting: Nurse Practitioner

## 2015-01-18 ENCOUNTER — Ambulatory Visit (INDEPENDENT_AMBULATORY_CARE_PROVIDER_SITE_OTHER): Payer: BLUE CROSS/BLUE SHIELD | Admitting: Nurse Practitioner

## 2015-01-18 VITALS — BP 143/95 | HR 67 | Temp 97.3°F | Ht 65.0 in | Wt 167.0 lb

## 2015-01-18 DIAGNOSIS — Z1159 Encounter for screening for other viral diseases: Secondary | ICD-10-CM | POA: Diagnosis not present

## 2015-01-18 DIAGNOSIS — E785 Hyperlipidemia, unspecified: Secondary | ICD-10-CM | POA: Diagnosis not present

## 2015-01-18 DIAGNOSIS — Z1212 Encounter for screening for malignant neoplasm of rectum: Secondary | ICD-10-CM

## 2015-01-18 DIAGNOSIS — I10 Essential (primary) hypertension: Secondary | ICD-10-CM | POA: Diagnosis not present

## 2015-01-18 DIAGNOSIS — Z6827 Body mass index (BMI) 27.0-27.9, adult: Secondary | ICD-10-CM | POA: Diagnosis not present

## 2015-01-18 DIAGNOSIS — F329 Major depressive disorder, single episode, unspecified: Secondary | ICD-10-CM

## 2015-01-18 DIAGNOSIS — K219 Gastro-esophageal reflux disease without esophagitis: Secondary | ICD-10-CM

## 2015-01-18 DIAGNOSIS — F411 Generalized anxiety disorder: Secondary | ICD-10-CM

## 2015-01-18 DIAGNOSIS — F32A Depression, unspecified: Secondary | ICD-10-CM

## 2015-01-18 LAB — COMPLETE METABOLIC PANEL WITH GFR
ALT: 12 U/L (ref 6–29)
AST: 14 U/L (ref 10–35)
Albumin: 4.3 g/dL (ref 3.6–5.1)
Alkaline Phosphatase: 71 U/L (ref 33–130)
BUN: 23 mg/dL (ref 7–25)
CO2: 28 mmol/L (ref 20–31)
Calcium: 8.8 mg/dL (ref 8.6–10.4)
Chloride: 105 mmol/L (ref 98–110)
Creat: 0.77 mg/dL (ref 0.50–1.05)
GFR, Est African American: >89 mL/min (ref >=60)
GFR, Est Non African American: 88 mL/min (ref >=60)
Glucose, Bld: 78 mg/dL (ref 70–99)
Potassium: 4.1 mmol/L (ref 3.5–5.3)
Sodium: 140 mmol/L (ref 135–146)
Total Bilirubin: 0.4 mg/dL (ref 0.2–1.2)
Total Protein: 6.4 g/dL (ref 6.1–8.1)

## 2015-01-18 MED ORDER — BUPROPION HCL ER (XL) 300 MG PO TB24: 300.0000 mg | ORAL_TABLET | Freq: Every day | ORAL | Status: DC

## 2015-01-18 MED ORDER — PRAVASTATIN SODIUM 10 MG PO TABS: 10.0000 mg | ORAL_TABLET | Freq: Every day | ORAL | Status: DC

## 2015-01-18 MED ORDER — LISINOPRIL 10 MG PO TABS: 10.0000 mg | ORAL_TABLET | Freq: Every day | ORAL | Status: DC

## 2015-01-18 MED ORDER — PANTOPRAZOLE SODIUM 40 MG PO TBEC: 40.0000 mg | DELAYED_RELEASE_TABLET | Freq: Two times a day (BID) | ORAL | Status: DC

## 2015-01-18 NOTE — Patient Instructions (Signed)
Hypertension Hypertension, commonly called high blood pressure, is when the force of blood pumping through your arteries is too strong. Your arteries are the blood vessels that carry blood from your heart throughout your body. A blood pressure reading consists of a higher number over a lower number, such as 110/72. The higher number (systolic) is the pressure inside your arteries when your heart pumps. The lower number (diastolic) is the pressure inside your arteries when your heart relaxes. Ideally you want your blood pressure below 120/80. Hypertension forces your heart to work harder to pump blood. Your arteries may become narrow or stiff. Having untreated or uncontrolled hypertension can cause heart attack, stroke, kidney disease, and other problems. RISK FACTORS Some risk factors for high blood pressure are controllable. Others are not.  Risk factors you cannot control include:   Race. You may be at higher risk if you are African American.  Age. Risk increases with age.  Gender. Men are at higher risk than women before age 45 years. After age 65, women are at higher risk than men. Risk factors you can control include:  Not getting enough exercise or physical activity.  Being overweight.  Getting too much fat, sugar, calories, or salt in your diet.  Drinking too much alcohol. SIGNS AND SYMPTOMS Hypertension does not usually cause signs or symptoms. Extremely high blood pressure (hypertensive crisis) may cause headache, anxiety, shortness of breath, and nosebleed. DIAGNOSIS To check if you have hypertension, your health care provider will measure your blood pressure while you are seated, with your arm held at the level of your heart. It should be measured at least twice using the same arm. Certain conditions can cause a difference in blood pressure between your right and left arms. A blood pressure reading that is higher than normal on one occasion does not mean that you need treatment. If  it is not clear whether you have high blood pressure, you may be asked to return on a different day to have your blood pressure checked again. Or, you may be asked to monitor your blood pressure at home for 1 or more weeks. TREATMENT Treating high blood pressure includes making lifestyle changes and possibly taking medicine. Living a healthy lifestyle can help lower high blood pressure. You may need to change some of your habits. Lifestyle changes may include:  Following the DASH diet. This diet is high in fruits, vegetables, and whole grains. It is low in salt, red meat, and added sugars.  Keep your sodium intake below 2,300 mg per day.  Getting at least 30-45 minutes of aerobic exercise at least 4 times per week.  Losing weight if necessary.  Not smoking.  Limiting alcoholic beverages.  Learning ways to reduce stress. Your health care provider may prescribe medicine if lifestyle changes are not enough to get your blood pressure under control, and if one of the following is true:  You are 18-59 years of age and your systolic blood pressure is above 140.  You are 60 years of age or older, and your systolic blood pressure is above 150.  Your diastolic blood pressure is above 90.  You have diabetes, and your systolic blood pressure is over 140 or your diastolic blood pressure is over 90.  You have kidney disease and your blood pressure is above 140/90.  You have heart disease and your blood pressure is above 140/90. Your personal target blood pressure may vary depending on your medical conditions, your age, and other factors. HOME CARE INSTRUCTIONS    Have your blood pressure rechecked as directed by your health care provider.   Take medicines only as directed by your health care provider. Follow the directions carefully. Blood pressure medicines must be taken as prescribed. The medicine does not work as well when you skip doses. Skipping doses also puts you at risk for  problems.  Do not smoke.   Monitor your blood pressure at home as directed by your health care provider. SEEK MEDICAL CARE IF:   You think you are having a reaction to medicines taken.  You have recurrent headaches or feel dizzy.  You have swelling in your ankles.  You have trouble with your vision. SEEK IMMEDIATE MEDICAL CARE IF:  You develop a severe headache or confusion.  You have unusual weakness, numbness, or feel faint.  You have severe chest or abdominal pain.  You vomit repeatedly.  You have trouble breathing. MAKE SURE YOU:   Understand these instructions.  Will watch your condition.  Will get help right away if you are not doing well or get worse.   This information is not intended to replace advice given to you by your health care provider. Make sure you discuss any questions you have with your health care provider.   Document Released: 02/17/2005 Document Revised: 07/04/2014 Document Reviewed: 12/10/2012 Elsevier Interactive Patient Education 2016 [employer]  

## 2015-01-18 NOTE — Addendum Note (Signed)
Addended by: Earlene Plater on: 01/18/2015 04:34 PM   Modules accepted: Orders

## 2015-01-18 NOTE — Progress Notes (Signed)
Subjective:    Patient ID: Lisa Parrish, female    DOB: 22-Jul-1960, 54 y.o.   MRN: [id number]   Patient here today for follow up of chronic medical problems.   Hypertension This is a chronic problem. The current episode started more than 1 year ago. The problem has been waxing and waning since onset. The problem is controlled. Risk factors for coronary artery disease include dyslipidemia, obesity and post-menopausal state. Past treatments include calcium channel blockers. The current treatment provides moderate improvement. Compliance problems include diet and exercise.  There is no history of CAD/MI.  Hyperlipidemia This is a chronic problem. The current episode started more than 1 year ago. The problem is uncontrolled. Recent lipid tests were reviewed and are high. Pertinent negatives include no myalgias. Current antihyperlipidemic treatment includes statins. The current treatment provides moderate improvement of lipids. Compliance problems include adherence to diet and adherence to exercise.  Risk factors for coronary artery disease include dyslipidemia, hypertension, obesity and post-menopausal.  GAD/depression wellbutrin working well. No complaint of side effects GERD protonix works well to keep symptoms under control.   Review of Systems  Constitutional: Negative.   HENT: Negative.   Respiratory: Negative.   Cardiovascular: Negative.   Genitourinary: Negative.   Musculoskeletal: Negative for myalgias.  Neurological: Negative.   Psychiatric/Behavioral: Negative.   All other systems reviewed and are negative.      Objective:   Physical Exam  Constitutional: She is oriented to person, place, and time. She appears well-developed and well-nourished.  HENT:  Nose: Nose normal.  Mouth/Throat: Oropharynx is clear and moist.  Eyes: EOM are normal.  Neck: Trachea normal, normal range of motion and full passive range of motion without pain. Neck supple. No JVD present. Carotid bruit  is not present. No thyromegaly present.  Cardiovascular: Normal rate, regular rhythm, normal heart sounds and intact distal pulses.  Exam reveals no gallop and no friction rub.   No murmur heard. Pulmonary/Chest: Effort normal and breath sounds normal.  Abdominal: Soft. Bowel sounds are normal. She exhibits no distension and no mass. There is no tenderness.  Musculoskeletal: Normal range of motion.  Lymphadenopathy:    She has no cervical adenopathy.  Neurological: She is alert and oriented to person, place, and time. She has normal reflexes.  Skin: Skin is warm and dry.  Psychiatric: She has a normal mood and affect. Her behavior is normal. Judgment and thought content normal.    BP 143/95 mmHg  Pulse 67  Temp(Src) 97.3 F (36.3 C) (Oral)  Ht '5\' 5"'  (1.651 m)  Wt 167 lb (75.751 kg)  BMI 27.79 kg/m2       Assessment & Plan:  1. Essential hypertension Do not add salt to diet Added lisinopril to meds - CMP14+EGFR - lisinopril (PRINIVIL,ZESTRIL) 10 MG tablet; Take 1 tablet (10 mg total) by mouth daily.  Dispense: 90 tablet; Refill: 3  2. Hyperlipidemia Low fat diet - pravastatin (PRAVACHOL) 10 MG tablet; Take 1 tablet (10 mg total) by mouth at bedtime.  Dispense: 90 tablet; Refill: 4 - Lipid panel  3. Anxiety state Stress management  4. BMI 27.0-27.9,adult Discussed diet and exercise for person with BMI >25 Will recheck weight in 3-6 months  5. Depression - buPROPion (WELLBUTRIN XL) 300 MG 24 hr tablet; Take 1 tablet (300 mg total) by mouth daily.  Dispense: 90 tablet; Refill: 0  6. Gastroesophageal reflux disease without esophagitis Avoid spicy foods Do not eat 2 hours prior to bedtime - pantoprazole (PROTONIX) 40 MG  tablet; Take 1 tablet (40 mg total) by mouth 2 (two) times daily before a meal.  Dispense: 60 tablet; Refill: 0  7. Screening for malignant neoplasm of the rectum - Fecal occult blood, imunochemical; Future  8. Need for hepatitis C screening test -  Hepatitis C antibody    Labs pending Health maintenance reviewed Diet and exercise encouraged  Continue all meds Follow up  In 1 month  [place], FNP

## 2015-01-19 LAB — HEPATITIS C ANTIBODY: HCV Ab: NEGATIVE

## 2015-01-19 LAB — LIPID PANEL W/DIRECT LDL/HDL RATIO
Cholesterol: 188 mg/dL (ref 125–200)
Direct LDL: 124 mg/dL (ref <130)
HDL: 52 mg/dL (ref >=46)
LDL:HDL Ratio: 2.4 Ratio
Total Chol/HDL Ratio: 3.6 Ratio (ref <=5.0)
Triglycerides: 95 mg/dL (ref <150)

## 2015-01-20 ENCOUNTER — Other Ambulatory Visit: Payer: Self-pay | Admitting: Nurse Practitioner

## 2015-01-21 ENCOUNTER — Other Ambulatory Visit: Payer: Self-pay | Admitting: Physician Assistant

## 2015-01-31 ENCOUNTER — Ambulatory Visit (HOSPITAL_BASED_OUTPATIENT_CLINIC_OR_DEPARTMENT_OTHER): Payer: BLUE CROSS/BLUE SHIELD | Attending: Cardiology | Admitting: Radiology

## 2015-01-31 VITALS — Ht 66.0 in | Wt 160.0 lb

## 2015-01-31 DIAGNOSIS — G4733 Obstructive sleep apnea (adult) (pediatric): Secondary | ICD-10-CM

## 2015-01-31 DIAGNOSIS — R0683 Snoring: Secondary | ICD-10-CM | POA: Diagnosis not present

## 2015-01-31 DIAGNOSIS — I10 Essential (primary) hypertension: Secondary | ICD-10-CM | POA: Diagnosis not present

## 2015-01-31 DIAGNOSIS — Z79899 Other long term (current) drug therapy: Secondary | ICD-10-CM | POA: Diagnosis not present

## 2015-02-01 ENCOUNTER — Telehealth: Payer: Self-pay | Admitting: Cardiology

## 2015-02-01 DIAGNOSIS — R0683 Snoring: Secondary | ICD-10-CM | POA: Insufficient documentation

## 2015-02-01 NOTE — Telephone Encounter (Signed)
Please let patient know that sleep study showed no significant sleep apnea.    

## 2015-02-01 NOTE — Telephone Encounter (Signed)
Patient is aware of results and stated verbal understanding

## 2015-02-01 NOTE — Sleep Study (Signed)
   Patient Name: Delone, Mclarty MRN: [id number] Study Date: 01/31/2015 Gender: Female D.O.B: 1960/11/20 Age (years): 83 Referring Provider: Cecilie Kicks Interpreting Physician: Fransico Him MD, ABSM RPSGT: Carolin Coy  Weight (lbs): 160 Height (inches): 65 BMI: 27 Neck Size: 13.50  CLINICAL INFORMATION Sleep Study Type: NPSG Indication for sleep study: Hypertension, OSA, Snoring Epworth Sleepiness Score: 5  SLEEP STUDY TECHNIQUE As per the AASM Manual for the Scoring of Sleep and Associated Events v2.3 (April 2016) with a hypopnea requiring 4% desaturations. The channels recorded and monitored were frontal, central and occipital EEG, electrooculogram (EOG), submentalis EMG (chin), nasal and oral airflow, thoracic and abdominal wall motion, anterior tibialis EMG, snore microphone, electrocardiogram, and pulse oximetry.  MEDICATIONS Patient's medications include: Optivar, Wellbutrin, Lisinopril, Metoprolol, Protonix, Pravachol. Medications self-administered by patient during sleep study : No sleep medicine administered.  SLEEP ARCHITECTURE The study was initiated at 9:44:24 PM and ended at 3:54:45 AM. Sleep onset time was 19.3 minutes and the sleep efficiency was 88.2%. The total sleep time was 326.5 minutes. Stage REM latency was 136.5 minutes. The patient spent 6.28% of the night in stage N1 sleep, 73.20% in stage N2 sleep, 0.46% in stage N3 and 20.06% in REM. Alpha intrusion was absent. Supine sleep was 0.00% . RESPIRATORY PARAMETERS The overall apnea/hypopnea index (AHI) was 1.3 per hour. There were 5 total apneas, including 4 obstructive, 1 central and 0 mixed apneas. There were 2 hypopneas and 18 RERAs. The AHI during Stage REM sleep was 2.7 per hour. AHI while supine was N/A per hour. The mean oxygen saturation was 95.16%. The minimum SpO2 during sleep was 91.00%. Moderate snoring was noted during this study. CARDIAC DATA The 2 lead EKG demonstrated sinus rhythm.  The mean heart rate was 62.77 beats per minute. Other EKG findings include: None.  LEG MOVEMENT DATA The total PLMS were 319 with a resulting PLMS index of 58.62. Associated arousal with leg movement index was 3.1 .  IMPRESSIONS - No significant obstructive sleep apnea occurred during this study (AHI = 1.3/h). - No significant central sleep apnea occurred during this study (CAI = 0.2/h). - The patient had minimal or no oxygen desaturation during the study (Min O2 = 91.00%) - The patient snored with Moderate snoring volume. - No cardiac abnormalities were noted during this study. - Severe periodic limb movements of sleep occurred during the study. No significant associated arousals  DIAGNOSIS Snoring   RECOMMENDATIONS - Avoid alcohol, sedatives and other CNS depressants that may result in sleep apnea and disrupt normal sleep architecture. - Sleep hygiene should be reviewed to assess factors that may improve sleep quality. - Weight management and regular exercise should be initiated or continued if appropriate.   [place], American Board of Sleep Medicine  ELECTRONICALLY SIGNED ON:  02/01/2015, 1:21 PM [place] PH: [phone]   FX: [phone] [place]

## 2015-02-08 ENCOUNTER — Encounter: Payer: Self-pay | Admitting: Pediatrics

## 2015-02-08 ENCOUNTER — Ambulatory Visit (INDEPENDENT_AMBULATORY_CARE_PROVIDER_SITE_OTHER): Payer: BLUE CROSS/BLUE SHIELD | Admitting: Pediatrics

## 2015-02-08 VITALS — BP 127/78 | HR 68 | Temp 97.0°F | Ht 66.0 in | Wt 168.0 lb

## 2015-02-08 DIAGNOSIS — R05 Cough: Secondary | ICD-10-CM

## 2015-02-08 DIAGNOSIS — J019 Acute sinusitis, unspecified: Secondary | ICD-10-CM

## 2015-02-08 DIAGNOSIS — R059 Cough, unspecified: Secondary | ICD-10-CM

## 2015-02-08 MED ORDER — AZITHROMYCIN 250 MG PO TABS: ORAL_TABLET | ORAL | Status: DC

## 2015-02-08 MED ORDER — GUAIFENESIN-CODEINE 100-10 MG/5ML PO SOLN: 5.0000 mL | Freq: Every evening | ORAL | Status: DC | PRN

## 2015-02-08 NOTE — Progress Notes (Signed)
Subjective:    Patient ID: Lisa Parrish, female    DOB: [birth year], 54 y.o.   MRN: [id number]  CC: Cough; Nasal Congestion; and Headache   HPI: Lisa Parrish is a 54 y.o. female presenting for Cough; Nasal Congestion; and Headache  Congestion for about a week Cough got really bad today Congestion throat and head Some sore throat Lots of throat clearing Quit smoking about 25 yrs ago Has used cough syrup with codeine in the past and has helped with cough at night a lot  Depression screen [facility] 2/9 02/08/2015 01/18/2015 03/29/2014 02/08/2014 08/29/2013  Decreased Interest 0 0 0 0 0  Down, Depressed, Hopeless 0 0 0 0 1  PHQ - 2 Score 0 0 0 0 1     Relevant past medical, surgical, family and social history reviewed and updated as indicated. Interim medical history since our last visit reviewed. Allergies and medications reviewed and updated.    ROS: Per HPI unless specifically indicated above  History  Smoking status  . Former Smoker -- 1.00 packs/day for 10 years  . Types: Cigarettes  . Quit date: 09/18/1991  Smokeless tobacco  . Never Used    Past Medical History Patient Active Problem List   Diagnosis Date Noted  . Snoring 02/01/2015  . Pulmonary HTN ([place]) 12/21/2014  . Sleep apnea in adult 12/21/2014  . S/P cardiac cath 12/20/14 normal coronary arteries 12/21/2014  . Pain in the chest, non cardiac, possible GI    . Hyperlipidemia 12/19/2014  . Facial cellulitis 06/12/2013  . Acute sinusitis 06/12/2013  . Chronic pain 06/12/2013  . HTN (hypertension) 08/24/2012  . Migraines 08/24/2012  . Anxiety state 08/24/2012    Current Outpatient Prescriptions  Medication Sig Dispense Refill  . acetaminophen (TYLENOL) 325 MG tablet Take 2 tablets (650 mg total) by mouth every 4 (four) hours as needed for headache or mild pain.    Marland Kitchen azelastine (OPTIVAR) 0.05 % ophthalmic solution Place 1 drop into both eyes daily as needed (for allergy eye). 6 mL 12  . buPROPion (WELLBUTRIN  XL) 300 MG 24 hr tablet Take 1 tablet (300 mg total) by mouth daily. 90 tablet 0  . lisinopril (PRINIVIL,ZESTRIL) 10 MG tablet Take 1 tablet (10 mg total) by mouth daily. 90 tablet 3  . metoprolol succinate (TOPROL-XL) 50 MG 24 hr tablet TAKE 1 TABLET (50 MG TOTAL) BY MOUTH DAILY. TAKE WITH OR IMMEDIATELY FOLLOWING A MEAL. 90 tablet 0  . Multiple Vitamins-Minerals (HAIR/SKIN/NAILS PO) Take 1 tablet by mouth daily.    . pantoprazole (PROTONIX) 40 MG tablet Take 1 tablet (40 mg total) by mouth 2 (two) times daily before a meal. 60 tablet 0  . pravastatin (PRAVACHOL) 10 MG tablet Take 1 tablet (10 mg total) by mouth at bedtime. 90 tablet 4  . Pseudoephedrine-Ibuprofen (ADVIL COLD/SINUS) 30-200 MG TABS Take 1 tablet by mouth daily as needed (for sinus/allergy).    Marland Kitchen azithromycin (ZITHROMAX) 250 MG tablet Take 2 the first day and then one each day after. 6 tablet 0  . guaiFENesin-codeine 100-10 MG/5ML syrup Take 5 mLs by mouth at bedtime as needed for cough. 120 mL 0   No current facility-administered medications for this visit.       Objective:    BP 127/78 mmHg  Pulse 68  Temp(Src) 97 F (36.1 C) (Oral)  Ht 5\' 6"  (1.676 m)  Wt 168 lb (76.204 kg)  BMI 27.13 kg/m2  Wt Readings from Last 3 Encounters:  02/08/15 168 lb (  76.204 kg)  01/31/15 160 lb (72.576 kg)  01/18/15 167 lb (75.751 kg)     Gen: NAD, alert, cooperative with exam, NCAT EYES: EOMI, no scleral injection or icterus ENT:  TMs pearly gray b/l, OP without erythema LYMPH: no cervical LAD CV: NRRR, normal S1/S2, no murmur, distal pulses 2+ b/l Resp: CTABL, no wheezes, normal WOB Abd: +BS, soft, NTND. no guarding or organomegaly Ext: No edema, warm Neuro: Alert and oriented, strength equal b/l UE and LE, coordination grossly normal MSK: normal muscle bulk     Assessment & Plan:    Lisa Parrish was seen today for cough, nasal congestion and headache.  Diagnoses and all orders for this visit:  Acute sinusitis, recurrence not  specified, unspecified location Discussed symptomatic care, start below abx. Cough syrup as below. Take only at night, do not drive. -     azithromycin (ZITHROMAX) 250 MG tablet; Take 2 the first day and then one each day after.  Cough -     guaiFENesin-codeine 100-10 MG/5ML syrup; Take 5 mLs by mouth at bedtime as needed for cough.    Follow up plan: Return if symptoms worsen or fail to improve.  Assunta Found, MD [facility] 02/08/2015, 4:30 PM

## 2015-02-12 ENCOUNTER — Other Ambulatory Visit: Payer: Self-pay | Admitting: *Deleted

## 2015-02-12 DIAGNOSIS — K219 Gastro-esophageal reflux disease without esophagitis: Secondary | ICD-10-CM

## 2015-02-13 ENCOUNTER — Other Ambulatory Visit: Payer: Self-pay | Admitting: Cardiology

## 2015-02-13 DIAGNOSIS — K219 Gastro-esophageal reflux disease without esophagitis: Secondary | ICD-10-CM

## 2015-02-13 MED ORDER — PANTOPRAZOLE SODIUM 40 MG PO TBEC: 40.0000 mg | DELAYED_RELEASE_TABLET | Freq: Two times a day (BID) | ORAL | Status: DC

## 2015-02-14 ENCOUNTER — Other Ambulatory Visit: Payer: Self-pay | Admitting: Cardiology

## 2015-02-14 DIAGNOSIS — K219 Gastro-esophageal reflux disease without esophagitis: Secondary | ICD-10-CM

## 2015-02-14 MED ORDER — PANTOPRAZOLE SODIUM 40 MG PO TBEC
40.0000 mg | DELAYED_RELEASE_TABLET | Freq: Two times a day (BID) | ORAL | Status: DC
Start: 2015-02-14 — End: 2015-02-27

## 2015-02-14 NOTE — Telephone Encounter (Signed)
[facility] said they did not receive the refill for protonix.  Resent electronically.

## 2015-02-14 NOTE — Telephone Encounter (Signed)
[facility]said [facility] not get the generic Protonix that was sent yesterday. Please resend.

## 2015-02-15 ENCOUNTER — Telehealth: Payer: Self-pay | Admitting: Pediatrics

## 2015-02-15 DIAGNOSIS — J019 Acute sinusitis, unspecified: Secondary | ICD-10-CM

## 2015-02-15 MED ORDER — AMOXICILLIN-POT CLAVULANATE 875-125 MG PO TABS: 1.0000 | ORAL_TABLET | Freq: Two times a day (BID) | ORAL | Status: DC

## 2015-02-15 NOTE — Telephone Encounter (Signed)
If with no improvement in nasal congestion should take the augmentin. I pended it but havent sent it in yet. If congestion is improved but still coughing, likely post-viral, will go away but will take time. Keep water with her, drink fluids, use cough drops.

## 2015-02-15 NOTE — Telephone Encounter (Signed)
Returned patient's phone call.  Patient states that she is still having nasal and chest congestion and still has a cough.  Augmentin sent to pharmacy per Dr. Evette Doffing.  Patient aware.

## 2015-02-27 ENCOUNTER — Encounter: Payer: Self-pay | Admitting: Nurse Practitioner

## 2015-02-27 ENCOUNTER — Ambulatory Visit (INDEPENDENT_AMBULATORY_CARE_PROVIDER_SITE_OTHER): Payer: BLUE CROSS/BLUE SHIELD | Admitting: Nurse Practitioner

## 2015-02-27 VITALS — BP 132/85 | HR 61 | Temp 97.1°F | Ht 66.0 in | Wt 169.0 lb

## 2015-02-27 DIAGNOSIS — F32A Depression, unspecified: Secondary | ICD-10-CM | POA: Insufficient documentation

## 2015-02-27 DIAGNOSIS — I10 Essential (primary) hypertension: Secondary | ICD-10-CM | POA: Diagnosis not present

## 2015-02-27 DIAGNOSIS — F329 Major depressive disorder, single episode, unspecified: Secondary | ICD-10-CM | POA: Diagnosis not present

## 2015-02-27 DIAGNOSIS — K219 Gastro-esophageal reflux disease without esophagitis: Secondary | ICD-10-CM | POA: Diagnosis not present

## 2015-02-27 DIAGNOSIS — F411 Generalized anxiety disorder: Secondary | ICD-10-CM

## 2015-02-27 DIAGNOSIS — Z6827 Body mass index (BMI) 27.0-27.9, adult: Secondary | ICD-10-CM

## 2015-02-27 DIAGNOSIS — E785 Hyperlipidemia, unspecified: Secondary | ICD-10-CM

## 2015-02-27 LAB — COMPLETE METABOLIC PANEL WITH GFR
ALT: 12 U/L (ref 6–29)
AST: 15 U/L (ref 10–35)
Albumin: 4.1 g/dL (ref 3.6–5.1)
Alkaline Phosphatase: 72 U/L (ref 33–130)
BUN: 12 mg/dL (ref 7–25)
CO2: 26 mmol/L (ref 20–31)
Calcium: 9.2 mg/dL (ref 8.6–10.4)
Chloride: 104 mmol/L (ref 98–110)
Creat: 0.69 mg/dL (ref 0.50–1.05)
GFR, Est African American: >89 mL/min (ref >=60)
GFR, Est Non African American: >89 mL/min (ref >=60)
Glucose, Bld: 100 mg/dL — ABNORMAL HIGH (ref 70–99)
Potassium: 4.1 mmol/L (ref 3.5–5.3)
Sodium: 138 mmol/L (ref 135–146)
Total Bilirubin: 0.3 mg/dL (ref 0.2–1.2)
Total Protein: 6.6 g/dL (ref 6.1–8.1)

## 2015-02-27 LAB — LIPID PANEL
Cholesterol: 158 mg/dL (ref 125–200)
HDL: 43 mg/dL — ABNORMAL LOW (ref >=46)
LDL Cholesterol: 65 mg/dL (ref <130)
Total CHOL/HDL Ratio: 3.7 Ratio (ref <=5.0)
Triglycerides: 250 mg/dL — ABNORMAL HIGH (ref <150)
VLDL: 50 mg/dL — ABNORMAL HIGH (ref <30)

## 2015-02-27 MED ORDER — PHENTERMINE HCL 37.5 MG PO TABS: 37.5000 mg | ORAL_TABLET | Freq: Every day | ORAL | Status: DC

## 2015-02-27 MED ORDER — PRAVASTATIN SODIUM 10 MG PO TABS: 10.0000 mg | ORAL_TABLET | Freq: Every day | ORAL | Status: DC

## 2015-02-27 MED ORDER — METOPROLOL SUCCINATE ER 50 MG PO TB24: ORAL_TABLET | ORAL | Status: DC

## 2015-02-27 MED ORDER — PANTOPRAZOLE SODIUM 40 MG PO TBEC: 40.0000 mg | DELAYED_RELEASE_TABLET | Freq: Two times a day (BID) | ORAL | Status: DC

## 2015-02-27 MED ORDER — BUPROPION HCL ER (XL) 300 MG PO TB24: 300.0000 mg | ORAL_TABLET | Freq: Every day | ORAL | Status: DC

## 2015-02-27 NOTE — Patient Instructions (Signed)

## 2015-02-27 NOTE — Addendum Note (Signed)
Addended by: Earlene Plater on: 02/27/2015 03:01 PM   Modules accepted: Orders

## 2015-02-27 NOTE — Progress Notes (Signed)
Subjective:    Patient ID: Lisa Parrish, female    DOB: May 11, 1960, 54 y.o.   MRN: [id number]   Patient here today for follow up of chronic medical problems.  C/O cough that she has had for 3 weeks- no fever- dry hacky cough.  Hypertension This is a chronic problem. The current episode started more than 1 year ago. The problem has been waxing and waning since onset. The problem is controlled. Risk factors for coronary artery disease include dyslipidemia, obesity and post-menopausal state. Past treatments include calcium channel blockers. The current treatment provides moderate improvement. Compliance problems include diet and exercise.  There is no history of CAD/MI.  Hyperlipidemia This is a chronic problem. The current episode started more than 1 year ago. The problem is uncontrolled. Recent lipid tests were reviewed and are high. Current antihyperlipidemic treatment includes statins. The current treatment provides moderate improvement of lipids. Compliance problems include adherence to diet and adherence to exercise.  Risk factors for coronary artery disease include dyslipidemia, hypertension, obesity and post-menopausal.  GAD/depression wellbutrin working well. No complaint of side effects GERD protonix works well to keep symptoms under control.   Review of Systems  Constitutional: Negative.   HENT: Negative.   Cardiovascular: Negative.   Genitourinary: Negative.   Neurological: Negative.   Psychiatric/Behavioral: Negative.   All other systems reviewed and are negative.      Objective:   Physical Exam  Constitutional: She is oriented to person, place, and time. She appears well-developed and well-nourished.  HENT:  Nose: Nose normal.  Mouth/Throat: Oropharynx is clear and moist.  Eyes: EOM are normal.  Neck: Trachea normal, normal range of motion and full passive range of motion without pain. Neck supple. No JVD present. Carotid bruit is not present. No thyromegaly present.    Cardiovascular: Normal rate, regular rhythm, normal heart sounds and intact distal pulses.  Exam reveals no gallop and no friction rub.   No murmur heard. Pulmonary/Chest: Effort normal and breath sounds normal.  Abdominal: Soft. Bowel sounds are normal. She exhibits no distension and no mass. There is no tenderness.  Musculoskeletal: Normal range of motion.  Lymphadenopathy:    She has no cervical adenopathy.  Neurological: She is alert and oriented to person, place, and time. She has normal reflexes.  Skin: Skin is warm and dry.  Psychiatric: She has a normal mood and affect. Her behavior is normal. Judgment and thought content normal.    BP 132/85 mmHg  Pulse 61  Temp(Src) 97.1 F (36.2 C) (Oral)  Ht '5\' 6"'  (1.676 m)  Wt 169 lb (76.658 kg)  BMI 27.29 kg/m2       Assessment & Plan:  1. Essential hypertension Do not add salt to diet - metoprolol succinate (TOPROL-XL) 50 MG 24 hr tablet; TAKE 1 TABLET (50 MG TOTAL) BY MOUTH DAILY. TAKE WITH OR IMMEDIATELY FOLLOWING A MEAL.  Dispense: 90 tablet; Refill: 1 - CMP14+EGFR  2. Anxiety state Stress management  3. Hyperlipidemia Low fat diet - pravastatin (PRAVACHOL) 10 MG tablet; Take 1 tablet (10 mg total) by mouth at bedtime.  Dispense: 90 tablet; Refill: 4 - Lipid panel  4. Gastroesophageal reflux disease without esophagitis Avoid spicy foods Do not eat 2 hours prior to bedtime - pantoprazole (PROTONIX) 40 MG tablet; Take 1 tablet (40 mg total) by mouth 2 (two) times daily before a meal.  Dispense: 60 tablet; Refill: 5  5. BMI 27.0-27.9,adult Discussed diet and exercise for person with BMI >25 Will recheck weight in  3-6 months  6. Depression - buPROPion (WELLBUTRIN XL) 300 MG 24 hr tablet; Take 1 tablet (300 mg total) by mouth daily.  Dispense: 90 tablet; Refill: 0   Encouraged to do hemoccult cards that were given  To her at previous appointment Labs pending Health maintenance reviewed Diet and exercise  encouraged Continue all meds Follow up  In 6 months   [place], FNP

## 2015-02-27 NOTE — Addendum Note (Signed)
Addended by: Earlene Plater on: 02/27/2015 03:06 PM   Modules accepted: Orders

## 2015-02-27 NOTE — Addendum Note (Signed)
Addended by: Chevis Pretty on: 02/27/2015 02:56 PM   Modules accepted: Orders

## 2015-03-19 ENCOUNTER — Ambulatory Visit (INDEPENDENT_AMBULATORY_CARE_PROVIDER_SITE_OTHER): Payer: BLUE CROSS/BLUE SHIELD

## 2015-03-19 ENCOUNTER — Encounter: Payer: Self-pay | Admitting: Family Medicine

## 2015-03-19 ENCOUNTER — Ambulatory Visit (INDEPENDENT_AMBULATORY_CARE_PROVIDER_SITE_OTHER): Payer: BLUE CROSS/BLUE SHIELD | Admitting: Family Medicine

## 2015-03-19 VITALS — BP 143/89 | HR 69 | Temp 97.9°F | Ht 66.0 in | Wt 169.6 lb

## 2015-03-19 DIAGNOSIS — J40 Bronchitis, not specified as acute or chronic: Secondary | ICD-10-CM

## 2015-03-19 DIAGNOSIS — J329 Chronic sinusitis, unspecified: Secondary | ICD-10-CM

## 2015-03-19 LAB — POCT UA - MICROSCOPIC ONLY
Casts, Ur, LPF, POC: NEGATIVE
Crystals, Ur, HPF, POC: NEGATIVE
Mucus, UA: NEGATIVE
Yeast, UA: NEGATIVE

## 2015-03-19 LAB — POCT URINALYSIS DIPSTICK
Bilirubin, UA: NEGATIVE
Glucose, UA: NEGATIVE
Ketones, UA: NEGATIVE
Nitrite, UA: NEGATIVE
Protein, UA: NEGATIVE
Spec Grav, UA: <=1.005
Urobilinogen, UA: NEGATIVE
pH, UA: 5

## 2015-03-19 MED ORDER — BETAMETHASONE SOD PHOS & ACET 6 (3-3) MG/ML IJ SUSP
6.0000 mg | Freq: Once | INTRAMUSCULAR | Status: AC
  Administered 2015-03-19: 6 mg via INTRAMUSCULAR

## 2015-03-19 MED ORDER — LEVOFLOXACIN 500 MG PO TABS: 500.0000 mg | ORAL_TABLET | Freq: Every day | ORAL | Status: DC

## 2015-03-19 NOTE — Progress Notes (Signed)
Subjective:  Patient ID: Lisa Parrish, female    DOB: 18-May-1960  Age: 55 y.o. MRN: [id number]  CC: Cough and Headache   HPI Lisa Parrish presents for Patient presents with upper respiratory congestion. Rhinorrhea that is frequently purulent. There is moderate sore throat. Patient reports coughing frequently as well.-colored/purulent sputum noted. There is no fever no chills no sweats. The patient denies being short of breath. Onset was 3-5 days ago. Gradually worsening in spite of home remedies. Using allegra flonase and nyquil. Recently had zpack  History Loucille has a past medical history of Osteoarthritis; Hyperlipidemia; Hypertension; Headache, migraine; Anxiety; Seasonal allergies; Depression; Kidney stones; Pulmonary HTN ([place]) (12/21/2014); and S/P cardiac cath 12/20/14 normal coronary arteries (12/21/2014).   She has past surgical history that includes Hemorrhoid surgery (1989); Tubal ligation (1990); lumbar disckectomy (2006); and Cardiac catheterization (N/A, 12/20/2014).   Her family history includes Cancer in her mother; Colon cancer (age of onset: 92) in her paternal aunt and paternal uncle; Heart disease in her father.She reports that she quit smoking about 23 years ago. Her smoking use included Cigarettes. She has a 10 pack-year smoking history. She has never used smokeless tobacco. She reports that she does not drink alcohol or use illicit drugs.  Outpatient Prescriptions Prior to Visit  Medication Sig Dispense Refill  . azelastine (OPTIVAR) 0.05 % ophthalmic solution Place 1 drop into both eyes daily as needed (for allergy eye). 6 mL 12  . buPROPion (WELLBUTRIN XL) 300 MG 24 hr tablet Take 1 tablet (300 mg total) by mouth daily. 90 tablet 0  . lisinopril (PRINIVIL,ZESTRIL) 10 MG tablet Take 1 tablet (10 mg total) by mouth daily. 90 tablet 3  . metoprolol succinate (TOPROL-XL) 50 MG 24 hr tablet TAKE 1 TABLET (50 MG TOTAL) BY MOUTH DAILY. TAKE WITH OR IMMEDIATELY FOLLOWING A  MEAL. 90 tablet 1  . pantoprazole (PROTONIX) 40 MG tablet Take 1 tablet (40 mg total) by mouth 2 (two) times daily before a meal. 60 tablet 5  . pravastatin (PRAVACHOL) 10 MG tablet Take 1 tablet (10 mg total) by mouth at bedtime. 90 tablet 4  . acetaminophen (TYLENOL) 325 MG tablet Take 2 tablets (650 mg total) by mouth every 4 (four) hours as needed for headache or mild pain. (Patient not taking: Reported on 03/19/2015)    . Multiple Vitamins-Minerals (HAIR/SKIN/NAILS PO) Take 1 tablet by mouth daily. Reported on 03/19/2015    . phentermine (ADIPEX-P) 37.5 MG tablet Take 1 tablet (37.5 mg total) by mouth daily before breakfast. (Patient not taking: Reported on 03/19/2015) 30 tablet 2  . guaiFENesin-codeine 100-10 MG/5ML syrup Take 5 mLs by mouth at bedtime as needed for cough. 120 mL 0   No facility-administered medications prior to visit.    ROS Review of Systems  Constitutional: Negative for fever, chills, activity change and appetite change.  HENT: Positive for congestion, postnasal drip, rhinorrhea and sinus pressure. Negative for ear discharge, ear pain, hearing loss, nosebleeds, sneezing and trouble swallowing.   Respiratory: Negative for chest tightness and shortness of breath.   Cardiovascular: Negative for chest pain and palpitations.  Skin: Negative for rash.  Neurological: Positive for headaches.    Objective:  BP 143/89 mmHg  Pulse 69  Temp(Src) 97.9 F (36.6 C) (Oral)  Ht 5' 6" (1.676 m)  Wt 169 lb 9.6 oz (76.93 kg)  BMI 27.39 kg/m2  BP Readings from Last 3 Encounters:  03/19/15 143/89  02/27/15 132/85  02/08/15 127/78    Wt Readings from Last  3 Encounters:  03/19/15 169 lb 9.6 oz (76.93 kg)  02/27/15 169 lb (76.658 kg)  02/08/15 168 lb (76.204 kg)     Physical Exam  Constitutional: She appears well-developed and well-nourished.  HENT:  Head: Normocephalic and atraumatic.  Right Ear: Tympanic membrane and external ear normal. No decreased hearing is noted.    Left Ear: Tympanic membrane and external ear normal. No decreased hearing is noted.  Nose: Mucosal edema present. Right sinus exhibits no frontal sinus tenderness. Left sinus exhibits no frontal sinus tenderness.  Mouth/Throat: No oropharyngeal exudate or posterior oropharyngeal erythema.  Neck: No Brudzinski's sign noted.  Pulmonary/Chest: Breath sounds normal. No respiratory distress.  Lymphadenopathy:       Head (right side): No preauricular adenopathy present.       Head (left side): No preauricular adenopathy present.       Right cervical: No superficial cervical adenopathy present.      Left cervical: No superficial cervical adenopathy present.     Lab Results  Component Value Date   WBC 4.9 03/19/2015   HGB 12.4 03/19/2015   HCT 37.2 03/19/2015   PLT 234 03/19/2015   GLUCOSE 82 03/19/2015   CHOL 158 02/27/2015   TRIG 250* 02/27/2015   HDL 43* 02/27/2015   LDLDIRECT 124 01/18/2015   LDLCALC 65 02/27/2015   ALT 12 03/19/2015   AST 17 03/19/2015   NA 140 03/19/2015   K 4.2 03/19/2015   CL 101 03/19/2015   CREATININE 0.74 03/19/2015   BUN 13 03/19/2015   CO2 28 03/19/2015   TSH 1.398 12/19/2014   INR 1.03 12/19/2014    Dg Chest Portable 1 View  12/19/2014  CLINICAL DATA:  Central chest pain for 2 hours. EXAM: PORTABLE CHEST 1 VIEW COMPARISON:  02/24/2013 FINDINGS: Lung volumes are low. Cardiomediastinal contours are unchanged allowing for differences in technique. No pulmonary edema, confluent airspace disease, pleural effusion or pneumothorax. No acute osseous abnormalities are seen. IMPRESSION: Hypoventilatory chest without acute process. Electronically Signed   By: Jeb Levering M.D.   On: 12/19/2014 20:04    Assessment & Plan:   Lasheika was seen today for cough and headache.  Diagnoses and all orders for this visit:  Sinobronchitis -     betamethasone acetate-betamethasone sodium phosphate (CELESTONE) injection 6 mg; Inject 1 mL (6 mg total) into the muscle  once. -     Cancel: CMP14+EGFR -     Cancel: CBC with Differential/Platelet -     POCT urinalysis dipstick -     DG Chest 2 View; Future -     Comprehensive metabolic panel -     CBC -     POCT UA - Microscopic Only  Other orders -     levofloxacin (LEVAQUIN) 500 MG tablet; Take 1 tablet (500 mg total) by mouth daily.  I have discontinued [name]'s guaiFENesin-codeine. I am also having her start on levofloxacin. Additionally, I am having her maintain her Multiple Vitamins-Minerals (HAIR/SKIN/NAILS PO), acetaminophen, azelastine, lisinopril, buPROPion, pantoprazole, pravastatin, metoprolol succinate, and phentermine. We administered betamethasone acetate-betamethasone sodium phosphate.  Meds ordered this encounter  Medications  . betamethasone acetate-betamethasone sodium phosphate (CELESTONE) injection 6 mg    Sig:   . levofloxacin (LEVAQUIN) 500 MG tablet    Sig: Take 1 tablet (500 mg total) by mouth daily.    Dispense:  10 tablet    Refill:  0     Follow-up: No Follow-up on file.  Claretta Fraise, M.D.

## 2015-03-20 LAB — CBC
HCT: 37.2 % (ref 36.0–46.0)
Hemoglobin: 12.4 g/dL (ref 12.0–15.0)
MCH: 28.9 pg (ref 26.0–34.0)
MCHC: 33.3 g/dL (ref 30.0–36.0)
MCV: 86.7 fL (ref 78.0–100.0)
MPV: 11.9 fL (ref 8.6–12.4)
Platelets: 234 10*3/uL (ref 150–400)
RBC: 4.29 MIL/uL (ref 3.87–5.11)
RDW: 13.9 % (ref 11.5–15.5)
WBC: 4.9 10*3/uL (ref 4.0–10.5)

## 2015-03-20 LAB — COMPREHENSIVE METABOLIC PANEL
ALT: 12 U/L (ref 6–29)
AST: 17 U/L (ref 10–35)
Albumin: 4.5 g/dL (ref 3.6–5.1)
Alkaline Phosphatase: 80 U/L (ref 33–130)
BUN: 13 mg/dL (ref 7–25)
CO2: 28 mmol/L (ref 20–31)
Calcium: 9.2 mg/dL (ref 8.6–10.4)
Chloride: 101 mmol/L (ref 98–110)
Creat: 0.74 mg/dL (ref 0.50–1.05)
Glucose, Bld: 82 mg/dL (ref 70–99)
Potassium: 4.2 mmol/L (ref 3.5–5.3)
Sodium: 140 mmol/L (ref 135–146)
Total Bilirubin: 0.4 mg/dL (ref 0.2–1.2)
Total Protein: 6.8 g/dL (ref 6.1–8.1)

## 2015-03-21 ENCOUNTER — Encounter (HOSPITAL_BASED_OUTPATIENT_CLINIC_OR_DEPARTMENT_OTHER): Payer: BLUE CROSS/BLUE SHIELD

## 2015-03-23 ENCOUNTER — Telehealth: Payer: Self-pay | Admitting: *Deleted

## 2015-03-23 NOTE — Telephone Encounter (Signed)
-----   Message from Claretta Fraise, MD sent at 03/21/2015  3:08 PM EST ----- Tonny Bollman,    Your lab result is normal.Some minor variations that are not significant are commonly marked abnormal, but do not represent any medical problem for you.  Best regards, Claretta Fraise, M.D.

## 2015-03-23 NOTE — Telephone Encounter (Signed)
Pt notified of results Verbalizes understanding 

## 2015-04-16 ENCOUNTER — Telehealth: Payer: Self-pay | Admitting: Nurse Practitioner

## 2015-04-16 NOTE — Telephone Encounter (Signed)
WILL CALL BACK TO SCHEDULE °

## 2015-04-25 ENCOUNTER — Other Ambulatory Visit: Payer: Self-pay | Admitting: Pediatrics

## 2015-06-18 ENCOUNTER — Other Ambulatory Visit: Payer: Self-pay | Admitting: Cardiovascular Disease

## 2015-09-14 ENCOUNTER — Encounter: Payer: Self-pay | Admitting: Family Medicine

## 2015-09-14 ENCOUNTER — Ambulatory Visit (INDEPENDENT_AMBULATORY_CARE_PROVIDER_SITE_OTHER): Payer: BLUE CROSS/BLUE SHIELD

## 2015-09-14 ENCOUNTER — Other Ambulatory Visit: Payer: Self-pay | Admitting: Family Medicine

## 2015-09-14 ENCOUNTER — Ambulatory Visit (INDEPENDENT_AMBULATORY_CARE_PROVIDER_SITE_OTHER): Payer: BLUE CROSS/BLUE SHIELD | Admitting: Family Medicine

## 2015-09-14 ENCOUNTER — Other Ambulatory Visit: Payer: Self-pay | Admitting: Nurse Practitioner

## 2015-09-14 VITALS — BP 113/87 | HR 66 | Temp 97.3°F | Ht 66.0 in | Wt 168.2 lb

## 2015-09-14 DIAGNOSIS — R05 Cough: Secondary | ICD-10-CM

## 2015-09-14 DIAGNOSIS — M25559 Pain in unspecified hip: Secondary | ICD-10-CM

## 2015-09-14 DIAGNOSIS — I10 Essential (primary) hypertension: Secondary | ICD-10-CM

## 2015-09-14 DIAGNOSIS — R059 Cough, unspecified: Secondary | ICD-10-CM

## 2015-09-14 DIAGNOSIS — M5416 Radiculopathy, lumbar region: Secondary | ICD-10-CM | POA: Diagnosis not present

## 2015-09-14 DIAGNOSIS — G8929 Other chronic pain: Secondary | ICD-10-CM

## 2015-09-14 MED ORDER — VALSARTAN 160 MG PO TABS: 160.0000 mg | ORAL_TABLET | Freq: Every day | ORAL | Status: DC

## 2015-09-14 NOTE — Progress Notes (Signed)
Subjective:  Patient ID: Lisa Parrish, female    DOB: 1960-12-29  Age: 55 y.o. MRN: [id number]  CC: Back Pain and Cough   HPI Lisa Parrish presents for six months of increasing cough, all day long. Nagging. Nonproductive. No dyspnea. Equal day and night. Quit smoking over 30 years ago.Concerned thatit may be from medication. Also worried that her brother recently died of throat cancer.  Back pain is at midlumbar region. It radiates to the left leg behind the knee. It is daily. It is 5/10 with flares that go higher. Not sure of triggers. Previous back surgery. Dr. Carloyn Manner not planning further. Just wants her to maintain pain meds. Currently pt. Managing on cyclobenzaprine at night. Makes her too drowsy to use through the day.    History Lisa Parrish has a past medical history of Osteoarthritis; Hyperlipidemia; Hypertension; Headache, migraine; Anxiety; Seasonal allergies; Depression; Kidney stones; Pulmonary HTN ([place]) (12/21/2014); and S/P cardiac cath 12/20/14 normal coronary arteries (12/21/2014).   She has past surgical history that includes Hemorrhoid surgery (1989); Tubal ligation (1990); lumbar disckectomy (2006); and Cardiac catheterization (N/A, 12/20/2014).   Her family history includes Cancer in her mother; Colon cancer (age of onset: 24) in her paternal aunt and paternal uncle; Heart disease in her father.She reports that she quit smoking about 24 years ago. Her smoking use included Cigarettes. She has a 10 pack-year smoking history. She has never used smokeless tobacco. She reports that she does not drink alcohol or use illicit drugs.    ROS Review of Systems  Constitutional: Negative for fever, activity change and appetite change.  HENT: Negative for congestion, rhinorrhea and sore throat.   Eyes: Negative for visual disturbance.  Respiratory: Positive for cough. Negative for choking and shortness of breath.   Cardiovascular: Negative for chest pain and palpitations.  Gastrointestinal:  Negative for nausea, abdominal pain and diarrhea.  Genitourinary: Negative for dysuria.  Musculoskeletal: Negative for myalgias and arthralgias.    Objective:  BP 113/87 mmHg  Pulse 66  Temp(Src) 97.3 F (36.3 C) (Oral)  Ht 5\' 6"  (1.676 m)  Wt 168 lb 3.2 oz (76.295 kg)  BMI 27.16 kg/m2  SpO2 97%  BP Readings from Last 3 Encounters:  09/14/15 113/87  03/19/15 143/89  02/27/15 132/85    Wt Readings from Last 3 Encounters:  09/14/15 168 lb 3.2 oz (76.295 kg)  03/19/15 169 lb 9.6 oz (76.93 kg)  02/27/15 169 lb (76.658 kg)     Physical Exam  Constitutional: She is oriented to person, place, and time. She appears well-developed and well-nourished. No distress.  HENT:  Head: Normocephalic and atraumatic.  Eyes: Conjunctivae are normal. Pupils are equal, round, and reactive to light.  Neck: Normal range of motion. Neck supple. No thyromegaly present.  Cardiovascular: Normal rate, regular rhythm and normal heart sounds.   No murmur heard. Pulmonary/Chest: Effort normal and breath sounds normal. No respiratory distress. She has no wheezes. She has no rales.  Abdominal: Soft. Bowel sounds are normal. She exhibits no distension. There is no tenderness.  Musculoskeletal: Normal range of motion. She exhibits tenderness (at lumbar midline and over greater trachaanter bilaterally).  Lymphadenopathy:    She has no cervical adenopathy.  Neurological: She is alert and oriented to person, place, and time.  Skin: Skin is warm and dry.  Psychiatric: She has a normal mood and affect. Her behavior is normal. Judgment and thought content normal.     Lab Results  Component Value Date   WBC 4.9 03/19/2015  HGB 12.4 03/19/2015   HCT 37.2 03/19/2015   PLT 234 03/19/2015   GLUCOSE 82 03/19/2015   CHOL 158 02/27/2015   TRIG 250* 02/27/2015   HDL 43* 02/27/2015   LDLDIRECT 124 01/18/2015   LDLCALC 65 02/27/2015   ALT 12 03/19/2015   AST 17 03/19/2015   NA 140 03/19/2015   K 4.2  03/19/2015   CL 101 03/19/2015   CREATININE 0.74 03/19/2015   BUN 13 03/19/2015   CO2 28 03/19/2015   TSH 1.398 12/19/2014   INR 1.03 12/19/2014       Assessment & Plan:   Lisa Parrish was seen today for back pain and cough.  Diagnoses and all orders for this visit:  Cough -     PR BREATHING CAPACITY TEST -     DG Chest 2 View; Future -     Ambulatory referral to ENT  Essential hypertension  Chronic pain  Lumbar radiculopathy -     Ambulatory referral to Neurosurgery  Hip pain, acute, unspecified laterality -     DG HIP UNILAT W OR W/O PELVIS 2-3 VIEWS LEFT; Future -     DG HIP UNILAT W OR W/O PELVIS 2-3 VIEWS RIGHT; Future  Other orders -     valsartan (DIOVAN) 160 MG tablet; Take 1 tablet (160 mg total) by mouth daily. For blood pressure      I have discontinued Lisa Parrish's lisinopril and levofloxacin. I am also having her start on valsartan. Additionally, I am having her maintain her Multiple Vitamins-Minerals (HAIR/SKIN/NAILS PO), acetaminophen, azelastine, buPROPion, pantoprazole, pravastatin, metoprolol succinate, and phentermine.  Meds ordered this encounter  Medications  . valsartan (DIOVAN) 160 MG tablet    Sig: Take 1 tablet (160 mg total) by mouth daily. For blood pressure    Dispense:  30 tablet    Refill:  5     Follow-up: Return in about 6 months (around 03/16/2016) for hypertension.  Claretta Fraise, M.D.

## 2015-09-14 NOTE — Telephone Encounter (Signed)
Please review and advise.

## 2015-09-14 NOTE — Telephone Encounter (Signed)
Last seen 7/14.17  Dr Livia Snellen  If approved route to nurse to call into [facility]

## 2015-09-17 NOTE — Progress Notes (Signed)
Quick Note:  Your chest x-ray looked normal. Thanks, WS. ______ 

## 2015-09-20 NOTE — Telephone Encounter (Signed)
Rx for Phentermine called into [facility] Okayed per Dr Livia Snellen

## 2015-10-17 ENCOUNTER — Other Ambulatory Visit: Payer: Self-pay

## 2015-10-17 DIAGNOSIS — M5416 Radiculopathy, lumbar region: Secondary | ICD-10-CM

## 2015-10-22 ENCOUNTER — Other Ambulatory Visit: Payer: Self-pay | Admitting: Nurse Practitioner

## 2015-10-22 DIAGNOSIS — F32A Depression, unspecified: Secondary | ICD-10-CM

## 2015-10-22 DIAGNOSIS — I10 Essential (primary) hypertension: Secondary | ICD-10-CM

## 2015-10-22 DIAGNOSIS — F329 Major depressive disorder, single episode, unspecified: Secondary | ICD-10-CM

## 2015-10-26 ENCOUNTER — Other Ambulatory Visit: Payer: Self-pay | Admitting: Cardiology

## 2015-10-26 ENCOUNTER — Other Ambulatory Visit: Payer: Self-pay | Admitting: Nurse Practitioner

## 2015-10-26 DIAGNOSIS — F329 Major depressive disorder, single episode, unspecified: Secondary | ICD-10-CM

## 2015-10-26 DIAGNOSIS — I10 Essential (primary) hypertension: Secondary | ICD-10-CM

## 2015-10-26 DIAGNOSIS — F32A Depression, unspecified: Secondary | ICD-10-CM

## 2015-10-26 DIAGNOSIS — E785 Hyperlipidemia, unspecified: Secondary | ICD-10-CM

## 2015-11-02 ENCOUNTER — Ambulatory Visit (HOSPITAL_COMMUNITY): Admission: RE | Admit: 2015-11-02 | Payer: BLUE CROSS/BLUE SHIELD | Source: Ambulatory Visit

## 2015-11-15 ENCOUNTER — Ambulatory Visit (HOSPITAL_COMMUNITY): Payer: BLUE CROSS/BLUE SHIELD

## 2016-01-03 ENCOUNTER — Telehealth: Payer: Self-pay | Admitting: Nurse Practitioner

## 2016-01-03 ENCOUNTER — Other Ambulatory Visit: Payer: Self-pay | Admitting: Family Medicine

## 2016-01-03 MED ORDER — CLONAZEPAM 1 MG PO TABS: ORAL_TABLET | ORAL | 0 refills | Status: DC

## 2016-01-03 NOTE — Telephone Encounter (Signed)
I sent in the requested prescription 

## 2016-01-03 NOTE — Telephone Encounter (Signed)
Patient aware that medication has been sent in.  °

## 2016-01-07 ENCOUNTER — Other Ambulatory Visit: Payer: Self-pay | Admitting: Nurse Practitioner

## 2016-01-07 NOTE — Telephone Encounter (Signed)
Contacted [facility] and called in Klonopin 1 mg, #1, with no refills from Dr. Livia Snellen.  Contacted patient to let them know prescription has been called in, left that message on voicemail.

## 2016-01-17 ENCOUNTER — Telehealth: Payer: Self-pay | Admitting: Nurse Practitioner

## 2016-01-17 NOTE — Telephone Encounter (Signed)
Please address  No referral for Neurologist

## 2016-01-17 NOTE — Telephone Encounter (Signed)
Left message to get more information as to the reason she wanted referral to nephrologist

## 2016-01-17 NOTE — Telephone Encounter (Signed)
Why does she need to see nephrolohist- her last kidney function was normal-who told her she needed to see nephrologist.

## 2016-01-21 ENCOUNTER — Telehealth: Payer: Self-pay | Admitting: Nurse Practitioner

## 2016-01-21 ENCOUNTER — Other Ambulatory Visit: Payer: Self-pay | Admitting: Nurse Practitioner

## 2016-01-21 DIAGNOSIS — G8929 Other chronic pain: Secondary | ICD-10-CM

## 2016-01-21 DIAGNOSIS — F32A Depression, unspecified: Secondary | ICD-10-CM

## 2016-01-21 DIAGNOSIS — F329 Major depressive disorder, single episode, unspecified: Secondary | ICD-10-CM

## 2016-01-21 NOTE — Telephone Encounter (Signed)
Left message to return call.  Patient has no showed for two appointments to have MRI done.  No appointment can be done with Dr. Carloyn Manner, neurosurgeon, unless MRI has been completed.

## 2016-01-21 NOTE — Telephone Encounter (Signed)
Please review MRI results under imaging and advise, Thanks

## 2016-01-22 NOTE — Addendum Note (Signed)
Addended by: Michaela Corner on: 01/22/2016 10:36 AM   Modules accepted: Orders

## 2016-01-29 ENCOUNTER — Encounter (HOSPITAL_COMMUNITY): Payer: Self-pay | Admitting: *Deleted

## 2016-01-29 ENCOUNTER — Emergency Department (HOSPITAL_COMMUNITY)
Admission: EM | Admit: 2016-01-29 | Discharge: 2016-01-29 | Disposition: A | Discharge status: Home / Self Care | Payer: BLUE CROSS/BLUE SHIELD | Attending: Emergency Medicine | Admitting: Emergency Medicine

## 2016-01-29 ENCOUNTER — Telehealth: Payer: Self-pay

## 2016-01-29 DIAGNOSIS — Z87891 Personal history of nicotine dependence: Secondary | ICD-10-CM | POA: Diagnosis not present

## 2016-01-29 DIAGNOSIS — M545 Low back pain, unspecified: Secondary | ICD-10-CM

## 2016-01-29 DIAGNOSIS — I10 Essential (primary) hypertension: Secondary | ICD-10-CM | POA: Diagnosis not present

## 2016-01-29 MED ORDER — HYDROCODONE-ACETAMINOPHEN 5-325 MG PO TABS: 2.0000 | ORAL_TABLET | ORAL | 0 refills | Status: DC | PRN

## 2016-01-29 MED ORDER — PREDNISONE 10 MG (21) PO TBPK: ORAL_TABLET | ORAL | 0 refills | Status: DC

## 2016-01-29 NOTE — ED Triage Notes (Signed)
Pt reports lower back pain that increased yesterday. Hx of same and recently had MRI done for it and waiting on referral appt. Ambulatory at triage with no acute distress noted.

## 2016-01-29 NOTE — ED Provider Notes (Signed)
[facility] DEPT Provider Note   CSN: [id number] Arrival date & time: 01/29/16  1208  By signing my name below, I, [name], attest that this documentation has been prepared under the direction and in the presence of Caryl Ada, [place]. Electronically Signed: [name], ED Scribe. 01/29/16. 12:46 PM.   History   Chief Complaint Chief Complaint  Patient presents with  . Back Pain    HPI HPI Comments: Lisa Parrish is a 55 y.o. female with a h/o osteoarthritis and L5/S1 discectomy who presents to the Emergency Department complaining of constant, moderate, atraumatic, lower back pain x 1 day. She denies any recent injuries or heavy lifting but does report a h/o lumbar pain. She states her current pain is a "pinching sensation" which radiates to her left buttock and down her LLE to her left calf. She states her current back back is atypical. Pt had an MRI performed a few weeks ago showing a synovial cyst and lumbar spinal stenosis in the L4/L5/S1 region. Her MRI was ordered by her PCP and she has not had a f/u since the imaging was performed. She took 1800 mg ibuprofen this morning w/o significant relief. Her pain worsens when sitting or standing for extended periods of time. No h/o DM. Her discectomy was performed in 2007 by Dr. Glenna Fellows (neurology). She denies bowel or bladder incontinence, saddle anesthesia or other associated symptoms at this time.   The history is provided by the patient. No language interpreter was used.    Past Medical History:  Diagnosis Date  . Anxiety   . Depression   . Headache, migraine   . Hyperlipidemia   . Hypertension   . Kidney stones   . Osteoarthritis    back  . Pulmonary HTN 12/21/2014  . S/P cardiac cath 12/20/14 normal coronary arteries 12/21/2014  . Seasonal allergies     Patient Active Problem List   Diagnosis Date Noted  . BMI 27.0-27.9,adult 02/27/2015  . Gastroesophageal reflux disease without esophagitis 02/27/2015  .  Depression 02/27/2015  . Snoring 02/01/2015  . Pulmonary HTN 12/21/2014  . Sleep apnea in adult 12/21/2014  . Hyperlipidemia 12/19/2014  . Chronic pain 06/12/2013  . HTN (hypertension) 08/24/2012  . Migraines 08/24/2012  . Anxiety state 08/24/2012    Past Surgical History:  Procedure Laterality Date  . CARDIAC CATHETERIZATION N/A 12/20/2014   Procedure: Left Heart Cath and Coronary Angiography;  Surgeon: Sherren Mocha, MD;  Location: [facility];  Service: Cardiovascular;  Laterality: N/A;  . [place]  . lumbar disckectomy  2006  . TUBAL LIGATION  1990    OB History    No data available       Home Medications    Prior to Admission medications   Medication Sig Start Date End Date Taking? Authorizing Provider  acetaminophen (TYLENOL) 325 MG tablet Take 2 tablets (650 mg total) by mouth every 4 (four) hours as needed for headache or mild pain. 12/21/14   Isaiah Serge, NP  azelastine (OPTIVAR) 0.05 % ophthalmic solution Place 1 drop into both eyes daily as needed (for allergy eye). 12/21/14   Isaiah Serge, NP  buPROPion (WELLBUTRIN XL) 300 MG 24 hr tablet Take 1 tablet (300 mg total) by mouth daily. 02/27/15   Mary-Margaret Hassell Done, FNP  buPROPion (WELLBUTRIN XL) 300 MG 24 hr tablet TAKE 1 TABLET EVERY DAY 01/22/16   Timmothy Euler, MD  clonazePAM (KLONOPIN) 1 MG tablet Take 1 tablet 30 minutes to an  hour before the procedure. 01/03/16   Claretta Fraise, MD  metoprolol succinate (TOPROL-XL) 50 MG 24 hr tablet TAKE 1 TABLET (50 MG TOTAL) BY MOUTH DAILY. TAKE WITH OR IMMEDIATELY FOLLOWING A MEAL. 10/22/15   Mary-Margaret Hassell Done, FNP  metoprolol succinate (TOPROL-XL) 50 MG 24 hr tablet TAKE 1 TABLET (50 MG TOTAL) BY MOUTH DAILY. TAKE WITH OR IMMEDIATELY FOLLOWING A MEAL. 10/29/15   Claretta Fraise, MD  Multiple Vitamins-Minerals (HAIR/SKIN/NAILS PO) Take 1 tablet by mouth daily. Reported on 03/19/2015    Historical Provider, MD  pantoprazole (PROTONIX) 40 MG  tablet Take 1 tablet (40 mg total) by mouth 2 (two) times daily before a meal. 02/27/15   Mary-Margaret Hassell Done, FNP  phentermine (ADIPEX-P) 37.5 MG tablet TAKE ONE TABLET BY MOUTH ONCE DAILY BEFORE BREAKFAST 09/17/15   Claretta Fraise, MD  pravastatin (PRAVACHOL) 10 MG tablet Take 1 tablet (10 mg total) by mouth at bedtime. 02/27/15   Mary-Margaret Hassell Done, FNP  valsartan (DIOVAN) 160 MG tablet Take 1 tablet (160 mg total) by mouth daily. For blood pressure 09/14/15   Claretta Fraise, MD    Family History Family History  Problem Relation Age of Onset  . Cancer Mother   . Heart disease Father   . Colon cancer Paternal Aunt 54    father's half sister  . Colon cancer Paternal Uncle 53    father's half brother    Social History Social History  Substance Use Topics  . Smoking status: Former Smoker    Packs/day: 1.00    Years: 10.00    Types: Cigarettes    Quit date: 09/18/1991  . Smokeless tobacco: Never Used  . Alcohol use No   Allergies   Morphine and related   Review of Systems Review of Systems  Musculoskeletal: Positive for back pain and myalgias.  Skin: Negative for color change and wound.  Neurological: Negative for weakness and numbness.  All other systems reviewed and are negative.  Physical Exam Updated Vital Signs BP 166/96 (BP Location: Right Arm)   Pulse 73   Temp 97.9 F (36.6 C) (Oral)   Resp 18   Ht 5\' 6"  (1.676 m)   Wt 165 lb (74.8 kg)   SpO2 97%   BMI 26.63 kg/m   Physical Exam  Constitutional: She is oriented to person, place, and time. She appears well-developed and well-nourished.  HENT:  Head: Normocephalic and atraumatic.  Eyes: EOM are normal.  Neck: Normal range of motion.  Cardiovascular: Normal rate.   Pulmonary/Chest: Effort normal. No respiratory distress.  Abdominal: Soft.  Musculoskeletal: Normal range of motion.  Neurological: She is alert and oriented to person, place, and time.  Skin: Skin is warm and dry.  Psychiatric: She has a  normal mood and affect.  Nursing note and vitals reviewed.  ED Treatments / Results  Labs (all labs ordered are listed, but only abnormal results are displayed) Labs Reviewed - No data to display  EKG  EKG Interpretation None       Radiology No results found.  Procedures Procedures  DIAGNOSTIC STUDIES: Oxygen Saturation is 97% on RA, normal by my interpretation.    COORDINATION OF CARE: 12:45 PM Discussed next steps with pt. Pt verbalized understanding and is agreeable with the plan.    Medications Ordered in ED Medications - No data to display   Initial Impression / Assessment and Plan / ED Course  I have reviewed the triage vital signs and the nursing notes.  Pertinent labs & imaging results that were  available during my care of the patient were reviewed by me and considered in my medical decision making (see chart for details).  Clinical Course     Patient with back pain.  No neurological deficits and normal neuro exam.  Patient is ambulatory.  No loss of bowel or bladder control.  No concern for cauda equina.  No fever, night sweats, weight loss, h/o cancer, IVDA, no recent procedure to back. No urinary symptoms suggestive of UTI.  Supportive care and return precaution discussed. Appears safe for discharge at this time. Follow up as indicated in discharge paperwork.    Final Clinical Impressions(s) / ED Diagnoses   Final diagnoses:  Acute bilateral low back pain without sciatica    New Prescriptions Discharge Medication List as of 01/29/2016  1:21 PM    START taking these medications   Details  HYDROcodone-acetaminophen (NORCO/VICODIN) 5-325 MG tablet Take 2 tablets by mouth every 4 (four) hours as needed., Starting Tue 01/29/2016, Print    predniSONE (STERAPRED UNI-PAK 21 TAB) 10 MG (21) TBPK tablet 6,5,4,3,2,1 taper, Print      An After Visit Summary was printed and given to the patient.   Fransico Meadow, PA-C 01/29/16 1411  I personally performed  the services in this documentation, which was scribed in my presence.  The recorded information has been reviewed and considered.   Ronnald Collum.   Hollace Kinnier Celeste, PA-C 01/29/16 [place], MD 01/30/16 [phone]

## 2016-02-04 ENCOUNTER — Ambulatory Visit (INDEPENDENT_AMBULATORY_CARE_PROVIDER_SITE_OTHER): Payer: BLUE CROSS/BLUE SHIELD | Admitting: Family Medicine

## 2016-02-04 ENCOUNTER — Encounter: Payer: Self-pay | Admitting: Family Medicine

## 2016-02-04 VITALS — BP 137/86 | HR 62 | Temp 97.5°F | Ht 66.0 in | Wt 178.0 lb

## 2016-02-04 DIAGNOSIS — M7138 Other bursal cyst, other site: Secondary | ICD-10-CM | POA: Insufficient documentation

## 2016-02-04 DIAGNOSIS — M5416 Radiculopathy, lumbar region: Secondary | ICD-10-CM | POA: Insufficient documentation

## 2016-02-04 DIAGNOSIS — M549 Dorsalgia, unspecified: Secondary | ICD-10-CM | POA: Insufficient documentation

## 2016-02-04 MED ORDER — HYDROCODONE-ACETAMINOPHEN 7.5-325 MG PO TABS: 1.0000 | ORAL_TABLET | Freq: Four times a day (QID) | ORAL | 0 refills | Status: DC | PRN

## 2016-02-04 NOTE — Progress Notes (Signed)
Subjective:  Patient ID: Lisa Parrish, female    DOB: 02-Dec-1960  Age: 55 y.o. MRN: [id number]  times per shift CC: Medication Refill (refills, needs back pain meds for synovial cyst, FMLA papers filed out, does not have appt w/ surgeon yet)   HPI Lisa Parrish presents for back pain. 8-9/10 Midline low back with radiation into left buttock, thigh and calf. Worse with lifting and twisting motion. Pain seems to worsen within three hours of starting shift. Pain becomes intolerable. She needs FMLA to limit her work. She has had an MRI showing L5 nerve root compression. 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 Bends and places a part 900 timmes per shift. History Lisa Parrish has a past medical history of Anxiety; Depression; Headache, migraine; Hyperlipidemia; Hypertension; Kidney stones; Osteoarthritis; Pulmonary HTN (12/21/2014); S/P cardiac cath 12/20/14 normal coronary arteries (12/21/2014); and Seasonal allergies.   She has a past surgical history that includes Hemorrhoid surgery (1989); Tubal ligation (1990); lumbar disckectomy (2006); and Cardiac catheterization (N/A, 12/20/2014).   Her family history includes Cancer in her mother; Colon cancer (age of onset: 28) in her paternal aunt and paternal uncle; Heart disease in her father.She reports that she quit smoking about 24 years ago. Her smoking use included Cigarettes. She has a 10.00 pack-year smoking history. She has never used smokeless tobacco. She reports that she does not drink alcohol or use drugs.    ROS Review of Systems  Constitutional: Negative for activity change, appetite change and fever.  HENT: Negative for congestion, rhinorrhea and sore throat.   Eyes: Negative for visual disturbance.  Respiratory: Negative for cough and shortness of breath.   Cardiovascular: Negative for chest pain and palpitations.  Gastrointestinal: Negative for abdominal pain, diarrhea and nausea.  Genitourinary: Negative for dysuria.    Musculoskeletal: Positive for arthralgias, back pain, joint swelling and myalgias.    Objective:  BP 137/86   Pulse 62   Temp 97.5 F (36.4 C) (Oral)   Ht 5\' 6"  (1.676 m)   Wt 178 lb (80.7 kg)   BMI 28.73 kg/m   BP Readings from Last 3 Encounters:  02/04/16 137/86  01/29/16 166/96  09/14/15 113/87    Wt Readings from Last 3 Encounters:  02/04/16 178 lb (80.7 kg)  01/29/16 165 lb (74.8 kg)  09/14/15 168 lb 3.2 oz (76.3 kg)     Physical Exam  Constitutional: She is oriented to person, place, and time. She appears well-developed and well-nourished. No distress.  HENT:  Head: Normocephalic and atraumatic.  Eyes: Conjunctivae and EOM are normal. Pupils are equal, round, and reactive to light.  Neck: Normal range of motion. Neck supple. No thyromegaly present.  Cardiovascular: Normal rate, regular rhythm and normal heart sounds.   No murmur heard. Pulmonary/Chest: Effort normal and breath sounds normal. No respiratory distress. She has no wheezes. She has no rales.  Abdominal: Soft. Bowel sounds are normal. She exhibits no distension. There is no tenderness.  Musculoskeletal: She exhibits tenderness. She exhibits no edema.       Lumbar back: She exhibits decreased range of motion, tenderness and spasm. She exhibits no deformity and normal pulse.  Lymphadenopathy:    She has no cervical adenopathy.  Neurological: She is alert and oriented to person, place, and time. She has normal reflexes.  Skin: Skin is warm and dry.  Psychiatric: She has a normal mood and affect. Her behavior is normal. Judgment and thought content normal.     Lab Results  Component Value Date   WBC 4.9 03/19/2015   HGB 12.4 03/19/2015   HCT 37.2 03/19/2015   PLT 234 03/19/2015   GLUCOSE 82 03/19/2015   CHOL 158 02/27/2015   TRIG 250 (H) 02/27/2015   HDL 43 (L) 02/27/2015   LDLDIRECT 124 01/18/2015   LDLCALC 65 02/27/2015   ALT 12 03/19/2015   AST 17 03/19/2015   NA 140 03/19/2015   K 4.2  03/19/2015   CL 101 03/19/2015   CREATININE 0.74 03/19/2015   BUN 13 03/19/2015   CO2 28 03/19/2015   TSH 1.398 12/19/2014   INR 1.03 12/19/2014      Assessment & Plan:   Lisa Parrish was seen today for medication refill.  Diagnoses and all orders for this visit:  Lumbar radiculopathy  Other orders -     HYDROcodone-acetaminophen (NORCO) 7.5-325 MG tablet; Take 1 tablet by mouth every 6 (six) hours as needed for moderate pain.    MRI report reviewed showing L4-5 disc dehydration with dyspnea and narrowing. 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 L5-S1 shows facet arthrosis with mild disc space narrowing and disc hydration moderate left foraminal stenosis.  I have discontinued Lisa Parrish's acetaminophen, phentermine, predniSONE, and HYDROcodone-acetaminophen. I am also having her start on HYDROcodone-acetaminophen. Additionally, I am having her maintain her Multiple Vitamins-Minerals (HAIR/SKIN/NAILS PO), azelastine, pantoprazole, pravastatin, valsartan, metoprolol succinate, clonazePAM, buPROPion, and ibuprofen.  Meds ordered this encounter  Medications  . ibuprofen (ADVIL,MOTRIN) 200 MG tablet    Sig: Take 400 mg by mouth 2 (two) times daily.  Marland Kitchen HYDROcodone-acetaminophen (NORCO) 7.5-325 MG tablet    Sig: Take 1 tablet by mouth every 6 (six) hours as needed for moderate pain.    Dispense:  30 tablet    Refill:  0   We are awaiting referrals both for pain and to her neurosurgeon, Dr. Carloyn Manner.  Follow-up: Return in about 2 weeks (around 02/18/2016).  Claretta Fraise, M.D.

## 2016-02-07 ENCOUNTER — Telehealth: Payer: Self-pay | Admitting: Nurse Practitioner

## 2016-02-08 NOTE — Telephone Encounter (Signed)
noted 

## 2016-02-11 ENCOUNTER — Telehealth: Payer: Self-pay | Admitting: Nurse Practitioner

## 2016-02-11 DIAGNOSIS — Z0289 Encounter for other administrative examinations: Secondary | ICD-10-CM

## 2016-02-11 NOTE — Telephone Encounter (Signed)
x

## 2016-02-12 NOTE — Telephone Encounter (Signed)
noted 

## 2016-02-18 ENCOUNTER — Encounter: Payer: Self-pay | Admitting: Family Medicine

## 2016-02-18 ENCOUNTER — Other Ambulatory Visit: Payer: Self-pay | Admitting: Family Medicine

## 2016-02-18 ENCOUNTER — Ambulatory Visit (INDEPENDENT_AMBULATORY_CARE_PROVIDER_SITE_OTHER): Payer: BLUE CROSS/BLUE SHIELD | Admitting: Family Medicine

## 2016-02-18 ENCOUNTER — Ambulatory Visit (INDEPENDENT_AMBULATORY_CARE_PROVIDER_SITE_OTHER): Payer: BLUE CROSS/BLUE SHIELD

## 2016-02-18 VITALS — BP 153/97 | HR 72 | Temp 97.7°F | Ht 66.0 in | Wt 180.0 lb

## 2016-02-18 DIAGNOSIS — J209 Acute bronchitis, unspecified: Secondary | ICD-10-CM | POA: Diagnosis not present

## 2016-02-18 DIAGNOSIS — R05 Cough: Secondary | ICD-10-CM

## 2016-02-18 DIAGNOSIS — F32A Depression, unspecified: Secondary | ICD-10-CM

## 2016-02-18 DIAGNOSIS — F329 Major depressive disorder, single episode, unspecified: Secondary | ICD-10-CM

## 2016-02-18 DIAGNOSIS — R059 Cough, unspecified: Secondary | ICD-10-CM

## 2016-02-18 MED ORDER — AZITHROMYCIN 250 MG PO TABS: ORAL_TABLET | ORAL | 0 refills | Status: DC

## 2016-02-18 MED ORDER — BENZONATATE 200 MG PO CAPS: 200.0000 mg | ORAL_CAPSULE | Freq: Three times a day (TID) | ORAL | 0 refills | Status: DC | PRN

## 2016-02-18 NOTE — Progress Notes (Signed)
BP (!) 153/96   Pulse 74   Temp 97.7 F (36.5 C) (Oral)   Ht 5\' 6"  (1.676 m)   Wt 180 lb (81.6 kg)   BMI 29.05 kg/m    Subjective:    Patient ID: Lisa Parrish, female    DOB: 1960/10/27, 55 y.o.   MRN: [id number]  HPI: Lisa Parrish is a 55 y.o. female presenting on 02/18/2016 for Cough (chest congestion, head congestion, ST,  some fever this monring, Advil cold & sinus, nyquil, worse today, started Saturday) and Diarrhea   HPI Cough and chest congestion and sinus congestion Patient has been having cough and chest congestion and sinus congestion with been increasing over the past 3 days. Today she was a lot worse than she had been. She has been using Advil sinus and cold and NyQuil and vitamin C and it is still been getting worse despite those. She denies any sick contacts that she knows of. She denies any shortness of breath or wheezing. She does have a history of smoking but not a current smoker.  Relevant past medical, surgical, family and social history reviewed and updated as indicated. Interim medical history since our last visit reviewed. Allergies and medications reviewed and updated.  Review of Systems  Constitutional: Negative for chills and fever.  HENT: Positive for congestion, postnasal drip, rhinorrhea, sinus pressure and sore throat. Negative for ear discharge, ear pain and sneezing.   Eyes: Negative for pain, redness and visual disturbance.  Respiratory: Positive for cough. Negative for chest tightness, shortness of breath and wheezing.   Cardiovascular: Negative for chest pain and leg swelling.  Genitourinary: Negative for difficulty urinating and dysuria.  Musculoskeletal: Negative for back pain and gait problem.  Skin: Negative for rash.  Neurological: Negative for light-headedness and headaches.  Psychiatric/Behavioral: Negative for agitation and behavioral problems.  All other systems reviewed and are negative.   Per HPI unless specifically indicated  above      Objective:    BP (!) 153/96   Pulse 74   Temp 97.7 F (36.5 C) (Oral)   Ht 5\' 6"  (1.676 m)   Wt 180 lb (81.6 kg)   BMI 29.05 kg/m   Wt Readings from Last 3 Encounters:  02/18/16 180 lb (81.6 kg)  02/04/16 178 lb (80.7 kg)  01/29/16 165 lb (74.8 kg)    Physical Exam  Constitutional: She is oriented to person, place, and time. She appears well-developed and well-nourished. No distress.  HENT:  Right Ear: Tympanic membrane, external ear and ear canal normal.  Left Ear: Tympanic membrane, external ear and ear canal normal.  Nose: Mucosal edema and rhinorrhea present. No epistaxis. Right sinus exhibits no maxillary sinus tenderness and no frontal sinus tenderness. Left sinus exhibits no maxillary sinus tenderness and no frontal sinus tenderness.  Mouth/Throat: Uvula is midline and mucous membranes are normal. Posterior oropharyngeal edema and posterior oropharyngeal erythema present. No oropharyngeal exudate or tonsillar abscesses.  Eyes: Conjunctivae are normal.  Cardiovascular: Normal rate, regular rhythm, normal heart sounds and intact distal pulses.   No murmur heard. Pulmonary/Chest: Effort normal and breath sounds normal. No respiratory distress. She has no wheezes. She has no rales.  Musculoskeletal: Normal range of motion. She exhibits no edema or tenderness.  Neurological: She is alert and oriented to person, place, and time. Coordination normal.  Skin: Skin is warm and dry. No rash noted. She is not diaphoretic.  Psychiatric: She has a normal mood and affect. Her behavior is normal.  Vitals reviewed.  Assessment & Plan:   Problem List Items Addressed This Visit    None    Visit Diagnoses    Acute bronchitis, unspecified organism    -  Primary   Relevant Medications   azithromycin (ZITHROMAX) 250 MG tablet   benzonatate (TESSALON) 200 MG capsule   Other Relevant Orders   DG Chest 2 View       Follow up plan: Return if symptoms worsen or fail to  improve.  Counseling provided for all of the vaccine components Orders Placed This Encounter  Procedures  . DG Chest 2 View    Caryl Pina, MD [facility] 02/18/2016, 6:01 PM

## 2016-02-20 ENCOUNTER — Telehealth: Payer: Self-pay | Admitting: Nurse Practitioner

## 2016-02-20 NOTE — Telephone Encounter (Signed)
Patient aware of xray results.   

## 2016-03-03 HISTORY — PX: LUMBAR FUSION: SHX111

## 2016-03-14 ENCOUNTER — Other Ambulatory Visit: Payer: Self-pay | Admitting: *Deleted

## 2016-03-14 ENCOUNTER — Telehealth: Payer: Self-pay | Admitting: Nurse Practitioner

## 2016-03-14 DIAGNOSIS — F32A Depression, unspecified: Secondary | ICD-10-CM

## 2016-03-14 DIAGNOSIS — F329 Major depressive disorder, single episode, unspecified: Secondary | ICD-10-CM

## 2016-03-14 MED ORDER — BUPROPION HCL ER (XL) 300 MG PO TB24: 300.0000 mg | ORAL_TABLET | Freq: Every day | ORAL | 0 refills | Status: DC

## 2016-03-18 ENCOUNTER — Encounter: Payer: Self-pay | Admitting: Family Medicine

## 2016-03-18 ENCOUNTER — Ambulatory Visit (INDEPENDENT_AMBULATORY_CARE_PROVIDER_SITE_OTHER): Payer: BLUE CROSS/BLUE SHIELD | Admitting: Family Medicine

## 2016-03-18 ENCOUNTER — Telehealth: Payer: Self-pay | Admitting: Family Medicine

## 2016-03-18 VITALS — BP 135/85 | HR 94 | Ht 66.0 in | Wt 180.0 lb

## 2016-03-18 DIAGNOSIS — M5416 Radiculopathy, lumbar region: Secondary | ICD-10-CM | POA: Diagnosis not present

## 2016-03-18 MED ORDER — DULOXETINE HCL 30 MG PO CPEP: 30.0000 mg | ORAL_CAPSULE | Freq: Every day | ORAL | 0 refills | Status: DC

## 2016-03-18 MED ORDER — TIZANIDINE HCL 4 MG PO TABS: 4.0000 mg | ORAL_TABLET | Freq: Four times a day (QID) | ORAL | 0 refills | Status: DC | PRN

## 2016-03-18 MED ORDER — PREDNISONE 10 MG PO TABS: ORAL_TABLET | ORAL | 0 refills | Status: DC

## 2016-03-18 NOTE — Telephone Encounter (Signed)
Spoke with Dr. Livia Snellen and he is sending in Cymbalta. Pt to take one capsule daily for the first week and then 2 after that and continue taking the Wellbutrin, pt advised and voiced understanding.

## 2016-03-18 NOTE — Progress Notes (Signed)
Subjective:  Patient ID: Lisa Parrish, female    DOB: 1960/09/30  Age: 56 y.o. MRN: [id number]  CC: Back Pain (pt here today c/o back pain, she hasn't been able to get in to see the surgeon and she is still in pain, wants a refill on pain medication)   HPI Lisa Parrish presents for Patient states she's having 9/10 back pain at the lower lumbar region bilaterally. She has been referred to Dr. Carloyn Manner that there've been some delays due to needing the disc of her latest MRI. That MRI shows L4-5 disc dehydration and disc space narrowing with a right-sided synovial cyst that compresses the right L5 nerve root. She says she walked that to him herself yesterday. Unfortunately her back pain was so severe yesterday she had to leave work. She responded well last month to a Sterapred Dosepak. And would like to have a repeat of that. Pain is worsening. It is worse when she stays in any position more than a few minutes. It's not truly relieved by laying down and standing up sitting etc. But some improvement with position change. She cannot do her regular job due to pain when it flares like this.   History Lisa Parrish has a past medical history of Anxiety; Depression; Headache, migraine; Hyperlipidemia; Hypertension; Kidney stones; Osteoarthritis; Pulmonary HTN (12/21/2014); S/P cardiac cath 12/20/14 normal coronary arteries (12/21/2014); and Seasonal allergies.   She has a past surgical history that includes Hemorrhoid surgery (1989); Tubal ligation (1990); lumbar disckectomy (2006); and Cardiac catheterization (N/A, 12/20/2014).   Her family history includes Cancer in her mother; Colon cancer (age of onset: 69) in her paternal aunt and paternal uncle; Heart disease in her father.She reports that she quit smoking about 24 years ago. Her smoking use included Cigarettes. She has a 10.00 pack-year smoking history. She has never used smokeless tobacco. She reports that she does not drink alcohol or use  drugs.    ROS Review of Systems  Constitutional: Negative for activity change, appetite change and fever.  HENT: Negative for congestion, rhinorrhea and sore throat.   Eyes: Negative for visual disturbance.  Respiratory: Negative for cough and shortness of breath.   Cardiovascular: Negative for chest pain and palpitations.  Gastrointestinal: Negative for abdominal pain, diarrhea and nausea.  Genitourinary: Negative for dysuria.  Musculoskeletal: Positive for arthralgias, back pain and myalgias.    Objective:  BP 135/85   Pulse 94   Ht 5\' 6"  (1.676 m)   Wt 180 lb (81.6 kg)   BMI 29.05 kg/m   BP Readings from Last 3 Encounters:  03/18/16 135/85  02/18/16 (!) 153/97  02/04/16 137/86    Wt Readings from Last 3 Encounters:  03/18/16 180 lb (81.6 kg)  02/18/16 180 lb (81.6 kg)  02/04/16 178 lb (80.7 kg)     Physical Exam  Constitutional: She is oriented to person, place, and time. She appears well-developed and well-nourished. No distress.  HENT:  Head: Normocephalic and atraumatic.  Eyes: Conjunctivae and EOM are normal. Pupils are equal, round, and reactive to light.  Neck: Normal range of motion. Neck supple.  Cardiovascular: Normal rate, regular rhythm and normal heart sounds.   No murmur heard. Pulmonary/Chest: Effort normal and breath sounds normal. No respiratory distress. She has no wheezes. She has no rales.  Abdominal: Soft. Bowel sounds are normal. She exhibits no distension. There is no tenderness.  Musculoskeletal: She exhibits tenderness (Lumbosacral, junction bilaterally.). She exhibits no edema.       Lumbar back: She exhibits  decreased range of motion, tenderness and spasm. She exhibits no deformity and normal pulse.  Neurological: She is alert and oriented to person, place, and time. She has normal reflexes.  Skin: Skin is warm and dry.  Psychiatric: She has a normal mood and affect. Her behavior is normal. Thought content normal.    No results  found.  Assessment & Plan:   Lisa Parrish was seen today for back pain.  Diagnoses and all orders for this visit:  Lumbar radiculopathy  Other orders -     predniSONE (DELTASONE) 10 MG tablet; Take 5 daily for 2 days followed by 4,3,2 and 1 for 2 days each. -     tiZANidine (ZANAFLEX) 4 MG tablet; Take 1 tablet (4 mg total) by mouth every 6 (six) hours as needed for muscle spasms. -     DULoxetine (CYMBALTA) 30 MG capsule; Take 1 capsule (30 mg total) by mouth daily. For one week then two daily. Take with a full stomach at suppertime      I have discontinued [name]'s ibuprofen, azithromycin, and benzonatate. I am also having her start on predniSONE, tiZANidine, and DULoxetine. Additionally, I am having her maintain her Multiple Vitamins-Minerals (HAIR/SKIN/NAILS PO), azelastine, pantoprazole, pravastatin, valsartan, metoprolol succinate, HYDROcodone-acetaminophen, and buPROPion.  Allergies as of 03/18/2016      Reactions   Morphine And Related Nausea And Vomiting      Medication List       Accurate as of 03/18/16  5:44 PM. Always use your most recent med list.          azelastine 0.05 % ophthalmic solution Commonly known as:  OPTIVAR Place 1 drop into both eyes daily as needed (for allergy eye).   buPROPion 300 MG 24 hr tablet Commonly known as:  WELLBUTRIN XL Take 1 tablet (300 mg total) by mouth daily.   DULoxetine 30 MG capsule Commonly known as:  CYMBALTA Take 1 capsule (30 mg total) by mouth daily. For one week then two daily. Take with a full stomach at suppertime   HAIR/SKIN/NAILS PO Take 1 tablet by mouth daily. Reported on 03/19/2015   HYDROcodone-acetaminophen 7.5-325 MG tablet Commonly known as:  NORCO Take 1 tablet by mouth every 6 (six) hours as needed for moderate pain.   metoprolol succinate 50 MG 24 hr tablet Commonly known as:  TOPROL-XL TAKE 1 TABLET (50 MG TOTAL) BY MOUTH DAILY. TAKE WITH OR IMMEDIATELY FOLLOWING A MEAL.   pantoprazole 40 MG  tablet Commonly known as:  PROTONIX Take 1 tablet (40 mg total) by mouth 2 (two) times daily before a meal.   pravastatin 10 MG tablet Commonly known as:  PRAVACHOL Take 1 tablet (10 mg total) by mouth at bedtime.   predniSONE 10 MG tablet Commonly known as:  DELTASONE Take 5 daily for 2 days followed by 4,3,2 and 1 for 2 days each.   tiZANidine 4 MG tablet Commonly known as:  ZANAFLEX Take 1 tablet (4 mg total) by mouth every 6 (six) hours as needed for muscle spasms.   valsartan 160 MG tablet Commonly known as:  DIOVAN Take 1 tablet (160 mg total) by mouth daily. For blood pressure      It is my hope that the synovial cyst can be removed surgically reducing the compression of the L5 nerve root and therefore minimizing pain.  Follow-up: Return if symptoms worsen or fail to improve.  Claretta Fraise, M.D.

## 2016-03-20 ENCOUNTER — Encounter: Payer: Self-pay | Admitting: Family Medicine

## 2016-04-22 ENCOUNTER — Telehealth: Payer: Self-pay | Admitting: Family Medicine

## 2016-04-22 ENCOUNTER — Ambulatory Visit: Payer: BLUE CROSS/BLUE SHIELD | Admitting: Physical Therapy

## 2016-04-22 NOTE — Telephone Encounter (Signed)
Patient saw doctor today for back pain.  While she was there blood pressure was elevated.  164/100 when she first arrived and 140/97 before leaving.  Patient said she has not been checking her BP at home but has felt that it may have been elevated.  Has been having headaches recently.  She is taking Metoprolol XL 50 mg daily.  Would like to know what you recommend.  Please advise.

## 2016-04-22 NOTE — Telephone Encounter (Signed)
Appointment made on 04/24/16 at 3:25 pm with Dr. Livia Snellen

## 2016-04-22 NOTE — Telephone Encounter (Signed)
Please contact the patient to follow up here.

## 2016-04-24 ENCOUNTER — Encounter: Payer: Self-pay | Admitting: Family Medicine

## 2016-04-24 ENCOUNTER — Ambulatory Visit (INDEPENDENT_AMBULATORY_CARE_PROVIDER_SITE_OTHER): Payer: BLUE CROSS/BLUE SHIELD | Admitting: Family Medicine

## 2016-04-24 VITALS — BP 125/76 | HR 70 | Temp 97.1°F | Ht 66.0 in | Wt 181.0 lb

## 2016-04-24 DIAGNOSIS — I10 Essential (primary) hypertension: Secondary | ICD-10-CM

## 2016-04-24 DIAGNOSIS — G8929 Other chronic pain: Secondary | ICD-10-CM | POA: Diagnosis not present

## 2016-04-24 NOTE — Progress Notes (Signed)
Subjective:  Patient ID: Lisa Parrish, female    DOB: [birth year]  Age: 56 y.o. MRN: [id number]  CC: Hypertension (pt here today stating that her BP was elevated at her appt when she saw pain management)   HPI Lisa Parrish presents for  follow-up of hypertension. Patient has no history of headache chest pain or shortness of breath or recent cough. Patient also denies symptoms of TIA such as numbness weakness lateralizing. Patient Was seen in pain clinic on 2/20 with increased pain due to failure of the medication she was taking to control her chronic pain. At that time her blood pressure was noted to be elevated to approximately 165/100. It came down to approximately 140/97 on the second check after resting for 15 minutes. On that day she was given a different pain medication that worked better. By the next day blood pressure was measured at home at 128/86. Earlier today at home it was 128/84. Patient is concerned that these elevated readings could lead to a stroke. She would like to know how to better control her blood pressure. Patient denies side effects from medication. States taking it regularly.   History Lisa Parrish has a past medical history of Anxiety; Depression; Headache, migraine; Hyperlipidemia; Hypertension; Kidney stones; Osteoarthritis; Pulmonary HTN (12/21/2014); S/P cardiac cath 12/20/14 normal coronary arteries (12/21/2014); and Seasonal allergies.   She has a past surgical history that includes Hemorrhoid surgery (1989); Tubal ligation (1990); lumbar disckectomy (2006); and Cardiac catheterization (N/A, 12/20/2014).   Her family history includes Cancer in her mother; Colon cancer (age of onset: 63) in her paternal aunt and paternal uncle; Heart disease in her father.She reports that she quit smoking about 24 years ago. Her smoking use included Cigarettes. She has a 10.00 pack-year smoking history. She has never used smokeless tobacco. She reports that she does not drink alcohol or use  drugs.  Current Outpatient Prescriptions on File Prior to Visit  Medication Sig Dispense Refill  . azelastine (OPTIVAR) 0.05 % ophthalmic solution Place 1 drop into both eyes daily as needed (for allergy eye). 6 mL 12  . buPROPion (WELLBUTRIN XL) 300 MG 24 hr tablet Take 1 tablet (300 mg total) by mouth daily. 90 tablet 0  . metoprolol succinate (TOPROL-XL) 50 MG 24 hr tablet TAKE 1 TABLET (50 MG TOTAL) BY MOUTH DAILY. TAKE WITH OR IMMEDIATELY FOLLOWING A MEAL. 90 tablet 1  . Multiple Vitamins-Minerals (HAIR/SKIN/NAILS PO) Take 1 tablet by mouth daily. Reported on 03/19/2015    . pantoprazole (PROTONIX) 40 MG tablet Take 1 tablet (40 mg total) by mouth 2 (two) times daily before a meal. 60 tablet 5  . tiZANidine (ZANAFLEX) 4 MG tablet Take 1 tablet (4 mg total) by mouth every 6 (six) hours as needed for muscle spasms. 30 tablet 0   No current facility-administered medications on file prior to visit.     ROS Review of Systems  Constitutional: Negative for activity change, appetite change and fever.  HENT: Negative for congestion, rhinorrhea and sore throat.   Eyes: Negative for visual disturbance.  Respiratory: Negative for cough and shortness of breath.   Cardiovascular: Negative for chest pain and palpitations.  Gastrointestinal: Negative for abdominal pain, diarrhea and nausea.  Genitourinary: Negative for dysuria.  Musculoskeletal: Negative for arthralgias and myalgias.    Objective:  BP 125/76   Pulse 70   Temp 97.1 F (36.2 C) (Oral)   Ht 5\' 6"  (1.676 m)   Wt 181 lb (82.1 kg)   BMI 29.21 kg/m  BP Readings from Last 3 Encounters:  04/24/16 125/76  03/18/16 135/85  02/18/16 (!) 153/97    Wt Readings from Last 3 Encounters:  04/24/16 181 lb (82.1 kg)  03/18/16 180 lb (81.6 kg)  02/18/16 180 lb (81.6 kg)     Physical Exam  Constitutional: She is oriented to person, place, and time. She appears well-developed and well-nourished. No distress.  HENT:  Head:  Normocephalic and atraumatic.  Eyes: Conjunctivae are normal. Pupils are equal, round, and reactive to light.  Neck: Normal range of motion. Neck supple. No thyromegaly present.  Cardiovascular: Normal rate, regular rhythm and normal heart sounds.   No murmur heard. Pulmonary/Chest: Effort normal and breath sounds normal. No respiratory distress. She has no wheezes. She has no rales.  Abdominal: Soft. Bowel sounds are normal.  Musculoskeletal: Normal range of motion.  Lymphadenopathy:    She has no cervical adenopathy.  Neurological: She is alert and oriented to person, place, and time.  Skin: Skin is warm and dry.  Psychiatric: She has a normal mood and affect. Her behavior is normal. Judgment and thought content normal.     Lab Results  Component Value Date   WBC 4.9 03/19/2015   HGB 12.4 03/19/2015   HCT 37.2 03/19/2015   PLT 234 03/19/2015   GLUCOSE 82 03/19/2015   CHOL 158 02/27/2015   TRIG 250 (H) 02/27/2015   HDL 43 (L) 02/27/2015   LDLDIRECT 124 01/18/2015   LDLCALC 65 02/27/2015   ALT 12 03/19/2015   AST 17 03/19/2015   NA 140 03/19/2015   K 4.2 03/19/2015   CL 101 03/19/2015   CREATININE 0.74 03/19/2015   BUN 13 03/19/2015   CO2 28 03/19/2015   TSH 1.398 12/19/2014   INR 1.03 12/19/2014    No results found.  Assessment & Plan:   Lisa Parrish was seen today for hypertension.  Diagnoses and all orders for this visit:  Accelerated hypertension  Other chronic pain   I have discontinued Lisa Parrish's pravastatin, valsartan, HYDROcodone-acetaminophen, predniSONE, and DULoxetine. I am also having her maintain her Multiple Vitamins-Minerals (HAIR/SKIN/NAILS PO), azelastine, pantoprazole, metoprolol succinate, buPROPion, tiZANidine, and oxyCODONE-acetaminophen.  Meds ordered this encounter  Medications  . oxyCODONE-acetaminophen (PERCOCET/ROXICET) 5-325 MG tablet    Sig: Take 1 tablet by mouth every 8 (eight) hours as needed for severe pain.  I explained to the  patient that these blood pressures were not in the severe range. And her risk from individual readings was minimal. However there is accumulative effect over the long term of elevated blood pressure which did need to be addressed. Since the blood pressure came down quite nicely after changing to a more effective pain regimen, I do not believe she needs an increase in medicine for now. Therefore she will check her blood pressure at home 3 times each morning and 3 times each evening for the next 3 days she is looking for a reading of 135/85 for the average of each of these sets of 3.   Follow-up: Return in about 3 months (around 07/22/2016), or if symptoms worsen or fail to improve.  Claretta Fraise, M.D.

## 2016-04-29 ENCOUNTER — Ambulatory Visit: Payer: BLUE CROSS/BLUE SHIELD | Attending: Anesthesiology | Admitting: Physical Therapy

## 2016-04-29 DIAGNOSIS — M545 Low back pain, unspecified: Secondary | ICD-10-CM

## 2016-04-29 DIAGNOSIS — G8929 Other chronic pain: Secondary | ICD-10-CM

## 2016-04-29 NOTE — Therapy (Signed)
[facility] [place], [place], [address] Phone: [phone]   Fax:  [phone]  Physical Therapy Evaluation  Patient Details  Name: Lisa Parrish MRN: [id number] Date of Birth: 05/08/1960 Referring Provider: Dorene Ar MD.  Encounter Date: 04/29/2016      PT End of Session - 04/29/16 1648    Visit Number 1   Number of Visits 12   Date for PT Re-Evaluation 06/10/16   PT Start Time 0200   PT Stop Time 0248   PT Time Calculation (min) 48 min   Activity Tolerance Patient tolerated treatment well   Behavior During Therapy [facility] for tasks assessed/performed      Past Medical History:  Diagnosis Date  . Anxiety   . Depression   . Headache, migraine   . Hyperlipidemia   . Hypertension   . Kidney stones   . Osteoarthritis    back  . Pulmonary HTN 12/21/2014  . S/P cardiac cath 12/20/14 normal coronary arteries 12/21/2014  . Seasonal allergies     Past Surgical History:  Procedure Laterality Date  . CARDIAC CATHETERIZATION N/A 12/20/2014   Procedure: Left Heart Cath and Coronary Angiography;  Surgeon: Sherren Mocha, MD;  Location: [place] [facility];  Service: Cardiovascular;  Laterality: N/A;  . [place]  . lumbar disckectomy  2006  . TUBAL LIGATION  1990    There were no vitals filed for this visit.       Subjective Assessment - 04/29/16 1730    Pertinent History Lumbar discectomy/laminectomy.   Pain Onset More than a month ago            [facility] PT Assessment - 04/29/16 0001      Assessment   Medical Diagnosis Lumbar DDD   Referring Provider Dorene Ar MD.   Onset Date/Surgical Date --  March 2017.     Precautions   Precautions None     Balance Screen   Has the patient fallen in the past 6 months No   Has the patient had a decrease in activity level because of a fear of falling?  No   Is the patient reluctant to leave their home because of a fear of falling?  No     Prior Function   Level of Independence Independent     Posture/Postural Control   Posture/Postural Control No significant limitations     ROM / Strength   AROM / PROM / Strength AROM;Strength     AROM   Overall AROM Comments Active lumbar extension= 20 degrees and flexion is WNL.     Strength   Overall Strength Comments Normal bilateral LE strength.     Palpation   Palpation comment Tender to palpation at level L3-4 QL region and left SIJ.     Special Tests    Special Tests Lumbar  (+)LT FABER test;(=)leg lengths;(-)SLR testing.   Lumbar Tests --  LT Ach DTR took multiple attempts to elicit at a 99991111 grade     Ambulation/Gait   Gait Comments The patient transitioned slowly from sit to stand and walk slow and purpsoefully in obvious pain.                   [facility] Adult PT Treatment/Exercise - 04/29/16 0001      Modalities   Modalities Electrical Stimulation;Moist Heat     Moist Heat Therapy   Number Minutes Moist Heat --  15   Moist Heat Location Lumbar Spine  Acupuncturist Location left low back.   Electrical Stimulation Action Pre-mod   Electrical Stimulation Parameters 80-150 Hz x 15 minutes.   Electrical Stimulation Goals Pain                     PT Long Term Goals - 04/29/16 1747      PT LONG TERM GOAL #1   Title Independent with a HEP.   Time 6   Period Weeks   Status New     PT LONG TERM GOAL #2   Title Sit 30 minutes with pain not > 3/10.   Time 6   Period Weeks   Status New     PT LONG TERM GOAL #3   Title Perform ADL's with pain not > 3/10.   Time 6   Period Weeks   Status New               Plan - 04/29/16 1739    Clinical Impression Statement The patient reports the onset of pain in March of 2017 upon waking.  Her pain averages a 7-8/10 on a daily basis in her left lower back region and aches and throbs.  Her strength and range of motion is essentially normal but her high  pain-level significantly limits her functional mobility and ability to perfrom ADL's.  patient will benefit from skilled PT to maximize function.   Rehab Potential Good   PT Frequency 2x / week   PT Duration 6 weeks   PT Treatment/Interventions ADLs/Self Care Home Management;Electrical Stimulation;Moist Heat;Ultrasound;Therapeutic activities;Therapeutic exercise;Patient/family education;Manual techniques;Dry needling   PT Next Visit Plan In prone left QL release and STW/M to SIJ region.  Dry needling.  U/S and core exercise progression.   Consulted and Agree with Plan of Care Patient      Patient will benefit from skilled therapeutic intervention in order to improve the following deficits and impairments:  Pain, Decreased activity tolerance  Visit Diagnosis: Chronic left-sided low back pain without sciatica - Plan: PT plan of care cert/re-cert     Problem List Patient Active Problem List   Diagnosis Date Noted  . Back pain 02/04/2016  . Lumbar radiculopathy 02/04/2016  . BMI 27.0-27.9,adult 02/27/2015  . Gastroesophageal reflux disease without esophagitis 02/27/2015  . Depression 02/27/2015  . Snoring 02/01/2015  . Pulmonary HTN 12/21/2014  . Sleep apnea in adult 12/21/2014  . Hyperlipidemia 12/19/2014  . Chronic pain 06/12/2013  . HTN (hypertension) 08/24/2012  . Migraines 08/24/2012  . Anxiety state 08/24/2012    Myrka Sylva, [place] MPT 04/29/2016, 5:50 PM  [facility] [address] [place], [place], [address] Phone: [phone]   Fax:  [phone]  Name: Aamna Kolar MRN: [id number] Date of Birth: May 06, 1960

## 2016-04-29 NOTE — Patient Instructions (Signed)
Educated patient in seated posture and use of a lumbar roll and sleeping with pillows between knees in a side lying position. Patient sleeps on her stomach.

## 2016-04-30 ENCOUNTER — Telehealth: Payer: Self-pay | Admitting: Family Medicine

## 2016-04-30 NOTE — Telephone Encounter (Signed)
Allergy noted in chart.

## 2016-05-08 ENCOUNTER — Ambulatory Visit: Payer: BLUE CROSS/BLUE SHIELD | Attending: Anesthesiology | Admitting: Physical Therapy

## 2016-05-08 DIAGNOSIS — M545 Low back pain, unspecified: Secondary | ICD-10-CM

## 2016-05-08 DIAGNOSIS — G8929 Other chronic pain: Secondary | ICD-10-CM | POA: Diagnosis present

## 2016-05-08 NOTE — Therapy (Signed)
[facility] [place], [place], [address] Phone: [phone]   Fax:  [phone]  Physical Therapy Treatment  Patient Details  Name: Lisa Parrish MRN: [id number] Date of Birth: 06-01-1960 Referring Provider: Dorene Ar MD.  Encounter Date: 05/08/2016    Past Medical History:  Diagnosis Date  . Anxiety   . Depression   . Headache, migraine   . Hyperlipidemia   . Hypertension   . Kidney stones   . Osteoarthritis    back  . Pulmonary HTN 12/21/2014  . S/P cardiac cath 12/20/14 normal coronary arteries 12/21/2014  . Seasonal allergies     Past Surgical History:  Procedure Laterality Date  . CARDIAC CATHETERIZATION N/A 12/20/2014   Procedure: Left Heart Cath and Coronary Angiography;  Surgeon: Sherren Mocha, MD;  Location: [place] [facility];  Service: Cardiovascular;  Laterality: N/A;  . [place]  . lumbar disckectomy  2006  . TUBAL LIGATION  1990    There were no vitals filed for this visit.      Subjective Assessment - 05/08/16 1728    Subjective I had a bad day yesterday.  Both sides of my back hurt today.   Patient Stated Goals I want to get out of pain.   Pain Score 8    Pain Location Back   Pain Orientation Left   Pain Descriptors / Indicators Aching;Throbbing   Pain Onset More than a month ago                         [facility] Adult PT Treatment/Exercise - 05/08/16 0001      Modalities   Modalities Electrical Stimulation;Moist Heat;Ultrasound     Moist Heat Therapy   Number Minutes Moist Heat 20 Minutes   Moist Heat Location Lumbar Spine     Electrical Stimulation   Electrical Stimulation Location Bil low back    Electrical Stimulation Action IFC    Electrical Stimulation Parameters 80-150 HZ 20 minutes   Electrical Stimulation Goals Pain     Ultrasound   Ultrasound Location Bilateral lumbar musculature.   Ultrasound Parameters 1.50 W/CM2 x 12 minutes at 1. 50  W/CM2     Manual Therapy   Manual therapy comments Left sdly position with folded pillow:  STW/M to bilateral low back including QL release and STW/M to bilateral SIJ regions x 12 minutes.                     PT Long Term Goals - 04/29/16 1747      PT LONG TERM GOAL #1   Title Independent with a HEP.   Time 6   Period Weeks   Status New     PT LONG TERM GOAL #2   Title Sit 30 minutes with pain not > 3/10.   Time 6   Period Weeks   Status New     PT LONG TERM GOAL #3   Title Perform ADL's with pain not > 3/10.   Time 6   Period Weeks   Status New             Patient will benefit from skilled therapeutic intervention in order to improve the following deficits and impairments:     Visit Diagnosis: Chronic left-sided low back pain without sciatica     Problem List Patient Active Problem List   Diagnosis Date Noted  . Back pain 02/04/2016  . Lumbar radiculopathy 02/04/2016  .  BMI 27.0-27.9,adult 02/27/2015  . Gastroesophageal reflux disease without esophagitis 02/27/2015  . Depression 02/27/2015  . Snoring 02/01/2015  . Pulmonary HTN 12/21/2014  . Sleep apnea in adult 12/21/2014  . Hyperlipidemia 12/19/2014  . Chronic pain 06/12/2013  . HTN (hypertension) 08/24/2012  . Migraines 08/24/2012  . Anxiety state 08/24/2012    Kiylee Thoreson, [place] MPT 05/08/2016, 5:55 PM  [facility] [address] [place], [place], [address] Phone: [phone]   Fax:  [phone]  Name: Dim Meisinger MRN: [id number] Date of Birth: 1960/03/12

## 2016-05-11 ENCOUNTER — Other Ambulatory Visit: Payer: Self-pay | Admitting: Nurse Practitioner

## 2016-05-11 DIAGNOSIS — K219 Gastro-esophageal reflux disease without esophagitis: Secondary | ICD-10-CM

## 2016-05-13 ENCOUNTER — Encounter: Payer: BLUE CROSS/BLUE SHIELD | Admitting: Physical Therapy

## 2016-05-15 ENCOUNTER — Encounter: Payer: BLUE CROSS/BLUE SHIELD | Admitting: *Deleted

## 2016-05-26 ENCOUNTER — Encounter: Payer: Self-pay | Admitting: Nurse Practitioner

## 2016-05-26 ENCOUNTER — Ambulatory Visit (INDEPENDENT_AMBULATORY_CARE_PROVIDER_SITE_OTHER): Payer: BLUE CROSS/BLUE SHIELD | Admitting: Nurse Practitioner

## 2016-05-26 VITALS — BP 133/87 | HR 75 | Temp 98.5°F | Ht 66.0 in | Wt 180.8 lb

## 2016-05-26 DIAGNOSIS — R609 Edema, unspecified: Secondary | ICD-10-CM | POA: Diagnosis not present

## 2016-05-26 DIAGNOSIS — H6690 Otitis media, unspecified, unspecified ear: Secondary | ICD-10-CM

## 2016-05-26 MED ORDER — FLUTICASONE PROPIONATE 50 MCG/ACT NA SUSP: 2.0000 | Freq: Every day | NASAL | 6 refills | Status: DC

## 2016-05-26 MED ORDER — FUROSEMIDE 20 MG PO TABS: 20.0000 mg | ORAL_TABLET | Freq: Every day | ORAL | 3 refills | Status: DC

## 2016-05-26 NOTE — Progress Notes (Signed)
   Subjective:    Patient ID: Lisa Parrish, female    DOB: 09-22-1960, 56 y.o.   MRN: [id number]  HPI: Pt presents with complaints of fullness in bilateral ears x 3 days and swelling in hands and feet x 6 days. No changes in diet, exercise, or medications.   Review of Systems  Constitutional: Negative.   HENT:       Pressure in bilateral ears  Cardiovascular: Negative.   Genitourinary: Negative.   Skin:       Swelling in hands and feet  Psychiatric/Behavioral: Negative.   All other systems reviewed and are negative.      Objective:   Physical Exam  Constitutional: She is oriented to person, place, and time. She appears well-developed and well-nourished.  HENT:  Right Ear: A middle ear effusion is present.  Left Ear: A middle ear effusion is present.  Eyes: Pupils are equal, round, and reactive to light.  Cardiovascular: Normal rate and regular rhythm.   Pulmonary/Chest: Effort normal and breath sounds normal.  Abdominal: Soft. Bowel sounds are normal.  Neurological: She is alert and oriented to person, place, and time.  Skin:  1+ edema in hands/feet  Psychiatric: She has a normal mood and affect. Her behavior is normal. Judgment and thought content normal.   BP 133/87   Pulse 75   Temp 98.5 F (36.9 C) (Oral)   Ht 5\' 6"  (1.676 m)   Wt 180 lb 12.8 oz (82 kg)   BMI 29.18 kg/m     Assessment & Plan:  1. Chronic otitis media, unspecified otitis media type Pt instructed to use flonase and OTC decongestant  - fluticasone (FLONASE) 50 MCG/ACT nasal spray; Place 2 sprays into both nostrils daily.  Dispense: 16 g; Refill: 6  2. Peripheral edema Low salt diet, Lasix once daily PRN - furosemide (LASIX) 20 MG tablet; Take 1 tablet (20 mg total) by mouth daily.  Dispense: 30 tablet; Refill: [place], FNP student [place], [place]

## 2016-05-26 NOTE — Patient Instructions (Signed)
Edema Edema is when you have too much fluid in your body or under your skin. Edema may make your legs, feet, and ankles swell up. Swelling is also common in looser tissues, like around your eyes. This is a common condition. It gets more common as you get older. There are many possible causes of edema. Eating too much salt (sodium) and being on your feet or sitting for a long time can cause edema in your legs, feet, and ankles. Hot weather may make edema worse. Edema is usually painless. Your skin may look swollen or shiny. Follow these instructions at home:  Keep the swollen body part raised (elevated) above the level of your heart when you are sitting or lying down.  Do not sit still or stand for a long time.  Do not wear tight clothes. Do not wear garters on your upper legs.  Exercise your legs. This can help the swelling go down.  Wear elastic bandages or support stockings as told by your doctor.  Eat a low-salt (low-sodium) diet to reduce fluid as told by your doctor.  Depending on the cause of your swelling, you may need to limit how much fluid you drink (fluid restriction).  Take over-the-counter and prescription medicines only as told by your doctor. Contact a doctor if:  Treatment is not working.  You have heart, liver, or kidney disease and have symptoms of edema.  You have sudden and unexplained weight gain. Get help right away if:  You have shortness of breath or chest pain.  You cannot breathe when you lie down.  You have pain, redness, or warmth in the swollen areas.  You have heart, liver, or kidney disease and get edema all of a sudden.  You have a fever and your symptoms get worse all of a sudden. Summary  Edema is when you have too much fluid in your body or under your skin.  Edema may make your legs, feet, and ankles swell up. Swelling is also common in looser tissues, like around your eyes.  Raise (elevate) the swollen body part above the level of your  heart when you are sitting or lying down.  Follow your doctor's instructions about diet and how much fluid you can drink (fluid restriction). This information is not intended to replace advice given to you by your health care provider. Make sure you discuss any questions you have with your health care provider. Document Released: 08/06/2007 Document Revised: 03/07/2016 Document Reviewed: 03/07/2016 Elsevier Interactive Patient Education  2017 Elsevier Inc.  

## 2016-05-27 ENCOUNTER — Ambulatory Visit: Payer: BLUE CROSS/BLUE SHIELD | Admitting: *Deleted

## 2016-05-27 DIAGNOSIS — M545 Low back pain, unspecified: Secondary | ICD-10-CM

## 2016-05-27 DIAGNOSIS — G8929 Other chronic pain: Secondary | ICD-10-CM

## 2016-05-27 NOTE — Therapy (Signed)
[facility] [place], [place], [address] Phone: [phone]   Fax:  [phone]  Physical Therapy Treatment  Patient Details  Name: Marguerita Stapp MRN: [id number] Date of Birth: 1961-02-04 Referring Provider: Dorene Ar MD.  Encounter Date: 05/27/2016      PT End of Session - 05/27/16 1802    Visit Number 2   Number of Visits 12   Date for PT Re-Evaluation 06/10/16   PT Start Time 9233   PT Stop Time 1655   PT Time Calculation (min) 50 min      Past Medical History:  Diagnosis Date  . Anxiety   . Depression   . Headache, migraine   . Hyperlipidemia   . Hypertension   . Kidney stones   . Osteoarthritis    back  . Pulmonary HTN 12/21/2014  . S/P cardiac cath 12/20/14 normal coronary arteries 12/21/2014  . Seasonal allergies     Past Surgical History:  Procedure Laterality Date  . CARDIAC CATHETERIZATION N/A 12/20/2014   Procedure: Left Heart Cath and Coronary Angiography;  Surgeon: Sherren Mocha, MD;  Location: [place] [facility];  Service: Cardiovascular;  Laterality: N/A;  . [place]  . lumbar disckectomy  2006  . TUBAL LIGATION  1990    There were no vitals filed for this visit.                       [place] Adult PT Treatment/Exercise - 05/27/16 0001      Exercises   Exercises Lumbar     Lumbar Exercises: Aerobic   Stationary Bike Nustep x 12 mins L3     Lumbar Exercises: Supine   Ab Set --  Drawin core activation exs   Bent Knee Raise 20 reps;2 seconds  marching   Bridge 10 reps;3 seconds     Lumbar Exercises: Prone   Straight Leg Raise 10 reps;3 seconds     Lumbar Exercises: Quadruped   [place] --     Modalities   Modalities Electrical Stimulation;Moist Heat;Ultrasound     Moist Heat Therapy   Number Minutes Moist Heat 20 Minutes     Electrical Stimulation   Electrical Stimulation Location Bil low back    Electrical Stimulation Action  IFC   Electrical Stimulation Parameters 80-150hz    Electrical Stimulation Goals Pain     Ultrasound   Ultrasound Location BIL. Lx paras   Ultrasound Parameters 1.5 w/cm2 x 10 min prone                     PT Long Term Goals - 04/29/16 1747      PT LONG TERM GOAL #1   Title Independent with a HEP.   Time 6   Period Weeks   Status New     PT LONG TERM GOAL #2   Title Sit 30 minutes with pain not > 3/10.   Time 6   Period Weeks   Status New     PT LONG TERM GOAL #3   Title Perform ADL's with pain not > 3/10.   Time 6   Period Weeks   Status New               Plan - 05/27/16 1803    Clinical Impression Statement Pt did fairly well today with Rx and had decreased pain afterwards. She was challenged with core activation exs and needs review for HEP. Normal response to modalities.  Rehab Potential Good   PT Frequency 2x / week   PT Duration 6 weeks   PT Treatment/Interventions ADLs/Self Care Home Management;Electrical Stimulation;Moist Heat;Ultrasound;Therapeutic activities;Therapeutic exercise;Patient/family education;Manual techniques;Dry needling   PT Next Visit Plan In prone left QL release and STW/M to SIJ region.  Dry needling.  U/S and core exercise progression.   Consulted and Agree with Plan of Care Patient      Patient will benefit from skilled therapeutic intervention in order to improve the following deficits and impairments:  Pain, Decreased activity tolerance  Visit Diagnosis: Chronic left-sided low back pain without sciatica     Problem List Patient Active Problem List   Diagnosis Date Noted  . Back pain 02/04/2016  . Lumbar radiculopathy 02/04/2016  . BMI 27.0-27.9,adult 02/27/2015  . Gastroesophageal reflux disease without esophagitis 02/27/2015  . Depression 02/27/2015  . Snoring 02/01/2015  . Pulmonary HTN 12/21/2014  . Sleep apnea in adult 12/21/2014  . Hyperlipidemia 12/19/2014  . Chronic pain 06/12/2013  . HTN  (hypertension) 08/24/2012  . Migraines 08/24/2012  . Anxiety state 08/24/2012    RAMSEUR,CHRIS, PTA 05/27/2016, 6:09 PM  [facility] [place], [place], [address] Phone: [phone]   Fax:  [phone]  Name: Lataya Varnell MRN: [id number] Date of Birth: Feb 19, 1961

## 2016-05-29 ENCOUNTER — Ambulatory Visit: Payer: BLUE CROSS/BLUE SHIELD | Admitting: Physical Therapy

## 2016-05-29 DIAGNOSIS — M545 Low back pain, unspecified: Secondary | ICD-10-CM

## 2016-05-29 DIAGNOSIS — G8929 Other chronic pain: Secondary | ICD-10-CM

## 2016-05-29 NOTE — Therapy (Signed)
[facility] [place], [place], [address] Phone: [phone]   Fax:  [phone]  Physical Therapy Treatment  Patient Details  Name: Lisa Parrish MRN: [id number] Date of Birth: 06/01/1960 Referring Provider: Dorene Ar MD.  Encounter Date: 05/29/2016    Past Medical History:  Diagnosis Date  . Anxiety   . Depression   . Headache, migraine   . Hyperlipidemia   . Hypertension   . Kidney stones   . Osteoarthritis    back  . Pulmonary HTN 12/21/2014  . S/P cardiac cath 12/20/14 normal coronary arteries 12/21/2014  . Seasonal allergies     Past Surgical History:  Procedure Laterality Date  . CARDIAC CATHETERIZATION N/A 12/20/2014   Procedure: Left Heart Cath and Coronary Angiography;  Surgeon: Sherren Mocha, MD;  Location: [place] [facility];  Service: Cardiovascular;  Laterality: N/A;  . [place]  . lumbar disckectomy  2006  . TUBAL LIGATION  1990    There were no vitals filed for this visit.      Subjective Assessment - 05/29/16 1611    Subjective I'm sore from that last treatment.   Pain Score 7    Pain Location Back   Pain Orientation Left   Pain Descriptors / Indicators Aching;Throbbing   Pain Type Chronic pain   Pain Onset More than a month ago                         [facility] Adult PT Treatment/Exercise - 05/29/16 0001      Exercises   Exercises Knee/Hip     Lumbar Exercises: Stretches   Single Knee to Chest Stretch 3 reps;30 seconds     Lumbar Exercises: Aerobic   Stationary Bike Level 4 x 15 minutes.     Lumbar Exercises: Supine   AB Set Limitations Mini-crunches slow reps 3 x 10 reps.   Other Supine Lumbar Exercises Hip bridges:  3 x 10 reps.     Modalities   Modalities Electrical Stimulation;Moist Heat     Moist Heat Therapy   Number Minutes Moist Heat 20 Minutes   Moist Heat Location Lumbar Spine     Electrical Stimulation   Electrical Stimulation  Location Bilateral low back.   Electrical Stimulation Action IFC   Electrical Stimulation Parameters 80-150 Hz x 20 minutes.   Electrical Stimulation Goals Pain                     PT Long Term Goals - 04/29/16 1747      PT LONG TERM GOAL #1   Title Independent with a HEP.   Time 6   Period Weeks   Status New     PT LONG TERM GOAL #2   Title Sit 30 minutes with pain not > 3/10.   Time 6   Period Weeks   Status New     PT LONG TERM GOAL #3   Title Perform ADL's with pain not > 3/10.   Time 6   Period Weeks   Status New             Patient will benefit from skilled therapeutic intervention in order to improve the following deficits and impairments:     Visit Diagnosis: Chronic left-sided low back pain without sciatica     Problem List Patient Active Problem List   Diagnosis Date Noted  . Back pain 02/04/2016  . Lumbar radiculopathy 02/04/2016  . BMI  27.0-27.9,adult 02/27/2015  . Gastroesophageal reflux disease without esophagitis 02/27/2015  . Depression 02/27/2015  . Snoring 02/01/2015  . Pulmonary HTN 12/21/2014  . Sleep apnea in adult 12/21/2014  . Hyperlipidemia 12/19/2014  . Chronic pain 06/12/2013  . HTN (hypertension) 08/24/2012  . Migraines 08/24/2012  . Anxiety state 08/24/2012    Jorgina Binning, [place] MPT 05/29/2016, 5:08 PM  [facility] [address] [place], [place], [address] Phone: [phone]   Fax:  [phone]  Name: Lisa Parrish MRN: [id number] Date of Birth: [birth year]

## 2016-06-03 ENCOUNTER — Encounter: Payer: Self-pay | Admitting: Physical Therapy

## 2016-06-03 ENCOUNTER — Ambulatory Visit: Payer: BLUE CROSS/BLUE SHIELD | Attending: Anesthesiology | Admitting: Physical Therapy

## 2016-06-03 DIAGNOSIS — M545 Low back pain, unspecified: Secondary | ICD-10-CM

## 2016-06-03 DIAGNOSIS — G8929 Other chronic pain: Secondary | ICD-10-CM

## 2016-06-03 NOTE — Therapy (Signed)
[facility] [place], [place], [address] Phone: [phone]   Fax:  [phone]  Physical Therapy Treatment  Patient Details  Name: Lisa Parrish MRN: [id number] Date of Birth: 1960/07/11 Referring Provider: Dorene Ar MD.  Encounter Date: 06/03/2016      PT End of Session - 06/03/16 1435    Visit Number 3   Number of Visits 12   Date for PT Re-Evaluation 06/10/16   PT Start Time 1102   PT Stop Time 1520   PT Time Calculation (min) 48 min   Activity Tolerance Patient tolerated treatment well   Behavior During Therapy [facility] for tasks assessed/performed      Past Medical History:  Diagnosis Date  . Anxiety   . Depression   . Headache, migraine   . Hyperlipidemia   . Hypertension   . Kidney stones   . Osteoarthritis    back  . Pulmonary HTN 12/21/2014  . S/P cardiac cath 12/20/14 normal coronary arteries 12/21/2014  . Seasonal allergies     Past Surgical History:  Procedure Laterality Date  . CARDIAC CATHETERIZATION N/A 12/20/2014   Procedure: Left Heart Cath and Coronary Angiography;  Surgeon: Sherren Mocha, MD;  Location: [facility];  Service: Cardiovascular;  Laterality: N/A;  . [place]  . lumbar disckectomy  2006  . TUBAL LIGATION  1990    There were no vitals filed for this visit.      Subjective Assessment - 06/03/16 1434    Subjective Reports that this is the best she has been in a while.   Pertinent History Lumbar discectomy/laminectomy.   Limitations Sitting   How long can you sit comfortably? 15-20 minutes.   Diagnostic tests MRI.     Patient Stated Goals I want to get out of pain.   Currently in Pain? Yes   Pain Score 3    Pain Location Back   Pain Orientation Left;Lower   Pain Descriptors / Indicators Aching   Pain Type Chronic pain   Pain Onset More than a month ago   Pain Frequency Constant            [facility] PT Assessment - 06/03/16 0001      Assessment   Medical Diagnosis Lumbar DDD   Next MD Visit 06/2016     Precautions   Precautions None                     [facility] Adult PT Treatment/Exercise - 06/03/16 0001      Lumbar Exercises: Aerobic   Stationary Bike Level 4 x 10 minutes.     Lumbar Exercises: Supine   Ab Set 20 reps;5 seconds   AB Set Limitations Mini-crunches slow reps 2 x 10 reps.   Bent Knee Raise 20 reps;3 seconds   Bridge 10 reps;4 seconds   Straight Leg Raise 20 reps;2 seconds     Lumbar Exercises: Prone   Straight Leg Raise 15 reps;2 seconds     Modalities   Modalities Electrical Stimulation;Moist Heat     Moist Heat Therapy   Number Minutes Moist Heat 15 Minutes   Moist Heat Location Lumbar Spine     Electrical Stimulation   Electrical Stimulation Location B Lumbar paraspinals   Electrical Stimulation Action IFC   Electrical Stimulation Parameters 1-10 hz x15 min   Electrical Stimulation Goals Pain  PT Long Term Goals - 04/29/16 1747      PT LONG TERM GOAL #1   Title Independent with a HEP.   Time 6   Period Weeks   Status New     PT LONG TERM GOAL #2   Title Sit 30 minutes with pain not > 3/10.   Time 6   Period Weeks   Status New     PT LONG TERM GOAL #3   Title Perform ADL's with pain not > 3/10.   Time 6   Period Weeks   Status New               Plan - 06/03/16 1508    Clinical Impression Statement Patient tolerated today's treatment well with only reports of LE soreness with core exercises today. Core activation and draw in were big focus of today's treatment. Patient denies any core soreness now with exercises. Patient encouraged to continue all HEP exercises as directed. Normal modalities response noted following removal of the modalities.   Rehab Potential Good   PT Frequency 2x / week   PT Duration 6 weeks   PT Treatment/Interventions ADLs/Self Care Home Management;Electrical Stimulation;Moist  Heat;Ultrasound;Therapeutic activities;Therapeutic exercise;Patient/family education;Manual techniques;Dry needling   PT Next Visit Plan In prone left QL release and STW/M to SIJ region.  Dry needling.  U/S and core exercise progression.   Consulted and Agree with Plan of Care Patient      Patient will benefit from skilled therapeutic intervention in order to improve the following deficits and impairments:  Pain, Decreased activity tolerance  Visit Diagnosis: Chronic left-sided low back pain without sciatica     Problem List Patient Active Problem List   Diagnosis Date Noted  . Back pain 02/04/2016  . Lumbar radiculopathy 02/04/2016  . BMI 27.0-27.9,adult 02/27/2015  . Gastroesophageal reflux disease without esophagitis 02/27/2015  . Depression 02/27/2015  . Snoring 02/01/2015  . Pulmonary HTN 12/21/2014  . Sleep apnea in adult 12/21/2014  . Hyperlipidemia 12/19/2014  . Chronic pain 06/12/2013  . HTN (hypertension) 08/24/2012  . Migraines 08/24/2012  . Anxiety state 08/24/2012    Lisa Parrish, PTA 06/03/2016, 3:51 PM  [facility] [address] [place], [place], [address] Phone: [phone]   Fax:  [phone]  Name: Lisa Parrish MRN: [id number] Date of Birth: [birth year]

## 2016-06-04 ENCOUNTER — Ambulatory Visit: Payer: BLUE CROSS/BLUE SHIELD | Admitting: Physical Therapy

## 2016-06-04 DIAGNOSIS — M545 Low back pain, unspecified: Secondary | ICD-10-CM

## 2016-06-04 DIAGNOSIS — G8929 Other chronic pain: Secondary | ICD-10-CM

## 2016-06-04 NOTE — Patient Instructions (Signed)

## 2016-06-04 NOTE — Therapy (Signed)
[facility] [place], [place], [address] Phone: [phone]   Fax:  [phone]  Physical Therapy Treatment  Patient Details  Name: Lisa Parrish MRN: [id number] Date of Birth: 1960-12-20 Referring Provider: Dorene Ar MD.  Encounter Date: 06/04/2016      PT End of Session - 06/04/16 1510    Visit Number 6  updated due to missed visit count   Number of Visits 12   Date for PT Re-Evaluation 06/10/16   PT Start Time 1430   PT Stop Time 1518   PT Time Calculation (min) 48 min   Activity Tolerance Patient tolerated treatment well   Behavior During Therapy [facility] for tasks assessed/performed      Past Medical History:  Diagnosis Date  . Anxiety   . Depression   . Headache, migraine   . Hyperlipidemia   . Hypertension   . Kidney stones   . Osteoarthritis    back  . Pulmonary HTN 12/21/2014  . S/P cardiac cath 12/20/14 normal coronary arteries 12/21/2014  . Seasonal allergies     Past Surgical History:  Procedure Laterality Date  . CARDIAC CATHETERIZATION N/A 12/20/2014   Procedure: Left Heart Cath and Coronary Angiography;  Surgeon: Sherren Mocha, MD;  Location: [facility];  Service: Cardiovascular;  Laterality: N/A;  . [place]  . lumbar disckectomy  2006  . TUBAL LIGATION  1990    There were no vitals filed for this visit.      Subjective Assessment - 06/04/16 1433    Subjective "I need you to make an addendum to yesterday's note.  I was a 4/10 but I also hadn't worked in 5 days."  Worked today - 7 hours; reports day was long and tiring, pain now elevated.  No increased pain after session yesterday, working increased pain.   Pertinent History Lumbar discectomy/laminectomy.   How long can you sit comfortably? 15-20 minutes.   Patient Stated Goals I want to get out of pain.   Currently in Pain? Yes   Pain Score 7    Pain Location Back   Pain Orientation Left;Lower   Pain  Descriptors / Indicators Aching   Pain Type Chronic pain   Pain Onset More than a month ago   Pain Frequency Constant                         [facility] Adult PT Treatment/Exercise - 06/04/16 1435      Lumbar Exercises: Stretches   Single Knee to Chest Stretch 3 reps;30 seconds   Single Knee to Chest Stretch Limitations bil   Lower Trunk Rotation 3 reps;30 seconds   Lower Trunk Rotation Limitations bil     Lumbar Exercises: Aerobic   Stationary Bike Level 5 x 10 minutes.     Lumbar Exercises: Supine   Ab Set 20 reps;5 seconds   Clam 20 reps   Clam Limitations with ab set   Heel Slides 20 reps   Heel Slides Limitations with ab set   Bent Knee Raise 20 reps   Bent Knee Raise Limitations with ab set     Modalities   Modalities Electrical Stimulation;Moist Heat     Moist Heat Therapy   Number Minutes Moist Heat 15 Minutes   Moist Heat Location Lumbar Spine     Electrical Stimulation   Electrical Stimulation Location B Lumbar paraspinals   Electrical Stimulation Action IFC   Electrical Stimulation Parameters to tolerance  x 15 min   Electrical Stimulation Goals Pain                PT Education - 06/04/16 1510    Education provided Yes   Education Details TENS unit   Person(s) Educated Patient   Methods Explanation   Comprehension Verbalized understanding             PT Long Term Goals - 04/29/16 1747      PT LONG TERM GOAL #1   Title Independent with a HEP.   Time 6   Period Weeks   Status New     PT LONG TERM GOAL #2   Title Sit 30 minutes with pain not > 3/10.   Time 6   Period Weeks   Status New     PT LONG TERM GOAL #3   Title Perform ADL's with pain not > 3/10.   Time 6   Period Weeks   Status New               Plan - 06/04/16 1510    Clinical Impression Statement Pt reported increased pain with core exercises, which improved when followed up with stretches.  Continued modalities as pt with benefit from these, and  provided information for home TENS unit.  Pt tolerated all exercises well despite increase in pain from work today.     PT Treatment/Interventions ADLs/Self Care Home Management;Electrical Stimulation;Moist Heat;Ultrasound;Therapeutic activities;Therapeutic exercise;Patient/family education;Manual techniques;Dry needling   PT Next Visit Plan Core/hip stabilization, modalities prn.  Dry needling.  U/S and core exercise progression.   Consulted and Agree with Plan of Care Patient      Patient will benefit from skilled therapeutic intervention in order to improve the following deficits and impairments:  Pain, Decreased activity tolerance  Visit Diagnosis: Chronic left-sided low back pain without sciatica     Problem List Patient Active Problem List   Diagnosis Date Noted  . Back pain 02/04/2016  . Lumbar radiculopathy 02/04/2016  . BMI 27.0-27.9,adult 02/27/2015  . Gastroesophageal reflux disease without esophagitis 02/27/2015  . Depression 02/27/2015  . Snoring 02/01/2015  . Pulmonary HTN 12/21/2014  . Sleep apnea in adult 12/21/2014  . Hyperlipidemia 12/19/2014  . Chronic pain 06/12/2013  . HTN (hypertension) 08/24/2012  . Migraines 08/24/2012  . Anxiety state 08/24/2012      Laureen Abrahams, PT, DPT 06/04/16 3:13 PM    [facility] [address] [place], [place], [address] Phone: [phone]   Fax:  [phone]  Name: Maleaha Hughett MRN: [id number] Date of Birth: Aug 31, 1960

## 2016-06-05 ENCOUNTER — Telehealth: Payer: Self-pay | Admitting: Nurse Practitioner

## 2016-06-05 ENCOUNTER — Encounter: Payer: Self-pay | Admitting: Nurse Practitioner

## 2016-06-05 ENCOUNTER — Ambulatory Visit (INDEPENDENT_AMBULATORY_CARE_PROVIDER_SITE_OTHER): Payer: BLUE CROSS/BLUE SHIELD | Admitting: Nurse Practitioner

## 2016-06-05 VITALS — BP 139/84 | HR 74 | Temp 97.4°F | Ht 66.0 in | Wt 184.0 lb

## 2016-06-05 DIAGNOSIS — H6521 Chronic serous otitis media, right ear: Secondary | ICD-10-CM | POA: Diagnosis not present

## 2016-06-05 MED ORDER — CHLORPHEN-PE-ACETAMINOPHEN 4-10-325 MG PO TABS: 1.0000 | ORAL_TABLET | Freq: Four times a day (QID) | ORAL | 0 refills | Status: DC | PRN

## 2016-06-05 NOTE — Telephone Encounter (Signed)
Need to see ears again

## 2016-06-05 NOTE — Telephone Encounter (Signed)
Detailed message left for patient that she would need to be seen again.

## 2016-06-05 NOTE — Progress Notes (Signed)
   Subjective:    Patient ID: Lisa Parrish, female    DOB: [birth year], 56 y.o.   MRN: [id number]  HPI Patient comes in today for recheck of right chronic otitis media- was sen on 05/26/16 with ear pain- was told to use flonase and OTC decongestant- she says it is no better.    Review of Systems  Constitutional: Negative.   HENT: Positive for ear pain. Negative for sore throat and trouble swallowing.   Respiratory: Negative.   Cardiovascular: Negative.   Genitourinary: Negative.   Neurological: Negative.   Psychiatric/Behavioral: Negative.   All other systems reviewed and are negative.      Objective:   Physical Exam  Constitutional: She is oriented to person, place, and time. She appears well-developed and well-nourished. No distress.  HENT:  Right Ear: Hearing and external ear normal. Tympanic membrane is bulging.  Nose: Nose normal.  Neck: Normal range of motion. Neck supple.  Cardiovascular: Normal rate and regular rhythm.   Pulmonary/Chest: Effort normal and breath sounds normal.  Abdominal: Soft. Bowel sounds are normal.  Neurological: She is alert and oriented to person, place, and time.  Skin: Skin is warm.  Psychiatric: She has a normal mood and affect. Her behavior is normal. Judgment and thought content normal.   BP 139/84   Pulse 74   Temp 97.4 F (36.3 C) (Oral)   Ht 5\' 6"  (1.676 m)   Wt 184 lb (83.5 kg)   BMI 29.70 kg/m      Assessment & Plan:  1. Right chronic serous otitis media *continue flonase as rx May take several more days to clear up  RTO prn  - Chlorphen-PE-Acetaminophen 4-10-325 MG TABS; Take 1 tablet by mouth every 6 (six) hours as needed.  Dispense: 10 tablet; Refill: 0   Mary-Margaret Hassell Done, FNP

## 2016-06-05 NOTE — Telephone Encounter (Signed)
Please review and advise.

## 2016-06-09 ENCOUNTER — Encounter: Payer: BLUE CROSS/BLUE SHIELD | Admitting: Physical Therapy

## 2016-06-10 ENCOUNTER — Encounter: Payer: BLUE CROSS/BLUE SHIELD | Admitting: Physical Therapy

## 2016-06-12 ENCOUNTER — Encounter: Payer: BLUE CROSS/BLUE SHIELD | Admitting: Physical Therapy

## 2016-06-17 ENCOUNTER — Encounter: Payer: Self-pay | Admitting: Physical Therapy

## 2016-06-17 ENCOUNTER — Ambulatory Visit: Payer: BLUE CROSS/BLUE SHIELD | Admitting: Physical Therapy

## 2016-06-17 DIAGNOSIS — M545 Low back pain, unspecified: Secondary | ICD-10-CM

## 2016-06-17 DIAGNOSIS — G8929 Other chronic pain: Secondary | ICD-10-CM

## 2016-06-17 NOTE — Patient Instructions (Addendum)
Lumbar Rotation: Caudal - Bilateral, Advanced (Supine)    Feet and knees together, arms outstretched, bring knees toward chest. Rotate knees to left, turning head in opposite direction until stretch is felt. Hold __5-10__ seconds. Relax. Repeat ____ times per set. Do ____ sets per session. Do __2-3__ sessions per day.  http://orth.exer.us/1022   Copyright  VHI. All rights reserved.  Straight Leg Raise    Tighten stomach and slowly raise locked right leg __5__ inches from floor. Repeat __10__ times per set. Do _2___ sets per session. Do __2-3__ sessions per day.  http://orth.exer.us/1102   Copyright  VHI. All rights reserved.  Unilateral Isometric Hip Flexion    Tighten stomach and raise right and left knee like marching in place.  Also, tighten stomach and slide heel to your buttocks. Repeat __10__ times per set. Do __2__ sets per session. Do __2-3__ sessions per day.  http://orth.exer.us/1098   Copyright  VHI. All rights reserved.

## 2016-06-17 NOTE — Therapy (Signed)
[facility] [place], [place], [address] Phone: [phone]   Fax:  [phone]  Physical Therapy Treatment  Patient Details  Name: Lisa Parrish MRN: [id number] Date of Birth: Jun 18, 1960 Referring Provider: Dorene Ar MD.  Encounter Date: 06/17/2016      PT End of Session - 06/17/16 1557    Visit Number 7   Number of Visits 12   Date for PT Re-Evaluation 06/10/16   PT Start Time 5631   PT Stop Time 1645   PT Time Calculation (min) 48 min   Activity Tolerance Patient tolerated treatment well   Behavior During Therapy [facility] for tasks assessed/performed      Past Medical History:  Diagnosis Date  . Anxiety   . Depression   . Headache, migraine   . Hyperlipidemia   . Hypertension   . Kidney stones   . Osteoarthritis    back  . Pulmonary HTN ([place]) 12/21/2014  . S/P cardiac cath 12/20/14 normal coronary arteries 12/21/2014  . Seasonal allergies     Past Surgical History:  Procedure Laterality Date  . CARDIAC CATHETERIZATION N/A 12/20/2014   Procedure: Left Heart Cath and Coronary Angiography;  Surgeon: Sherren Mocha, MD;  Location: [facility];  Service: Cardiovascular;  Laterality: N/A;  . [place]  . lumbar disckectomy  2006  . TUBAL LIGATION  1990    There were no vitals filed for this visit.      Subjective Assessment - 06/17/16 1556    Subjective Reports having to stand in one place and look down and reach all day at work which has caused increased pain. Reports pain is in the shape of smiley face across low back. Reports that she is going to order her TENS unit tonight.   Pertinent History Lumbar discectomy/laminectomy.   Limitations Sitting   How long can you sit comfortably? 15-20 minutes.   Diagnostic tests MRI.     Patient Stated Goals I want to get out of pain.   Currently in Pain? Yes   Pain Score 9    Pain Location Back   Pain Orientation Right;Left;Lower   Pain  Descriptors / Indicators Aching   Pain Type Chronic pain   Pain Onset More than a month ago   Pain Frequency Constant            [facility] PT Assessment - 06/17/16 0001      Assessment   Medical Diagnosis Lumbar DDD   Next MD Visit 06/2016     Precautions   Precautions None                     [facility] Adult PT Treatment/Exercise - 06/17/16 0001      Modalities   Modalities Electrical Stimulation;Moist Heat;Ultrasound     Moist Heat Therapy   Number Minutes Moist Heat 15 Minutes   Moist Heat Location Lumbar Spine     Electrical Stimulation   Electrical Stimulation Location B Lumbar paraspinals   Electrical Stimulation Action IFC   Electrical Stimulation Parameters 1-10 hz x15 min   Electrical Stimulation Goals Pain     Ultrasound   Ultrasound Location B lumbar paraspinals   Ultrasound Parameters 1.5 w/cm2, 100%, 1 mhz x10 min   Ultrasound Goals Pain     Manual Therapy   Manual Therapy Soft tissue mobilization   Soft tissue mobilization STW/MFR to B lumbar paraspinals and QL in L sidelying to reduce tone and pain  PT Education - 06/17/16 1652    Education provided Yes   Education Details HEP- lumbar rotation, heel slides, marching, SLR   Person(s) Educated Patient   Methods Explanation;Verbal cues;Handout   Comprehension Verbalized understanding;Verbal cues required             PT Long Term Goals - 04/29/16 1747      PT LONG TERM GOAL #1   Title Independent with a HEP.   Time 6   Period Weeks   Status New     PT LONG TERM GOAL #2   Title Sit 30 minutes with pain not > 3/10.   Time 6   Period Weeks   Status New     PT LONG TERM GOAL #3   Title Perform ADL's with pain not > 3/10.   Time 6   Period Weeks   Status New               Plan - 06/17/16 1639    Clinical Impression Statement Patient presented in clinic with reports of increased LBP from work activities that is not in her normal work station.  Patient presented with pain across her low back in what was described as a smiley face and higher than her normal pain. Increased tone noted throughout patient's B lumbar paraspinals and QL region. Normal modalities response noted following removal of the modalities. Per patient request patient was provided new HEP of exercises she had completed in previous treatment with education regarding technique. Patient experienced LBP relief upon end of treatment with no pain rating provided.   Rehab Potential Good   PT Frequency 2x / week   PT Duration 6 weeks   PT Treatment/Interventions ADLs/Self Care Home Management;Electrical Stimulation;Moist Heat;Ultrasound;Therapeutic activities;Therapeutic exercise;Patient/family education;Manual techniques;Dry needling   PT Next Visit Plan Core/hip stabilization, modalities prn.  Dry needling.  U/S and core exercise progression.   Consulted and Agree with Plan of Care Patient      Patient will benefit from skilled therapeutic intervention in order to improve the following deficits and impairments:  Pain, Decreased activity tolerance  Visit Diagnosis: Chronic left-sided low back pain without sciatica     Problem List Patient Active Problem List   Diagnosis Date Noted  . Back pain 02/04/2016  . Lumbar radiculopathy 02/04/2016  . BMI 27.0-27.9,adult 02/27/2015  . Gastroesophageal reflux disease without esophagitis 02/27/2015  . Depression 02/27/2015  . Snoring 02/01/2015  . Pulmonary HTN ([place]) 12/21/2014  . Sleep apnea in adult 12/21/2014  . Hyperlipidemia 12/19/2014  . Chronic pain 06/12/2013  . HTN (hypertension) 08/24/2012  . Migraines 08/24/2012  . Anxiety state 08/24/2012    Wynelle Fanny, PTA 06/17/2016, 5:01 PM  [facility] [address] [place], [place], [address] Phone: [phone]   Fax:  [phone]  Name: Lisa Parrish MRN: [id number] Date of Birth: 1960-12-19

## 2016-06-19 ENCOUNTER — Encounter: Payer: Self-pay | Admitting: Physical Therapy

## 2016-06-19 ENCOUNTER — Ambulatory Visit: Payer: BLUE CROSS/BLUE SHIELD | Admitting: Physical Therapy

## 2016-06-19 DIAGNOSIS — M545 Low back pain, unspecified: Secondary | ICD-10-CM

## 2016-06-19 DIAGNOSIS — G8929 Other chronic pain: Secondary | ICD-10-CM

## 2016-06-19 NOTE — Therapy (Signed)
[facility] [place], [place], [address] Phone: [phone]   Fax:  [phone]  Physical Therapy Treatment  Patient Details  Name: Lisa Parrish MRN: [id number] Date of Birth: 1960/10/19 Referring Provider: Dorene Ar MD.  Encounter Date: 06/19/2016      PT End of Session - 06/19/16 1601    Visit Number 8   Number of Visits 12   Date for PT Re-Evaluation 06/10/16   PT Start Time 1602   PT Stop Time 1644   PT Time Calculation (min) 42 min   Activity Tolerance Patient tolerated treatment well   Behavior During Therapy [facility] for tasks assessed/performed      Past Medical History:  Diagnosis Date  . Anxiety   . Depression   . Headache, migraine   . Hyperlipidemia   . Hypertension   . Kidney stones   . Osteoarthritis    back  . Pulmonary HTN ([place]) 12/21/2014  . S/P cardiac cath 12/20/14 normal coronary arteries 12/21/2014  . Seasonal allergies     Past Surgical History:  Procedure Laterality Date  . CARDIAC CATHETERIZATION N/A 12/20/2014   Procedure: Left Heart Cath and Coronary Angiography;  Surgeon: Sherren Mocha, MD;  Location: [place] [facility];  Service: Cardiovascular;  Laterality: N/A;  . [place]  . lumbar disckectomy  2006  . TUBAL LIGATION  1990    There were no vitals filed for this visit.      Subjective Assessment - 06/19/16 1600    Subjective Reports that her back felt better after previous treatment. After being immobile she reports difficulty in standing upright. Reports that she got stiff sitting in the car.   Pertinent History Lumbar discectomy/laminectomy.   Limitations Sitting   How long can you sit comfortably? 15-20 minutes.   Diagnostic tests MRI.     Patient Stated Goals I want to get out of pain.   Currently in Pain? Yes   Pain Score 8    Pain Location Back   Pain Orientation Right;Left;Lower   Pain Descriptors / Indicators Sore;Other (Comment)  Stiffness   Pain Type Chronic pain   Pain Onset More than a month ago   Pain Frequency Constant            [facility] PT Assessment - 06/19/16 0001      Assessment   Medical Diagnosis Lumbar DDD   Next MD Visit 06/2016     Precautions   Precautions None                     [facility] Adult PT Treatment/Exercise - 06/19/16 0001      Modalities   Modalities Electrical Stimulation;Moist Heat;Ultrasound     Moist Heat Therapy   Number Minutes Moist Heat 15 Minutes   Moist Heat Location Lumbar Spine     Electrical Stimulation   Electrical Stimulation Location B Lumbar paraspinals   Electrical Stimulation Action IFC   Electrical Stimulation Parameters 1-10 hz x15 mn   Electrical Stimulation Goals Pain;Tone     Ultrasound   Ultrasound Location B lumbar paraspinals   Ultrasound Parameters 1.5 w/cm2, 100%, 1 mhz x10 min   Ultrasound Goals Pain     Manual Therapy   Manual Therapy Soft tissue mobilization   Soft tissue mobilization STW/MFR to B lumbar paraspinals and QL in L sidelying to reduce tone and pain  PT Long Term Goals - 04/29/16 1747      PT LONG TERM GOAL #1   Title Independent with a HEP.   Time 6   Period Weeks   Status New     PT LONG TERM GOAL #2   Title Sit 30 minutes with pain not > 3/10.   Time 6   Period Weeks   Status New     PT LONG TERM GOAL #3   Title Perform ADL's with pain not > 3/10.   Time 6   Period Weeks   Status New               Plan - 06/19/16 1641    Clinical Impression Statement Patient presented in clinic with reports of increased LBP and stiffness especially after being sedentary for prolonged time. Patient presented with increased tone throughout B lumbar paraspinals and QL region today with no reports of any sensitivity with manual therapy. Normal modalities response noted following removal of the modalities. Also educated patient that when home TENS unit arrives to bring with her to treatment  and she will be taught set up.   Rehab Potential Good   PT Frequency 2x / week   PT Duration 6 weeks   PT Treatment/Interventions ADLs/Self Care Home Management;Electrical Stimulation;Moist Heat;Ultrasound;Therapeutic activities;Therapeutic exercise;Patient/family education;Manual techniques;Dry needling   PT Next Visit Plan Core/hip stabilization, modalities prn.  Dry needling.  U/S and core exercise progression.   Consulted and Agree with Plan of Care Patient      Patient will benefit from skilled therapeutic intervention in order to improve the following deficits and impairments:  Pain, Decreased activity tolerance  Visit Diagnosis: Chronic left-sided low back pain without sciatica     Problem List Patient Active Problem List   Diagnosis Date Noted  . Back pain 02/04/2016  . Lumbar radiculopathy 02/04/2016  . BMI 27.0-27.9,adult 02/27/2015  . Gastroesophageal reflux disease without esophagitis 02/27/2015  . Depression 02/27/2015  . Snoring 02/01/2015  . Pulmonary HTN ([place]) 12/21/2014  . Sleep apnea in adult 12/21/2014  . Hyperlipidemia 12/19/2014  . Chronic pain 06/12/2013  . HTN (hypertension) 08/24/2012  . Migraines 08/24/2012  . Anxiety state 08/24/2012    Wynelle Fanny, PTA 06/19/2016, 4:49 PM  [facility] [address] [place], [place], [address] Phone: [phone]   Fax:  [phone]  Name: Lisa Parrish MRN: [id number] Date of Birth: March 24, 1960

## 2016-06-24 ENCOUNTER — Ambulatory Visit: Payer: BLUE CROSS/BLUE SHIELD | Admitting: Physical Therapy

## 2016-06-24 DIAGNOSIS — M545 Low back pain, unspecified: Secondary | ICD-10-CM

## 2016-06-24 DIAGNOSIS — G8929 Other chronic pain: Secondary | ICD-10-CM

## 2016-06-24 NOTE — Therapy (Addendum)
[facility] [place], [place], [address] Phone: [phone]   Fax:  [phone]  Physical Therapy Treatment  Patient Details  Name: Lisa Parrish MRN: [id number] Date of Birth: Sep 01, 1960 Referring Provider: Dorene Ar MD.  Encounter Date: 06/24/2016      PT End of Session - 06/24/16 1711    Visit Number 9   Number of Visits 12   Date for PT Re-Evaluation 06/10/16   PT Start Time 0436   Activity Tolerance Patient tolerated treatment well   Behavior During Therapy [facility] for tasks assessed/performed      Past Medical History:  Diagnosis Date  . Anxiety   . Depression   . Headache, migraine   . Hyperlipidemia   . Hypertension   . Kidney stones   . Osteoarthritis    back  . Pulmonary HTN ([place]) 12/21/2014  . S/P cardiac cath 12/20/14 normal coronary arteries 12/21/2014  . Seasonal allergies     Past Surgical History:  Procedure Laterality Date  . CARDIAC CATHETERIZATION N/A 12/20/2014   Procedure: Left Heart Cath and Coronary Angiography;  Surgeon: Sherren Mocha, MD;  Location: [place];  Service: Cardiovascular;  Laterality: N/A;  . [place]  . lumbar disckectomy  2006  . TUBAL LIGATION  1990    There were no vitals filed for this visit.      Subjective Assessment - 06/24/16 1708    Subjective My pain is around an 8 to 9 over 10 today.   Patient Stated Goals I want to get out of pain.   Pain Score 8    Pain Location Back   Pain Orientation Right;Left;Lower   Pain Onset More than a month ago                         [facility] Adult PT Treatment/Exercise - 06/24/16 0001      Exercises   Exercises Knee/Hip     Lumbar Exercises: Aerobic   Stationary Bike Level 4 Nustep x 10 minutes.     Ultrasound   Ultrasound Location Bil lower lumbar musculature.   Ultrasound Parameters 1.50 W/CM2 x 8 minutes.   Ultrasound Goals Pain     Manual Therapy   Manual Therapy  Soft tissue mobilization   Soft tissue mobilization Prone over pillow:  Performed STW/M x 8 minutes to patient's lumbar affected musculature.      Patient received HMP and IFC x 15 minutes to low back.               PT Long Term Goals - 04/29/16 1747      PT LONG TERM GOAL #1   Title Independent with a HEP.   Time 6   Period Weeks   Status New     PT LONG TERM GOAL #2   Title Sit 30 minutes with pain not > 3/10.   Time 6   Period Weeks   Status New     PT LONG TERM GOAL #3   Title Perform ADL's with pain not > 3/10.   Time 6   Period Weeks   Status New               Plan - 06/24/16 1712    Clinical Impression Statement Patient consistently reports high pain-levels upon presenting to the clinic.  She continues to have stiffness and increased tone in her lumbar musculature.  Her physical therapy treatments have provided her with  temporary relief og pain.      Patient will benefit from skilled therapeutic intervention in order to improve the following deficits and impairments:  Pain, Decreased activity tolerance  Visit Diagnosis: Chronic left-sided low back pain without sciatica     Problem List Patient Active Problem List   Diagnosis Date Noted  . Back pain 02/04/2016  . Lumbar radiculopathy 02/04/2016  . BMI 27.0-27.9,adult 02/27/2015  . Gastroesophageal reflux disease without esophagitis 02/27/2015  . Depression 02/27/2015  . Snoring 02/01/2015  . Pulmonary HTN ([place]) 12/21/2014  . Sleep apnea in adult 12/21/2014  . Hyperlipidemia 12/19/2014  . Chronic pain 06/12/2013  . HTN (hypertension) 08/24/2012  . Migraines 08/24/2012  . Anxiety state 08/24/2012    Sahalie Beth, [place]  MPT 06/24/2016, 5:22 PM  [facility] [address] [place], [place], [address] Phone: [phone]   Fax:  [phone]  Name: Lisa Parrish MRN: [id number] Date of Birth: 1960-03-13

## 2016-06-26 ENCOUNTER — Ambulatory Visit: Payer: BLUE CROSS/BLUE SHIELD | Admitting: Physical Therapy

## 2016-06-26 ENCOUNTER — Other Ambulatory Visit: Payer: Self-pay | Admitting: *Deleted

## 2016-06-26 DIAGNOSIS — R609 Edema, unspecified: Secondary | ICD-10-CM

## 2016-06-26 DIAGNOSIS — G8929 Other chronic pain: Secondary | ICD-10-CM

## 2016-06-26 DIAGNOSIS — M545 Low back pain, unspecified: Secondary | ICD-10-CM

## 2016-06-26 MED ORDER — FUROSEMIDE 20 MG PO TABS: 20.0000 mg | ORAL_TABLET | Freq: Every day | ORAL | 0 refills | Status: DC

## 2016-06-26 NOTE — Therapy (Addendum)
[facility] [place], [place], [address] Phone: [phone]   Fax:  [phone]  Physical Therapy Treatment  Patient Details  Name: Yurani Fettes MRN: [id number] Date of Birth: 06-Feb-1961 Referring Provider: Dorene Ar MD.  Encounter Date: 06/26/2016      PT End of Session - 06/26/16 1759    Visit Number 10   Number of Visits 12   Date for PT Re-Evaluation 06/10/16   PT Start Time 0445   PT Stop Time 0540   PT Time Calculation (min) 55 min   Activity Tolerance Patient tolerated treatment well   Behavior During Therapy [facility] for tasks assessed/performed      Past Medical History:  Diagnosis Date  . Anxiety   . Depression   . Headache, migraine   . Hyperlipidemia   . Hypertension   . Kidney stones   . Osteoarthritis    back  . Pulmonary HTN ([place]) 12/21/2014  . S/P cardiac cath 12/20/14 normal coronary arteries 12/21/2014  . Seasonal allergies     Past Surgical History:  Procedure Laterality Date  . CARDIAC CATHETERIZATION N/A 12/20/2014   Procedure: Left Heart Cath and Coronary Angiography;  Surgeon: Sherren Mocha, MD;  Location: [facility];  Service: Cardiovascular;  Laterality: N/A;  . [place]  . lumbar disckectomy  2006  . TUBAL LIGATION  1990    There were no vitals filed for this visit.      Subjective Assessment - 06/26/16 1749    Subjective My pain is around an 8 to 9 over 10 today.  I scheduled to have 2 injections.  I got a TENS unit.   Pain Score 9    Pain Location Back   Pain Orientation Right;Left;Lower   Pain Descriptors / Indicators Sore   Pain Type Chronic pain   Pain Onset More than a month ago                         [facility] Adult PT Treatment/Exercise - 06/26/16 0001      Exercises   Exercises Knee/Hip     Lumbar Exercises: Aerobic   Stationary Bike Level 4 x 10 minutes.     Acupuncturist Location  Bil low back.   Electrical Stimulation Action IFC   Electrical Stimulation Parameters 80-150 Hz x 20 minutes.   Electrical Stimulation Goals Pain     Ultrasound   Ultrasound Location --  Bil lower lumbar musculature.   Ultrasound Parameters Combo e'stim/U/S at 1.50 W/CM2 x 8 minutes.     Manual Therapy   Manual Therapy Soft tissue mobilization   Soft tissue mobilization In prone:  STW/M x 10 minutes to affected lower lumbar region.                     PT Long Term Goals - 06/26/16 1801      PT LONG TERM GOAL #1   Title Independent with a HEP.   Time 6   Period Weeks   Status On-going     PT LONG TERM GOAL #2   Title Sit 30 minutes with pain not > 3/10.   Time 6   Period Weeks   Status On-going     PT LONG TERM GOAL #3   Title Perform ADL's with pain not > 3/10.   Time 6   Period Weeks   Status On-going  Plan - 06/26/16 1759    Clinical Impression Statement Will place patient on hold at this time.  She is experiencing only temporary relief of pain.  She is going to receive 2 injections in her lumbar spine.      Patient will benefit from skilled therapeutic intervention in order to improve the following deficits and impairments:  Pain, Decreased activity tolerance  Visit Diagnosis: Chronic left-sided low back pain without sciatica     Problem List Patient Active Problem List   Diagnosis Date Noted  . Back pain 02/04/2016  . Lumbar radiculopathy 02/04/2016  . BMI 27.0-27.9,adult 02/27/2015  . Gastroesophageal reflux disease without esophagitis 02/27/2015  . Depression 02/27/2015  . Snoring 02/01/2015  . Pulmonary HTN ([place]) 12/21/2014  . Sleep apnea in adult 12/21/2014  . Hyperlipidemia 12/19/2014  . Chronic pain 06/12/2013  . HTN (hypertension) 08/24/2012  . Migraines 08/24/2012  . Anxiety state 08/24/2012    Keslie Gritz, [place] MPT 06/26/2016, 6:05 PM  [facility] [address] [place], [place], [address] Phone: [phone]   Fax:  [phone]  Name: Erricka Falkner MRN: [id number] Date of Birth: 1960/07/23  PHYSICAL THERAPY DISCHARGE SUMMARY  Visits from Start of Care: 10.  Current functional level related to goals / functional outcomes: See above.   Remaining deficits: See goal section.   Education / Equipment: HEP. Plan: Patient agrees to discharge.  Patient goals were not met. Patient is being discharged due to not returning since the last visit.  ?????          [place] Blessed Girdner MPT

## 2016-08-07 ENCOUNTER — Other Ambulatory Visit: Payer: Self-pay | Admitting: Nurse Practitioner

## 2016-08-07 DIAGNOSIS — R609 Edema, unspecified: Secondary | ICD-10-CM

## 2016-08-21 ENCOUNTER — Other Ambulatory Visit: Payer: Self-pay | Admitting: Family Medicine

## 2016-08-25 ENCOUNTER — Encounter: Payer: Self-pay | Admitting: Gastroenterology

## 2016-09-05 ENCOUNTER — Ambulatory Visit (INDEPENDENT_AMBULATORY_CARE_PROVIDER_SITE_OTHER): Payer: BLUE CROSS/BLUE SHIELD | Admitting: Nurse Practitioner

## 2016-09-05 ENCOUNTER — Encounter: Payer: Self-pay | Admitting: Nurse Practitioner

## 2016-09-05 VITALS — BP 119/88 | HR 71 | Temp 97.6°F | Ht 66.0 in | Wt 180.0 lb

## 2016-09-05 DIAGNOSIS — Z01419 Encounter for gynecological examination (general) (routine) without abnormal findings: Secondary | ICD-10-CM | POA: Diagnosis not present

## 2016-09-05 DIAGNOSIS — F32A Depression, unspecified: Secondary | ICD-10-CM

## 2016-09-05 DIAGNOSIS — Z6827 Body mass index (BMI) 27.0-27.9, adult: Secondary | ICD-10-CM

## 2016-09-05 DIAGNOSIS — I272 Pulmonary hypertension, unspecified: Secondary | ICD-10-CM

## 2016-09-05 DIAGNOSIS — F329 Major depressive disorder, single episode, unspecified: Secondary | ICD-10-CM

## 2016-09-05 DIAGNOSIS — K219 Gastro-esophageal reflux disease without esophagitis: Secondary | ICD-10-CM

## 2016-09-05 DIAGNOSIS — E782 Mixed hyperlipidemia: Secondary | ICD-10-CM

## 2016-09-05 DIAGNOSIS — Z0000 Encounter for general adult medical examination without abnormal findings: Secondary | ICD-10-CM

## 2016-09-05 DIAGNOSIS — R609 Edema, unspecified: Secondary | ICD-10-CM

## 2016-09-05 DIAGNOSIS — I10 Essential (primary) hypertension: Secondary | ICD-10-CM

## 2016-09-05 DIAGNOSIS — F411 Generalized anxiety disorder: Secondary | ICD-10-CM

## 2016-09-05 DIAGNOSIS — G43019 Migraine without aura, intractable, without status migrainosus: Secondary | ICD-10-CM

## 2016-09-05 LAB — URINALYSIS
Bilirubin, UA: NEGATIVE
Glucose, UA: NEGATIVE
Ketones, UA: NEGATIVE
Nitrite, UA: NEGATIVE
Protein, UA: NEGATIVE
Specific Gravity, UA: 1.015 (ref 1.005–1.030)
Urobilinogen, Ur: 0.2 mg/dL (ref 0.2–1.0)
pH, UA: 8.5 — ABNORMAL HIGH (ref 5.0–7.5)

## 2016-09-05 MED ORDER — PANTOPRAZOLE SODIUM 40 MG PO TBEC: 40.0000 mg | DELAYED_RELEASE_TABLET | Freq: Two times a day (BID) | ORAL | 5 refills | Status: DC

## 2016-09-05 MED ORDER — FUROSEMIDE 20 MG PO TABS: 20.0000 mg | ORAL_TABLET | Freq: Every day | ORAL | 0 refills | Status: DC

## 2016-09-05 MED ORDER — BUPROPION HCL ER (XL) 300 MG PO TB24: 300.0000 mg | ORAL_TABLET | Freq: Every day | ORAL | 0 refills | Status: DC

## 2016-09-05 MED ORDER — METOPROLOL SUCCINATE ER 50 MG PO TB24: 50.0000 mg | ORAL_TABLET | Freq: Every day | ORAL | 1 refills | Status: DC

## 2016-09-05 NOTE — Progress Notes (Signed)
Subjective:    Patient ID: Lisa Parrish, female    DOB: 11/21/1960, 56 y.o.   MRN: [id number]  HPI  Lisa Parrish is here today for follow up of chronic medical problem.  Outpatient Encounter Prescriptions as of 09/05/2016  Medication Sig  . azelastine (OPTIVAR) 0.05 % ophthalmic solution Place 1 drop into both eyes daily as needed (for allergy eye).  Marland Kitchen buPROPion (WELLBUTRIN XL) 300 MG 24 hr tablet TAKE 1 TABLET (300 MG TOTAL) BY MOUTH DAILY.  . fluticasone (FLONASE) 50 MCG/ACT nasal spray Place 2 sprays into both nostrils daily.  . furosemide (LASIX) 20 MG tablet TAKE 1 TABLET (20 MG TOTAL) BY MOUTH DAILY.  . metoprolol succinate (TOPROL-XL) 50 MG 24 hr tablet TAKE 1 TABLET (50 MG TOTAL) BY MOUTH DAILY. TAKE WITH OR IMMEDIATELY FOLLOWING A MEAL.  . Multiple Vitamins-Minerals (HAIR/SKIN/NAILS PO) Take 1 tablet by mouth daily. Reported on 03/19/2015  . oxyCODONE-acetaminophen (PERCOCET/ROXICET) 5-325 MG tablet Take 1 tablet by mouth every 8 (eight) hours as needed for severe pain.  . pantoprazole (PROTONIX) 40 MG tablet TAKE 1 TABLET (40 MG TOTAL) BY MOUTH 2 (TWO) TIMES DAILY BEFORE A MEAL.    1. Pulmonary HTN ([place]) No c/o chest pain or sob- does not check blood pressure at home.  2. Intractable migraine without aura and without status migrainosus   3. Essential hypertension  Again no c/o chest pain,SOB or headache  4. Gastroesophageal reflux disease without esophagitis  Currently on protonix daily which works well to keep symptoms under control  5. Mixed hyperlipidemia   Does not really watch diet  6. Depression, unspecified depression type  On wellbutrin daiy- working well for her. Depression screen [facility] 2/9 09/05/2016 06/05/2016 05/26/2016 03/18/2016 02/18/2016  Decreased Interest 0 0 0 0 0  Down, Depressed, Hopeless 0 0 0 0 0  PHQ - 2 Score 0 0 0 0 0     7. BMI 27.0-27.9,adult  No recent weight change  8. Anxiety state  Only takes wellbutrin. Says she is doing well right  now     New complaints: No complaints but she wanted [place] to know that sheis having back surgery on July 17,2018.     Review of Systems  Constitutional: Positive for activity change. Negative for appetite change.  HENT: Negative.   Eyes: Negative for pain.  Respiratory: Negative for shortness of breath.   Cardiovascular: Negative for chest pain, palpitations and leg swelling.  Gastrointestinal: Negative for abdominal pain.  Endocrine: Negative for polydipsia.  Genitourinary: Negative.   Skin: Negative for rash.  Neurological: Negative for dizziness, weakness and headaches.  Hematological: Does not bruise/bleed easily.  Psychiatric/Behavioral: Negative.   All other systems reviewed and are negative.      Objective:   Physical Exam  Constitutional: She is oriented to person, place, and time. She appears well-developed and well-nourished.  HENT:  Head: Normocephalic.  Right Ear: Hearing, tympanic membrane, external ear and ear canal normal.  Left Ear: Hearing, tympanic membrane, external ear and ear canal normal.  Nose: Nose normal.  Mouth/Throat: Uvula is midline and oropharynx is clear and moist.  Eyes: Conjunctivae and EOM are normal. Pupils are equal, round, and reactive to light.  Neck: Normal range of motion and full passive range of motion without pain. Neck supple. No JVD present. Carotid bruit is not present. No thyroid mass and no thyromegaly present.  Cardiovascular: Normal rate, normal heart sounds and intact distal pulses.   No murmur heard. Pulmonary/Chest: Effort normal and breath  sounds normal. Right breast exhibits no inverted nipple, no mass, no nipple discharge, no skin change and no tenderness. Left breast exhibits no inverted nipple, no mass, no nipple discharge, no skin change and no tenderness.  Abdominal: Soft. Bowel sounds are normal. She exhibits no mass. There is no tenderness.  Genitourinary: Vagina normal and uterus normal. No breast swelling, tenderness,  discharge or bleeding. No vaginal discharge found.  Genitourinary Comments: bimanual exam-No adnexal masses or tenderness. Cervix parous and pink  Musculoskeletal: Normal range of motion.  Lymphadenopathy:    She has no cervical adenopathy.  Neurological: She is alert and oriented to person, place, and time.  Skin: Skin is warm and dry.  Psychiatric: She has a normal mood and affect. Her behavior is normal. Judgment and thought content normal.   BP 119/88   Pulse 71   Temp 97.6 F (36.4 C) (Oral)   Ht '5\' 6"'  (1.676 m)   Wt 180 lb (81.6 kg)   BMI 29.05 kg/m       Assessment & Plan:  1. Annual physical exam - Urinalysis - Thyroid Panel With TSH - CBC with Differential/Platelet  2. Gynecologic exam normal - IGP, Aptima HPV, rfx 16/18,45  3. Pulmonary HTN (HCC) Low sodium diet  4. Intractable migraine without aura and without status migrainosus  5. Essential hypertension Low sodium diet - CMP14+EGFR  6. Gastroesophageal reflux disease without esophagitis Avoid spicy foods Do not eat 2 hours prior to bedtime - pantoprazole (PROTONIX) 40 MG tablet; Take 1 tablet (40 mg total) by mouth 2 (two) times daily before a meal.  Dispense: 60 tablet; Refill: 5  7. Mixed hyperlipidemia Low fat diet - Lipid panel  8. Depression, unspecified depression type Stress management - buPROPion (WELLBUTRIN XL) 300 MG 24 hr tablet; Take 1 tablet (300 mg total) by mouth daily.  Dispense: 90 tablet; Refill: 0  9. BMI 27.0-27.9,adult Discussed diet and exercise for person with BMI >25 Will recheck weight in 3-6 months  10. Anxiety state  11. Essential hypertension, benign - metoprolol succinate (TOPROL-XL) 50 MG 24 hr tablet; Take 1 tablet (50 mg total) by mouth daily. Take with or immediately following a meal.  Dispense: 90 tablet; Refill: 1  12. Peripheral edema elevate legs when siting - furosemide (LASIX) 20 MG tablet; Take 1 tablet (20 mg total) by mouth daily.  Dispense: 90  tablet; Refill: 0    Labs pending Health maintenance reviewed Diet and exercise encouraged Continue all meds Follow up  In 3 month   [place], FNP

## 2016-09-05 NOTE — Patient Instructions (Signed)
Stress and Stress Management Stress is a normal reaction to life events. It is what you feel when life demands more than you are used to or more than you can handle. Some stress can be useful. For example, the stress reaction can help you catch the last bus of the day, study for a test, or meet a deadline at work. But stress that occurs too often or for too long can cause problems. It can affect your emotional health and interfere with relationships and normal daily activities. Too much stress can weaken your immune system and increase your risk for physical illness. If you already have a medical problem, stress can make it worse. What are the causes? All sorts of life events may cause stress. An event that causes stress for one person may not be stressful for another person. Major life events commonly cause stress. These may be positive or negative. Examples include losing your job, moving into a new home, getting married, having a baby, or losing a loved one. Less obvious life events may also cause stress, especially if they occur day after day or in combination. Examples include working long hours, driving in traffic, caring for children, being in debt, or being in a difficult relationship. What are the signs or symptoms? Stress may cause emotional symptoms including, the following:  Anxiety. This is feeling worried, afraid, on edge, overwhelmed, or out of control.  Anger. This is feeling irritated or impatient.  Depression. This is feeling sad, down, helpless, or guilty.  Difficulty focusing, remembering, or making decisions.  Stress may cause physical symptoms, including the following:  Aches and pains. These may affect your head, neck, back, stomach, or other areas of your body.  Tight muscles or clenched jaw.  Low energy or trouble sleeping.  Stress may cause unhealthy behaviors, including the following:  Eating to feel better (overeating) or skipping meals.  Sleeping too little,  too much, or both.  Working too much or putting off tasks (procrastination).  Smoking, drinking alcohol, or using drugs to feel better.  How is this diagnosed? Stress is diagnosed through an assessment by your health care provider. Your health care provider will ask questions about your symptoms and any stressful life events.Your health care provider will also ask about your medical history and may order blood tests or other tests. Certain medical conditions and medicine can cause physical symptoms similar to stress. Mental illness can cause emotional symptoms and unhealthy behaviors similar to stress. Your health care provider may refer you to a mental health professional for further evaluation. How is this treated? Stress management is the recommended treatment for stress.The goals of stress management are reducing stressful life events and coping with stress in healthy ways. Techniques for reducing stressful life events include the following:  Stress identification. Self-monitor for stress and identify what causes stress for you. These skills may help you to avoid some stressful events.  Time management. Set your priorities, keep a calendar of events, and learn to say "no." These tools can help you avoid making too many commitments.  Techniques for coping with stress include the following:  Rethinking the problem. Try to think realistically about stressful events rather than ignoring them or overreacting. Try to find the positives in a stressful situation rather than focusing on the negatives.  Exercise. Physical exercise can release both physical and emotional tension. The key is to find a form of exercise you enjoy and do it regularly.  Relaxation techniques. These relax the body and  mind. Examples include yoga, meditation, tai chi, biofeedback, deep breathing, progressive muscle relaxation, listening to music, being out in nature, journaling, and other hobbies. Again, the key is to find  one or more that you enjoy and can do regularly.  Healthy lifestyle. Eat a balanced diet, get plenty of sleep, and do not smoke. Avoid using alcohol or drugs to relax.  Strong support network. Spend time with family, friends, or other people you enjoy being around.Express your feelings and talk things over with someone you trust.  Counseling or talktherapy with a mental health professional may be helpful if you are having difficulty managing stress on your own. Medicine is typically not recommended for the treatment of stress.Talk to your health care provider if you think you need medicine for symptoms of stress. Follow these instructions at home:  Keep all follow-up visits as directed by your health care provider.  Take all medicines as directed by your health care provider. Contact a health care provider if:  Your symptoms get worse or you start having new symptoms.  You feel overwhelmed by your problems and can no longer manage them on your own. Get help right away if:  You feel like hurting yourself or someone else. This information is not intended to replace advice given to you by your health care provider. Make sure you discuss any questions you have with your health care provider. Document Released: 08/13/2000 Document Revised: 07/26/2015 Document Reviewed: 10/12/2012 Elsevier Interactive Patient Education  2017 [employer]  

## 2016-09-06 LAB — LIPID PANEL
Chol/HDL Ratio: 5.3 ratio — ABNORMAL HIGH (ref 0.0–4.4)
Cholesterol, Total: 200 mg/dL — ABNORMAL HIGH (ref 100–199)
HDL: 38 mg/dL — ABNORMAL LOW (ref >39)
LDL Calculated: 96 mg/dL (ref 0–99)
Triglycerides: 329 mg/dL — ABNORMAL HIGH (ref 0–149)
VLDL Cholesterol Cal: 66 mg/dL — ABNORMAL HIGH (ref 5–40)

## 2016-09-06 LAB — CMP14+EGFR
ALT: 18 IU/L (ref 0–32)
AST: 16 IU/L (ref 0–40)
Albumin/Globulin Ratio: 2 (ref 1.2–2.2)
Albumin: 4.6 g/dL (ref 3.5–5.5)
Alkaline Phosphatase: 104 IU/L (ref 39–117)
BUN/Creatinine Ratio: 21 (ref 9–23)
BUN: 15 mg/dL (ref 6–24)
Bilirubin Total: 0.3 mg/dL (ref 0.0–1.2)
CO2: 24 mmol/L (ref 20–29)
Calcium: 9.4 mg/dL (ref 8.7–10.2)
Chloride: 100 mmol/L (ref 96–106)
Creatinine, Ser: 0.72 mg/dL (ref 0.57–1.00)
GFR calc Af Amer: 109 mL/min/{1.73_m2} (ref >59)
GFR calc non Af Amer: 95 mL/min/{1.73_m2} (ref >59)
Globulin, Total: 2.3 g/dL (ref 1.5–4.5)
Glucose: 91 mg/dL (ref 65–99)
Potassium: 4.6 mmol/L (ref 3.5–5.2)
Sodium: 141 mmol/L (ref 134–144)
Total Protein: 6.9 g/dL (ref 6.0–8.5)

## 2016-09-06 LAB — CBC WITH DIFFERENTIAL/PLATELET
Basophils Absolute: 0.1 10*3/uL (ref 0.0–0.2)
Basos: 1 %
EOS (ABSOLUTE): 0.2 10*3/uL (ref 0.0–0.4)
Eos: 4 %
Hematocrit: 35.7 % (ref 34.0–46.6)
Hemoglobin: 12.2 g/dL (ref 11.1–15.9)
Immature Grans (Abs): 0 10*3/uL (ref 0.0–0.1)
Immature Granulocytes: 0 %
Lymphocytes Absolute: 1.9 10*3/uL (ref 0.7–3.1)
Lymphs: 38 %
MCH: 28.2 pg (ref 26.6–33.0)
MCHC: 34.2 g/dL (ref 31.5–35.7)
MCV: 82 fL (ref 79–97)
Monocytes Absolute: 0.5 10*3/uL (ref 0.1–0.9)
Monocytes: 10 %
Neutrophils Absolute: 2.4 10*3/uL (ref 1.4–7.0)
Neutrophils: 47 %
Platelets: 216 10*3/uL (ref 150–379)
RBC: 4.33 x10E6/uL (ref 3.77–5.28)
RDW: 14 % (ref 12.3–15.4)
WBC: 5 10*3/uL (ref 3.4–10.8)

## 2016-09-06 LAB — THYROID PANEL WITH TSH
Free Thyroxine Index: 2 (ref 1.2–4.9)
T3 Uptake Ratio: 29 % (ref 24–39)
T4, Total: 6.9 ug/dL (ref 4.5–12.0)
TSH: 1.33 u[IU]/mL (ref 0.450–4.500)

## 2016-09-08 LAB — IGP, APTIMA HPV, RFX 16/18,45
HPV Aptima: NEGATIVE
PAP Smear Comment: 0

## 2016-09-16 ENCOUNTER — Encounter: Payer: Self-pay | Admitting: Cardiovascular Disease

## 2016-09-24 ENCOUNTER — Encounter: Payer: Self-pay | Admitting: Cardiovascular Disease

## 2016-09-29 ENCOUNTER — Other Ambulatory Visit: Payer: BLUE CROSS/BLUE SHIELD | Admitting: Nurse Practitioner

## 2016-09-29 ENCOUNTER — Encounter: Payer: BLUE CROSS/BLUE SHIELD | Admitting: *Deleted

## 2016-09-30 ENCOUNTER — Encounter: Payer: Self-pay | Admitting: *Deleted

## 2016-10-03 ENCOUNTER — Ambulatory Visit (INDEPENDENT_AMBULATORY_CARE_PROVIDER_SITE_OTHER): Payer: BLUE CROSS/BLUE SHIELD | Admitting: Cardiovascular Disease

## 2016-10-03 ENCOUNTER — Encounter: Payer: Self-pay | Admitting: *Deleted

## 2016-10-03 ENCOUNTER — Encounter: Payer: Self-pay | Admitting: Cardiovascular Disease

## 2016-10-03 VITALS — BP 150/98 | HR 51 | Ht 66.0 in | Wt 181.0 lb

## 2016-10-03 DIAGNOSIS — R001 Bradycardia, unspecified: Secondary | ICD-10-CM | POA: Diagnosis not present

## 2016-10-03 DIAGNOSIS — R0609 Other forms of dyspnea: Secondary | ICD-10-CM | POA: Diagnosis not present

## 2016-10-03 DIAGNOSIS — R9431 Abnormal electrocardiogram [ECG] [EKG]: Secondary | ICD-10-CM | POA: Diagnosis not present

## 2016-10-03 DIAGNOSIS — R0600 Dyspnea, unspecified: Secondary | ICD-10-CM

## 2016-10-03 DIAGNOSIS — Z01818 Encounter for other preprocedural examination: Secondary | ICD-10-CM

## 2016-10-03 DIAGNOSIS — I10 Essential (primary) hypertension: Secondary | ICD-10-CM

## 2016-10-03 MED ORDER — CHLORTHALIDONE 25 MG PO TABS: 25.0000 mg | ORAL_TABLET | Freq: Every day | ORAL | 1 refills | Status: DC

## 2016-10-03 NOTE — Progress Notes (Signed)
CARDIOLOGY CONSULT NOTE  Patient ID: Lisa Parrish MRN: [id number] DOB/AGE: 11/01/1960 56 y.o.  Admit date: (Not on file) Primary Physician: Chevis Pretty, [place] Referring Physician: Hassell Done  Reason for Consultation: abnormal ECG, preop clearance  HPI: Lisa Parrish is a 56 y.o. female who is being seen today for the evaluation of Abnormal ECG and preoperative risk stratification at the request of [place] FNP.   I last saw her in October 2016 when I consulted on her for chest pain.  She had angiographically normal coronary arteries.  Echocardiogram at that time showed normal left ventricle systolic function, LVEF 14-78%, indeterminate grade diastolic dysfunction, mild mitral, tricuspid, and pulmonic regurgitation, moderate left atrial dilatation, elevated pulmonary pressures, 46 mmHg.  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  Recent ECG which I personally interpreted demonstrated sinus rhythm with PVCs and nonspecific inferior Q waves.  For the past few years she has been experiencing progressive exertional dyspnea. She has also noticed that her heart rate has been low. Today it is 51 bpm. She takes Toprol-XL 50 mg daily. She has also been taking Lasix 20 mg daily for the past few weeks for ankle swelling bilaterally.  She denies chest pain. She has noticed more exertional dyspnea when walking uphill.  She underwent an echocardiogram on 09/16/2016 and I am requesting this report.  Recent labs showed normal CBC, normal TSH of 1.3, total cholesterol 200, mildly elevated triglycerides of 329, HDL 38, LDL 96.  She seldom has palpitations.    Allergies  Allergen Reactions  . Hydrocodone     Norco  . Morphine And Related Nausea And Vomiting  . Tramadol Other (See Comments)    Upset stomach    Current Outpatient Prescriptions  Medication Sig Dispense Refill  .  azelastine (OPTIVAR) 0.05 % ophthalmic solution Place 1 drop into both eyes daily as needed (for allergy eye). 6 mL 12  . buPROPion (WELLBUTRIN XL) 300 MG 24 hr tablet Take 1 tablet (300 mg total) by mouth daily. 90 tablet 0  . fluticasone (FLONASE) 50 MCG/ACT nasal spray Place 2 sprays into both nostrils daily. 16 g 6  . furosemide (LASIX) 20 MG tablet Take 1 tablet (20 mg total) by mouth daily. 90 tablet 0  . metoprolol succinate (TOPROL-XL) 50 MG 24 hr tablet Take 1 tablet (50 mg total) by mouth daily. Take with or immediately following a meal. 90 tablet 1  . Multiple Vitamins-Minerals (HAIR/SKIN/NAILS PO) Take 1 tablet by mouth daily. Reported on 03/19/2015    . oxyCODONE-acetaminophen (PERCOCET/ROXICET) 5-325 MG tablet Take 1 tablet by mouth every 8 (eight) hours as needed for severe pain.    . pantoprazole (PROTONIX) 40 MG tablet Take 1 tablet (40 mg total) by mouth 2 (two) times daily before a meal. 60 tablet 5   No current facility-administered medications for this visit.     Past Medical History:  Diagnosis Date  . Anxiety   . Depression   . Headache, migraine   . Hyperlipidemia   . Hypertension   . Kidney stones   . Osteoarthritis    back  . Pulmonary HTN ([place]) 12/21/2014  . S/P cardiac cath 12/20/14 normal coronary arteries 12/21/2014  . Seasonal allergies     Past Surgical History:  Procedure Laterality Date  . CARDIAC CATHETERIZATION N/A 12/20/2014   Procedure: Left Heart Cath and Coronary Angiography;  Surgeon: Sherren Mocha, MD;  Location: [facility];  Service: Cardiovascular;  Laterality: N/A;  . [place]  . lumbar disckectomy  2006  . TUBAL LIGATION  1990    Social History   Social History  . Marital status: Married    Spouse name: N/A  . Number of children: N/A  . Years of education: N/A   Occupational History  . Not on file.   Social History Main Topics  . Smoking status: Former Smoker    Packs/day: 1.00    Years: 10.00     Types: Cigarettes    Quit date: 09/18/1991  . Smokeless tobacco: Never Used  . Alcohol use No  . Drug use: No     Comment: no IVDA  . Sexual activity: Yes   Other Topics Concern  . Not on file   Social History Narrative   Married, works as a Radiation protection practitioner.       No family history of premature CAD in 1st degree relatives.  Current Meds  Medication Sig  . azelastine (OPTIVAR) 0.05 % ophthalmic solution Place 1 drop into both eyes daily as needed (for allergy eye).  Marland Kitchen buPROPion (WELLBUTRIN XL) 300 MG 24 hr tablet Take 1 tablet (300 mg total) by mouth daily.  . fluticasone (FLONASE) 50 MCG/ACT nasal spray Place 2 sprays into both nostrils daily.  . furosemide (LASIX) 20 MG tablet Take 1 tablet (20 mg total) by mouth daily.  . metoprolol succinate (TOPROL-XL) 50 MG 24 hr tablet Take 1 tablet (50 mg total) by mouth daily. Take with or immediately following a meal.  . Multiple Vitamins-Minerals (HAIR/SKIN/NAILS PO) Take 1 tablet by mouth daily. Reported on 03/19/2015  . oxyCODONE-acetaminophen (PERCOCET/ROXICET) 5-325 MG tablet Take 1 tablet by mouth every 8 (eight) hours as needed for severe pain.  . pantoprazole (PROTONIX) 40 MG tablet Take 1 tablet (40 mg total) by mouth 2 (two) times daily before a meal.      Review of systems complete and found to be negative unless listed above in HPI    Physical exam Blood pressure (!) 150/98, pulse (!) 51, height 5\' 6"  (1.676 m), weight 181 lb (82.1 kg), SpO2 97 %. General: NAD Neck: No JVD, no thyromegaly or thyroid nodule.  Lungs: Clear to auscultation bilaterally with normal respiratory effort. CV: Nondisplaced PMI. Regular rate and rhythm, normal S1/S2, no S3/S4, no murmur.  No peripheral edema.  No carotid bruit.    Abdomen: Soft, nontender, no distention.  Skin: Intact without lesions or rashes.  Neurologic: Alert and oriented x 3.  Psych: Normal affect. Extremities: No clubbing or cyanosis.  HEENT: Normal.    ECG: Most recent ECG reviewed.   Labs: Lab Results  Component Value Date/Time   K 4.6 09/05/2016 09:30 AM   BUN 15 09/05/2016 09:30 AM   CREATININE 0.72 09/05/2016 09:30 AM   CREATININE 0.74 03/19/2015 05:02 PM   ALT 18 09/05/2016 09:30 AM   TSH 1.330 09/05/2016 09:30 AM   HGB 12.2 09/05/2016 09:30 AM     Lipids: Lab Results  Component Value Date/Time   LDLCALC 96 09/05/2016 09:30 AM   LDLDIRECT 124 01/18/2015 04:35 PM   CHOL 200 (H) 09/05/2016 09:30 AM   TRIG 329 (H) 09/05/2016 09:30 AM   HDL 38 (L) 09/05/2016 09:30 AM        ASSESSMENT AND PLAN:  1. Exertional dyspnea: This may simply be due to iatrogenic chronotropic incompetence due to beta blocker therapy. She seldom bothered by palpitations. Blockers are not first-line  therapy for the treatment of hypertension. We spoke at length about different medication options and she would like to come off metoprolol. I have instructed her to take 25 mg daily for the next 3 days and then to discontinue it altogether. She has no appreciable murmurs on physical examination. I will request a copy of the echocardiogram report to assess for valvular heart disease and compared to her echocardiogram performed in October 2016.  2. Hypertension: I am weaning her off metoprolol and starting her on chlorthalidone 25 mg daily. I'll check a basic metabolic panel in 5 days.  3. Preoperative risk stratification: At this point I do not see any prohibitive factors. I think she can safely proceed with surgery. I will review her echocardiogram report.  Disposition: Follow up in 3 months  Time spent: 40 minutes, of which greater than 50% was spent reviewing symptoms, relevant blood tests and studies, and discussing management plan with the patient.   Signed: Kate Sable, M.D., F.A.C.C.  10/03/2016, 9:26 AM

## 2016-10-03 NOTE — Patient Instructions (Addendum)
Your physician recommends that you schedule a follow-up appointment in: [place] physician has recommended you make the following change in your medication:   TAKE 1/2 TABLT (25 MG)  TOPROL XL daily for 3 days then stop   STOP LASIX  START CHLORTHALIDONE 25 MG DAILY  Your physician recommends that you return for lab work in: [place] has requested that you have an echocardiogram. Echocardiography is a painless test that uses sound waves to create images of your heart. It provides your doctor with information about the size and shape of your heart and how well your heart's chambers and valves are working. This procedure takes approximately one hour. There are no restrictions for this procedure.  Thank you for choosing [facility]!!

## 2016-10-03 NOTE — Addendum Note (Signed)
Addended by: Julian Hy T on: 10/03/2016 12:04 PM   Modules accepted: Orders

## 2016-10-07 ENCOUNTER — Telehealth: Payer: Self-pay | Admitting: Cardiovascular Disease

## 2016-10-07 NOTE — Telephone Encounter (Signed)
Pre-cert Verification for the following procedure   Ecbo scheduled for 8-8  At [facility].

## 2016-10-08 ENCOUNTER — Ambulatory Visit (HOSPITAL_COMMUNITY)
Admission: RE | Admit: 2016-10-08 | Discharge: 2016-10-08 | Disposition: A | Discharge status: Home / Self Care | Payer: BLUE CROSS/BLUE SHIELD | Source: Ambulatory Visit | Attending: Cardiovascular Disease | Admitting: Cardiovascular Disease

## 2016-10-08 DIAGNOSIS — K219 Gastro-esophageal reflux disease without esophagitis: Secondary | ICD-10-CM | POA: Insufficient documentation

## 2016-10-08 DIAGNOSIS — R9431 Abnormal electrocardiogram [ECG] [EKG]: Secondary | ICD-10-CM

## 2016-10-08 DIAGNOSIS — E785 Hyperlipidemia, unspecified: Secondary | ICD-10-CM | POA: Diagnosis not present

## 2016-10-08 DIAGNOSIS — G4730 Sleep apnea, unspecified: Secondary | ICD-10-CM | POA: Diagnosis not present

## 2016-10-08 DIAGNOSIS — I358 Other nonrheumatic aortic valve disorders: Secondary | ICD-10-CM | POA: Insufficient documentation

## 2016-10-08 DIAGNOSIS — I119 Hypertensive heart disease without heart failure: Secondary | ICD-10-CM | POA: Diagnosis present

## 2016-10-08 LAB — ECHOCARDIOGRAM COMPLETE
E decel time: 254 msec
E/e' ratio: 7.67
FS: 42 % (ref 28–44)
IVS/LV PW RATIO, ED: 1.1
LA ID, A-P, ES: 34 mm
LA diam end sys: 34 mm
LA diam index: 1.72 cm/m2
LA vol A4C: 35.7 ml
LA vol index: 18 mL/m2
LA vol: 35.7 mL
LV E/e' medial: 7.67
LV E/e'average: 7.67
LV PW d: 9.11 mm — AB (ref 0.6–1.1)
LV dias vol index: 26 mL/m2
LV dias vol: 52 mL (ref 46–106)
LV e' LATERAL: 8.49 cm/s
LV sys vol index: 10 mL/m2
LV sys vol: 20 mL (ref 14–42)
LVOT SV: 70 mL
LVOT VTI: 22.4 cm
LVOT area: 3.14 cm2
LVOT diameter: 20 mm
LVOT peak grad rest: 6 mmHg
LVOT peak vel: 122 cm/s
Lateral S' vel: 15.4 cm/s
MV Dec: 254
MV pk A vel: 87.4 m/s
MV pk E vel: 65.1 m/s
RV sys press: 31 mmHg
Reg peak vel: 266 cm/s
Simpson's disk: 61
Stroke v: 32 ml
TAPSE: 22.4 mm
TDI e' lateral: 8.49
TDI e' medial: 6.31
TR max vel: 266 cm/s

## 2016-10-08 NOTE — Progress Notes (Signed)
*  PRELIMINARY RESULTS* Echocardiogram 2D Echocardiogram has been performed.  Lisa Parrish 10/08/2016, 11:38 AM

## 2016-10-09 ENCOUNTER — Telehealth: Payer: Self-pay | Admitting: Cardiovascular Disease

## 2016-10-09 NOTE — Telephone Encounter (Signed)
Patient is calling to get test results of echo - needs to have surgery next week.

## 2016-10-09 NOTE — Telephone Encounter (Signed)
Please advise if clear for surgery.  Echo results are available, but not sent back to me for notification.

## 2016-10-09 NOTE — Telephone Encounter (Signed)
Can proceed with surgery

## 2016-10-09 NOTE — Telephone Encounter (Signed)
Notes recorded by Laurine Blazer, LPN on 04/05/3005 at 6:22 PM EDT Patient notified. Copy to pmd & Dr. Carloyn Manner. ------  Notes recorded by Herminio Commons, MD on 10/09/2016 at 11:35 AM EDT Normal pumping function.

## 2016-10-10 ENCOUNTER — Telehealth: Payer: Self-pay | Admitting: Cardiovascular Disease

## 2016-10-10 NOTE — Telephone Encounter (Signed)
Dr. Rex Kras office needs a statement faxed to the office in regards to giving Lisa Parrish cardiac clearance. They need this ASAP   -meeting with [facility] doctor on Tuesday.

## 2016-10-10 NOTE — Telephone Encounter (Signed)
Spoke with Dr. Rex Kras office. They state that they have information needed for surgery.

## 2016-10-15 IMAGING — CR DG CHEST PORT 1 VIEW
1 series · 1 of 1 positions shown · non-contrast
Comparison: 02/24/2013

CLINICAL DATA: Central chest pain for 2 hours.

EXAM:
PORTABLE CHEST 1 VIEW

[ap]
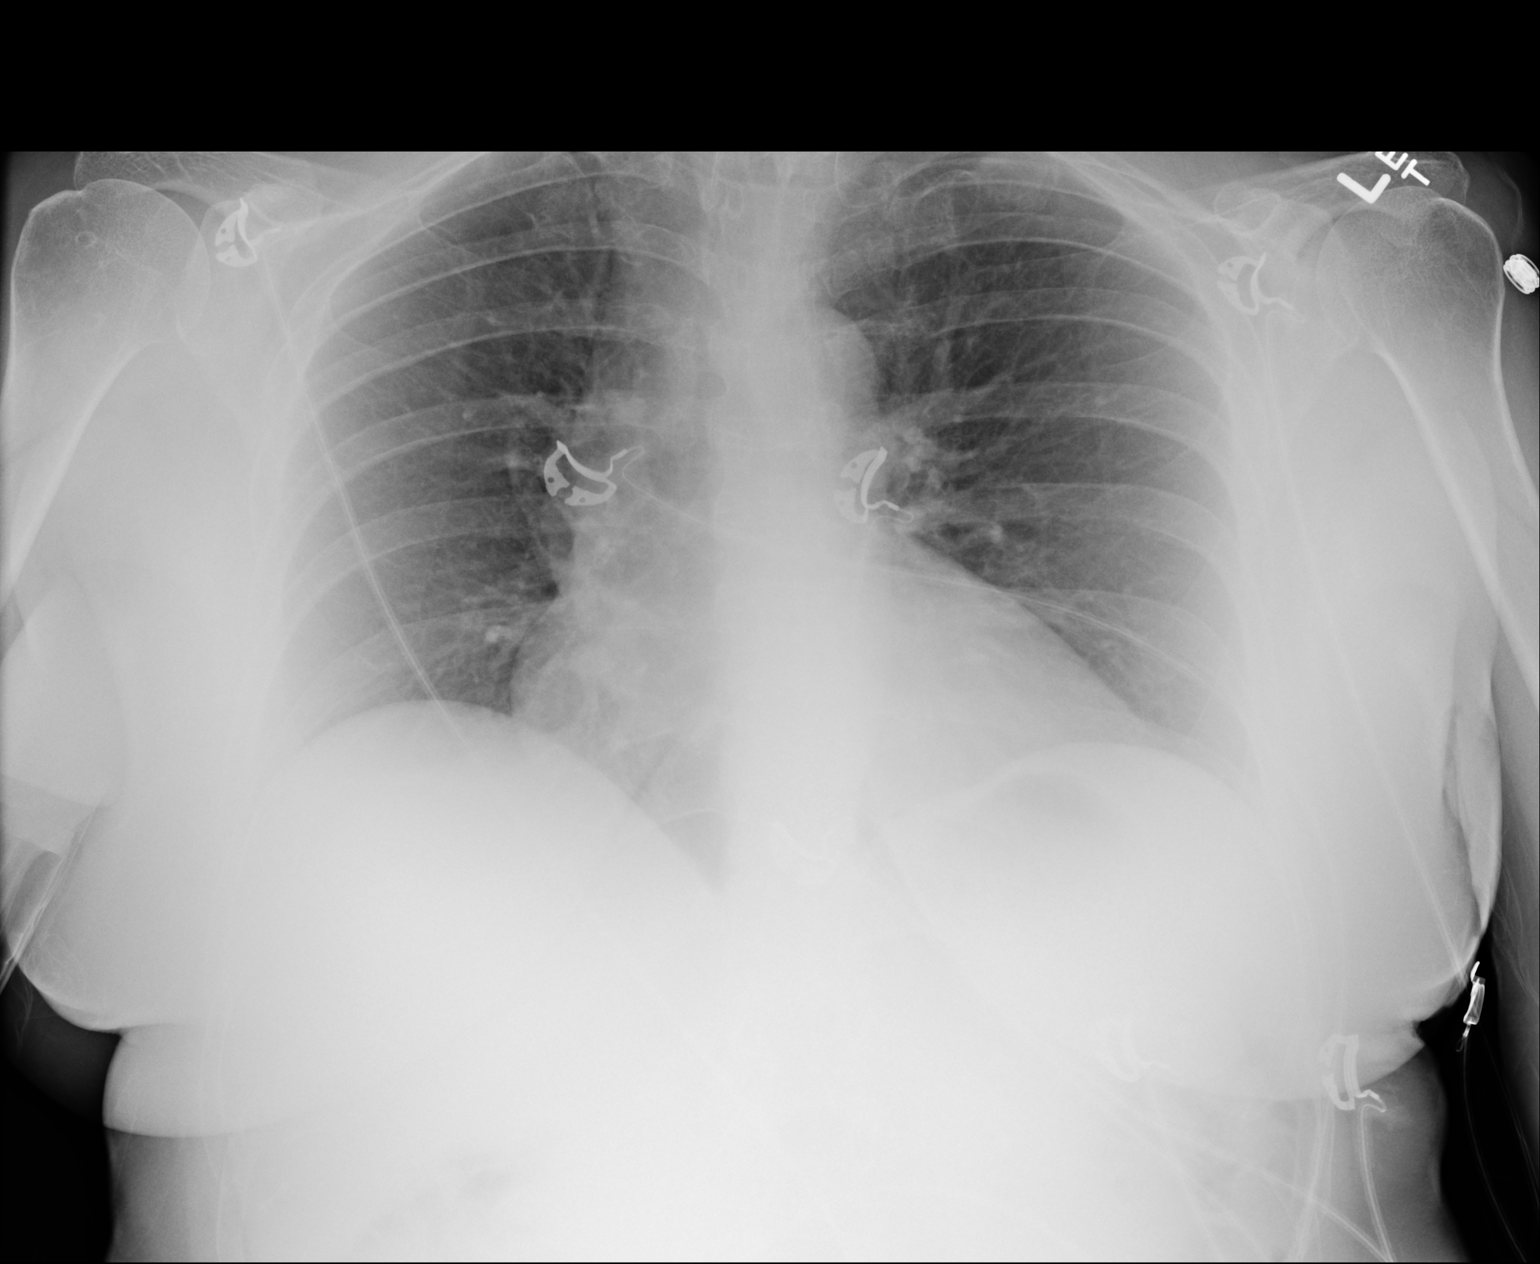

[1 of 1 positions shown; findings below may reference images not displayed]

FINDINGS: Lung volumes are low. Cardiomediastinal contours are unchanged
allowing for differences in technique. No pulmonary edema, confluent
airspace disease, pleural effusion or pneumothorax. No acute osseous
abnormalities are seen.
IMPRESSION: Hypoventilatory chest without acute process.

## 2016-10-17 ENCOUNTER — Telehealth: Payer: Self-pay | Admitting: Cardiovascular Disease

## 2016-10-17 NOTE — Telephone Encounter (Signed)
-----   Message from Acquanetta Chain, LPN sent at 8/62/8241 11:57 AM EDT -----   ----- Message ----- From: Herminio Commons, MD Sent: 10/16/2016  11:53 AM To: Acquanetta Chain, LPN  Good

## 2016-10-17 NOTE — Telephone Encounter (Signed)
Patient notified. Routed to PCP 

## 2016-10-17 NOTE — Telephone Encounter (Signed)
Patient received call from [facility] yesterday

## 2016-11-11 ENCOUNTER — Other Ambulatory Visit: Payer: Self-pay | Admitting: Cardiology

## 2016-11-12 ENCOUNTER — Other Ambulatory Visit: Payer: Self-pay | Admitting: Cardiology

## 2016-11-12 ENCOUNTER — Telehealth: Payer: Self-pay | Admitting: Nurse Practitioner

## 2016-11-12 MED ORDER — AZELASTINE HCL 0.05 % OP SOLN: 1.0000 [drp] | Freq: Every day | OPHTHALMIC | 12 refills | Status: DC | PRN

## 2016-11-12 NOTE — Telephone Encounter (Signed)
What is the name of the medication? azelastine (OPTIVAR) 0.05 % ophthalmic solution  Have you contacted your pharmacy to request a refill? Yes, but denied because pt needs an appt but she cannot come in she had back surgery three weeks ago  Which pharmacy would you like this sent to? [facility] [place].   Patient notified that their request is being sent to the clinical staff for review and that they should receive a call once it is complete. If they do not receive a call within 24 hours they can check with their pharmacy or our office.

## 2017-01-06 ENCOUNTER — Encounter: Payer: Self-pay | Admitting: Cardiovascular Disease

## 2017-01-06 ENCOUNTER — Ambulatory Visit: Payer: BLUE CROSS/BLUE SHIELD | Admitting: Cardiovascular Disease

## 2017-01-06 VITALS — BP 140/72 | HR 85 | Ht 66.0 in | Wt 184.0 lb

## 2017-01-06 DIAGNOSIS — R0609 Other forms of dyspnea: Secondary | ICD-10-CM

## 2017-01-06 DIAGNOSIS — R0600 Dyspnea, unspecified: Secondary | ICD-10-CM

## 2017-01-06 DIAGNOSIS — I10 Essential (primary) hypertension: Secondary | ICD-10-CM

## 2017-01-06 NOTE — Patient Instructions (Signed)

## 2017-01-06 NOTE — Progress Notes (Signed)
SUBJECTIVE: The patient presents for routine follow-up.  Echocardiogram 10/08/16 showed vigorous left ventricular systolic function and normal regional wall motion, LVEF 65-70%, mild LVH with grade 1 diastolic dysfunction.  She underwent back surgery on October 15, 2016.  She is wearing a back brace primarily when she goes out.  She has some mild back pain and her blood pressure is a little higher than usual at 140/72 today.  The patient denies any symptoms of chest pain, palpitations, shortness of breath, lightheadedness, dizziness, leg swelling, orthopnea, PND, and syncope.      Review of Systems: As per "subjective", otherwise negative.  Allergies  Allergen Reactions  . Hydrocodone     Norco  . Morphine And Related Nausea And Vomiting  . Tramadol Other (See Comments)    Upset stomach    Current Outpatient Medications  Medication Sig Dispense Refill  . azelastine (OPTIVAR) 0.05 % ophthalmic solution Place 1 drop into both eyes daily as needed (for allergy eye). 6 mL 12  . buPROPion (WELLBUTRIN XL) 300 MG 24 hr tablet Take 1 tablet (300 mg total) by mouth daily. 90 tablet 0  . chlorthalidone (HYGROTON) 25 MG tablet Take 1 tablet (25 mg total) by mouth daily. 90 tablet 1  . fluticasone (FLONASE) 50 MCG/ACT nasal spray Place 2 sprays into both nostrils daily. 16 g 6  . Multiple Vitamins-Minerals (HAIR/SKIN/NAILS PO) Take 1 tablet by mouth daily. Reported on 03/19/2015    . oxyCODONE-acetaminophen (PERCOCET/ROXICET) 5-325 MG tablet Take 1 tablet by mouth every 8 (eight) hours as needed for severe pain.    . pantoprazole (PROTONIX) 40 MG tablet Take 1 tablet (40 mg total) by mouth 2 (two) times daily before a meal. 60 tablet 5   No current facility-administered medications for this visit.     Past Medical History:  Diagnosis Date  . Anxiety   . Depression   . Headache, migraine   . Hyperlipidemia   . Hypertension   . Kidney stones   . Osteoarthritis    back  . Pulmonary  HTN ([place]) 12/21/2014  . S/P cardiac cath 12/20/14 normal coronary arteries 12/21/2014  . Seasonal allergies     Past Surgical History:  Procedure Laterality Date  . [place]  . lumbar disckectomy  2006  . TUBAL LIGATION  1990    Social History   Socioeconomic History  . Marital status: Married    Spouse name: Not on file  . Number of children: Not on file  . Years of education: Not on file  . Highest education level: Not on file  Social Needs  . Financial resource strain: Not on file  . Food insecurity - worry: Not on file  . Food insecurity - inability: Not on file  . Transportation needs - medical: Not on file  . Transportation needs - non-medical: Not on file  Occupational History  . Not on file  Tobacco Use  . Smoking status: Former Smoker    Packs/day: 1.00    Years: 10.00    Pack years: 10.00    Types: Cigarettes    Last attempt to quit: 09/18/1991    Years since quitting: 25.3  . Smokeless tobacco: Never Used  Substance and Sexual Activity  . Alcohol use: No  . Drug use: No    Comment: no IVDA  . Sexual activity: Yes  Other Topics Concern  . Not on file  Social History Narrative   Married, works as a Therapist, art  representative.       Vitals:   01/06/17 0847  BP: 140/72  Pulse: 85  SpO2: 96%  Weight: 184 lb (83.5 kg)  Height: 5\' 6"  (1.676 m)    Wt Readings from Last 3 Encounters:  01/06/17 184 lb (83.5 kg)  10/03/16 181 lb (82.1 kg)  09/05/16 180 lb (81.6 kg)     PHYSICAL EXAM General: NAD HEENT: Normal. Neck: No JVD, no thyromegaly. Lungs: Clear to auscultation bilaterally with normal respiratory effort. CV: Regular rate and rhythm, normal S1/S2, no S3/S4, no murmur. No pretibial or periankle edema.  No carotid bruit.   Abdomen: Soft, nontender, no distention.  Neurologic: Alert and oriented.  Psych: Normal affect. Skin: Normal. Musculoskeletal: No gross deformities.    ECG: Most recent ECG  reviewed.   Labs: Lab Results  Component Value Date/Time   K 4.6 09/05/2016 09:30 AM   BUN 15 09/05/2016 09:30 AM   CREATININE 0.72 09/05/2016 09:30 AM   CREATININE 0.74 03/19/2015 05:02 PM   ALT 18 09/05/2016 09:30 AM   TSH 1.330 09/05/2016 09:30 AM   HGB 12.2 09/05/2016 09:30 AM     Lipids: Lab Results  Component Value Date/Time   LDLCALC 96 09/05/2016 09:30 AM   LDLDIRECT 124 01/18/2015 04:35 PM   CHOL 200 (H) 09/05/2016 09:30 AM   TRIG 329 (H) 09/05/2016 09:30 AM   HDL 38 (L) 09/05/2016 09:30 AM       ASSESSMENT AND PLAN: 1. Exertional dyspnea: She is presently asymptomatic.  This may have been due to iatrogenic chronotropic incompetence due to beta-blocker therapy which I discontinued at her last visit.  She has normal left ventricular systolic function and grade 1 diastolic dysfunction.  2. Hypertension: Borderline elevation of blood pressure on chlorthalidone 25 mg daily.  She has some mild back pain today which is likely exacerbating this.  This will need further monitoring and adjustments as needed.     Disposition: Follow up 6 months as per patient preference   Kate Sable, M.D., F.A.C.C.

## 2017-01-13 IMAGING — CR DG CHEST 2V
2 series · 2 of 2 positions shown · non-contrast
Comparison: 03/29/2014

CLINICAL DATA: Cough and congestion for 6 weeks, hypertension,
pulmonary hypertension, former smoker

EXAM:
CHEST  2 VIEW

[view not recorded (1 of 2)]
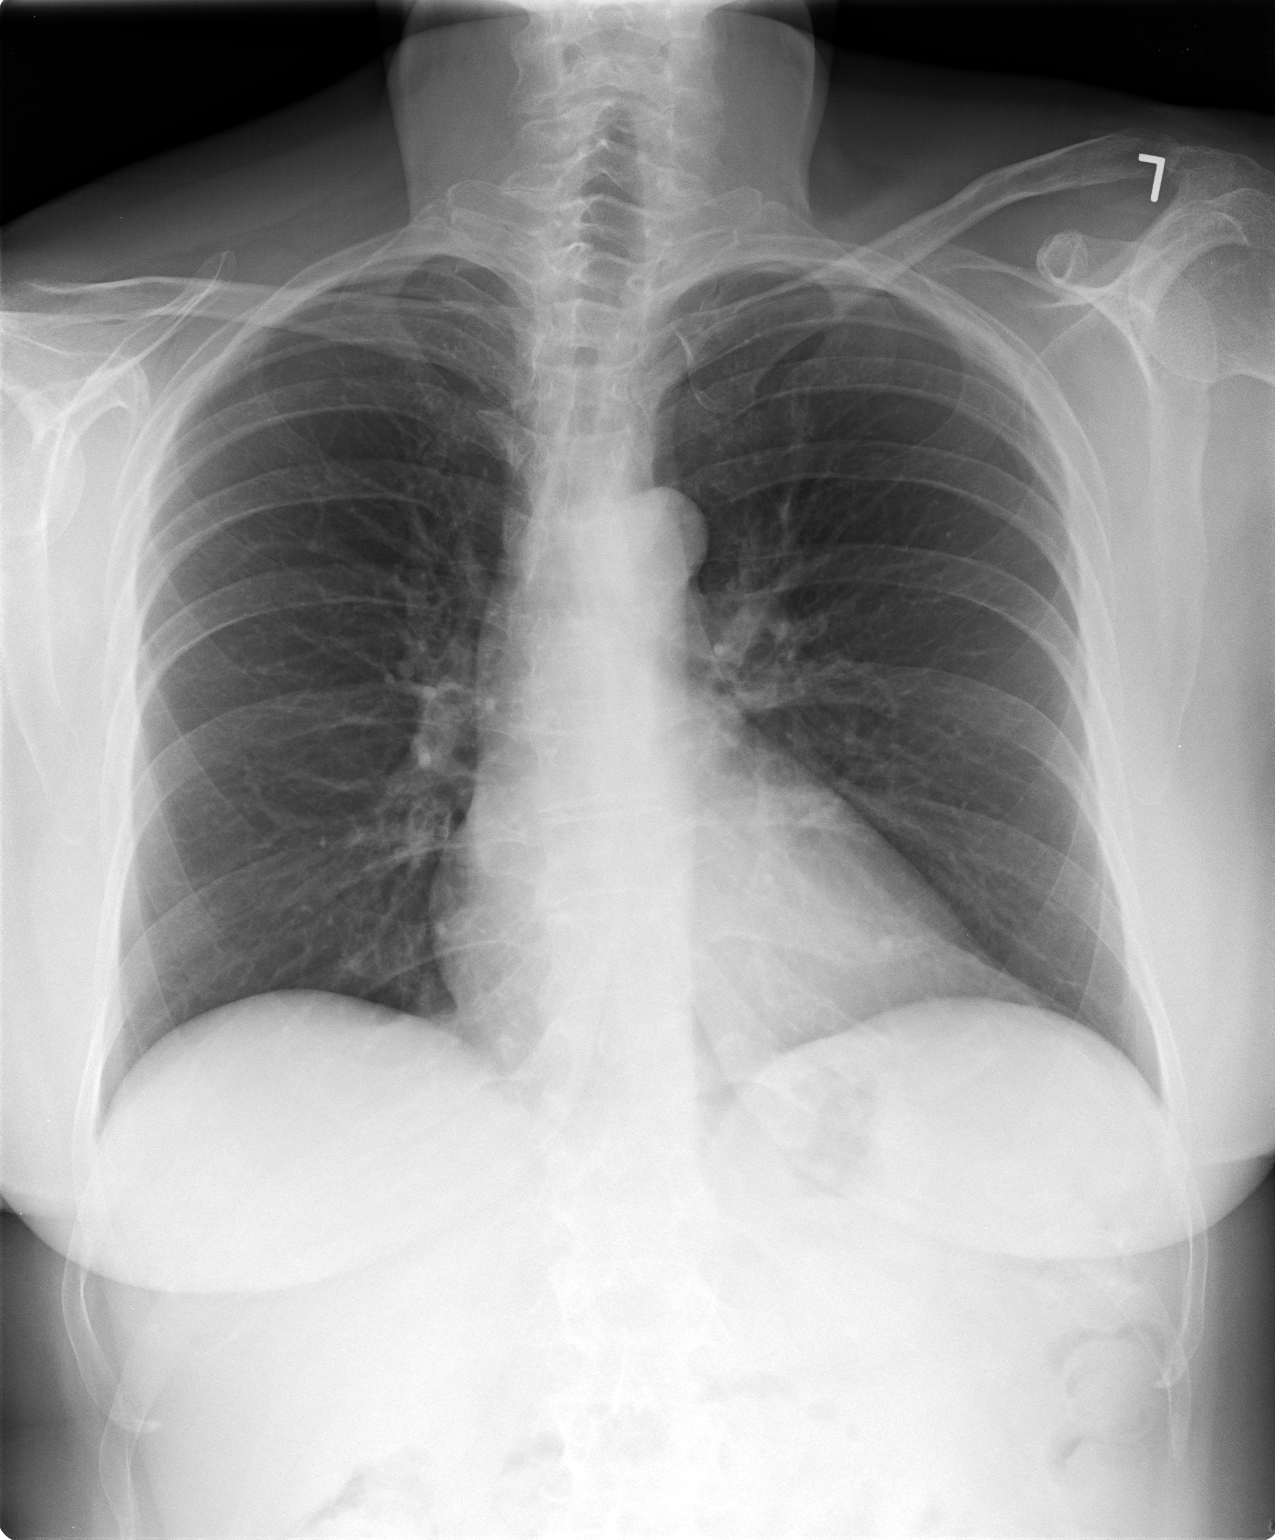

[view not recorded (2 of 2)]
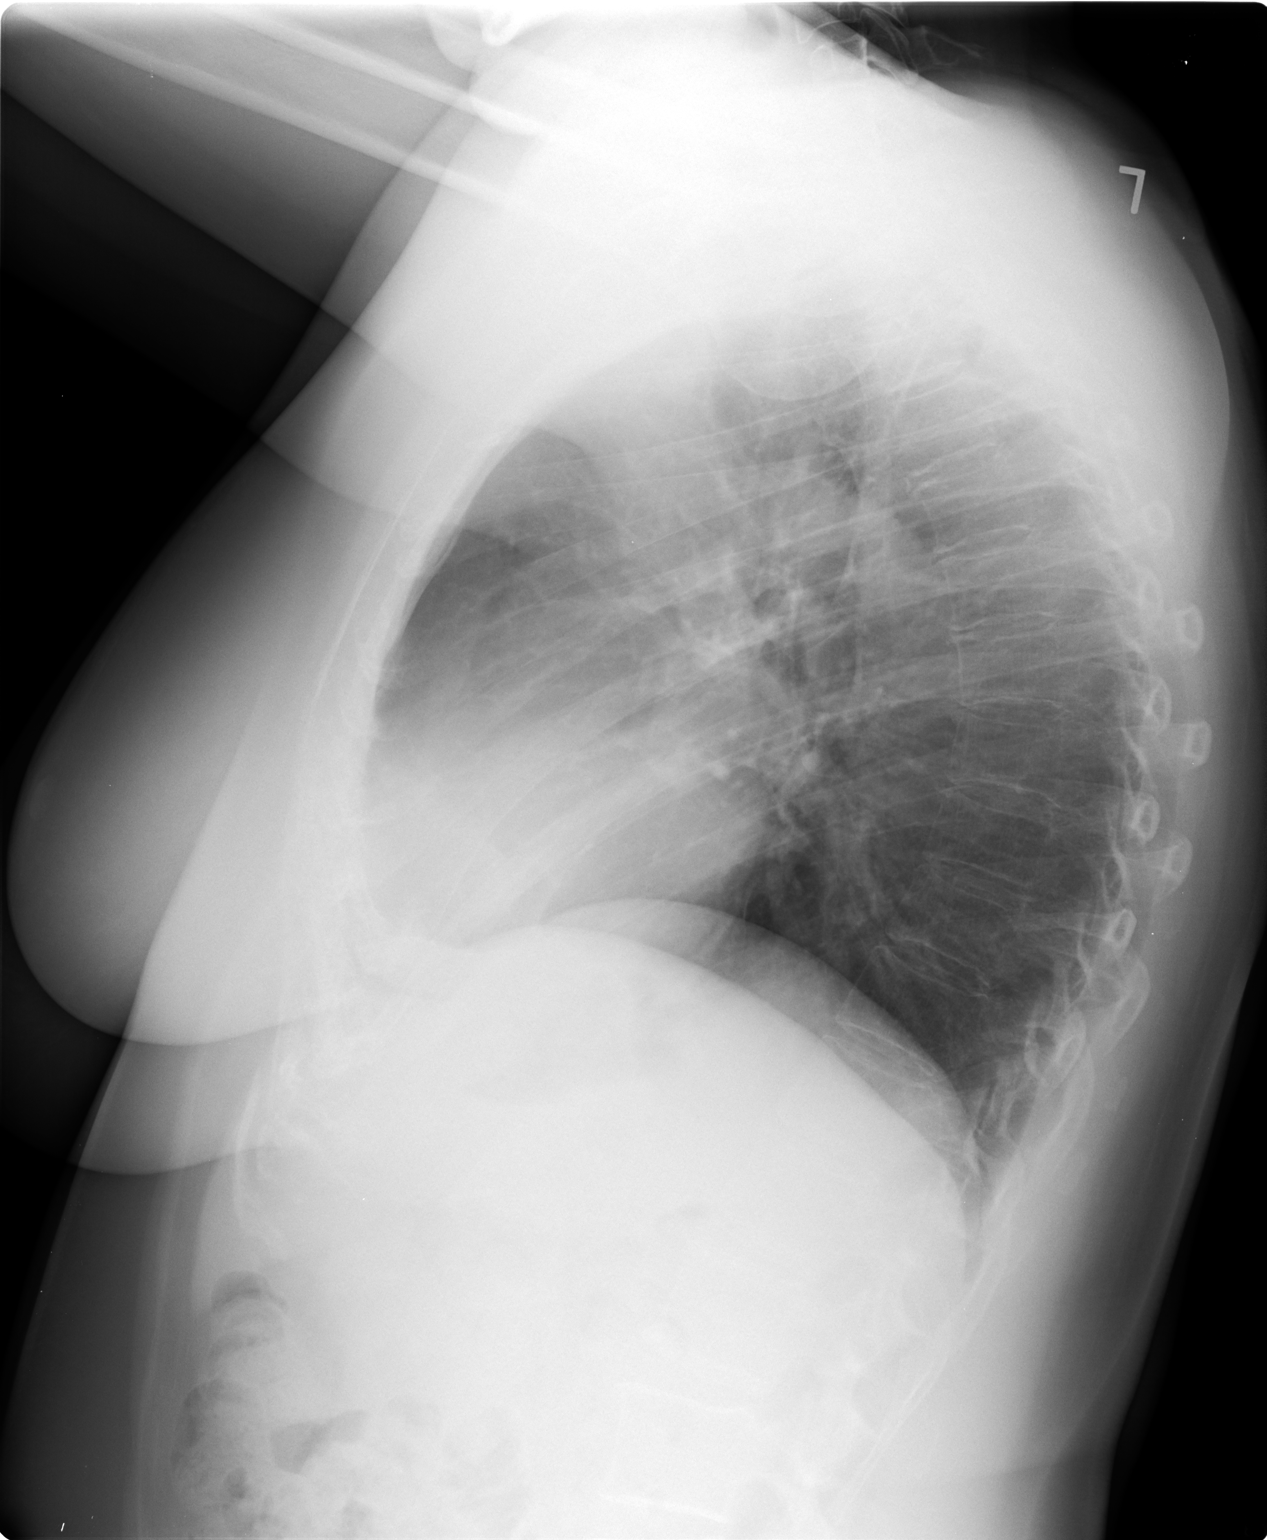

[2 of 2 positions shown; findings below may reference images not displayed]

FINDINGS: Upper normal heart size.

Mediastinal contours and pulmonary vascularity normal.

Lungs clear.

No pleural effusion or pneumothorax.

Osseous demineralization diffusely.
IMPRESSION: No acute abnormalities.

## 2017-02-13 ENCOUNTER — Other Ambulatory Visit: Payer: Self-pay | Admitting: Nurse Practitioner

## 2017-02-13 DIAGNOSIS — F329 Major depressive disorder, single episode, unspecified: Secondary | ICD-10-CM

## 2017-02-13 DIAGNOSIS — F32A Depression, unspecified: Secondary | ICD-10-CM

## 2017-03-09 ENCOUNTER — Other Ambulatory Visit: Payer: Self-pay | Admitting: Cardiovascular Disease

## 2017-03-24 ENCOUNTER — Other Ambulatory Visit: Payer: Self-pay | Admitting: *Deleted

## 2017-03-24 DIAGNOSIS — K219 Gastro-esophageal reflux disease without esophagitis: Secondary | ICD-10-CM

## 2017-03-24 MED ORDER — PANTOPRAZOLE SODIUM 40 MG PO TBEC: 40.0000 mg | DELAYED_RELEASE_TABLET | Freq: Two times a day (BID) | ORAL | 0 refills | Status: DC

## 2017-04-27 ENCOUNTER — Other Ambulatory Visit: Payer: Self-pay | Admitting: Nurse Practitioner

## 2017-04-27 DIAGNOSIS — H6690 Otitis media, unspecified, unspecified ear: Secondary | ICD-10-CM

## 2017-05-15 ENCOUNTER — Other Ambulatory Visit: Payer: Self-pay | Admitting: *Deleted

## 2017-05-15 DIAGNOSIS — F32A Depression, unspecified: Secondary | ICD-10-CM

## 2017-05-15 DIAGNOSIS — F329 Major depressive disorder, single episode, unspecified: Secondary | ICD-10-CM

## 2017-05-15 MED ORDER — BUPROPION HCL ER (XL) 300 MG PO TB24: 300.0000 mg | ORAL_TABLET | Freq: Every day | ORAL | 0 refills | Status: DC

## 2017-06-20 ENCOUNTER — Other Ambulatory Visit: Payer: Self-pay | Admitting: Nurse Practitioner

## 2017-06-20 DIAGNOSIS — K219 Gastro-esophageal reflux disease without esophagitis: Secondary | ICD-10-CM

## 2017-06-22 NOTE — Telephone Encounter (Signed)
Last seen 09/05/16  MMM

## 2017-06-23 NOTE — Telephone Encounter (Signed)
Last refill without being seen 

## 2017-07-14 ENCOUNTER — Ambulatory Visit: Payer: BLUE CROSS/BLUE SHIELD | Admitting: Cardiovascular Disease

## 2017-07-14 ENCOUNTER — Encounter: Payer: Self-pay | Admitting: Cardiovascular Disease

## 2017-07-14 ENCOUNTER — Other Ambulatory Visit: Payer: Self-pay

## 2017-07-14 VITALS — BP 123/82 | HR 78 | Ht 66.0 in | Wt 169.0 lb

## 2017-07-14 DIAGNOSIS — I10 Essential (primary) hypertension: Secondary | ICD-10-CM | POA: Diagnosis not present

## 2017-07-14 NOTE — Progress Notes (Signed)
SUBJECTIVE: The patient presents for routine follow-up.  ECG performed today which I personally reviewed demonstrates sinus rhythm with LVH and late R wave transition.  She presents today with concerns about her blood pressure.  2 weeks ago it was 152/102 when checked at work after she had a fall.  About a week ago she had been rushing and had it checked and it was 150/101.  It is 123/82 today in our office.  She checks her blood pressure at home about once per week.  She denies exertional chest pain and dyspnea.  She denies leg swelling, orthopnea, palpitations, and paroxysmal nocturnal dyspnea.  She did feel hot when her blood pressure was elevated.  She exercises for 60 to 90 minutes at a time and does the treadmill and weights.    Review of Systems: As per "subjective", otherwise negative.  Allergies  Allergen Reactions  . Hydrocodone     Norco  . Morphine And Related Nausea And Vomiting  . Tramadol Other (See Comments)    Upset stomach    Current Outpatient Medications  Medication Sig Dispense Refill  . azelastine (OPTIVAR) 0.05 % ophthalmic solution Place 1 drop into both eyes daily as needed (for allergy eye). 6 mL 12  . buPROPion (WELLBUTRIN XL) 300 MG 24 hr tablet Take 1 tablet (300 mg total) by mouth daily. 90 tablet 0  . chlorthalidone (HYGROTON) 25 MG tablet TAKE 1 TABLET BY MOUTH EVERY DAY 90 tablet 1  . fluticasone (FLONASE) 50 MCG/ACT nasal spray PLACE 2 SPRAYS INTO BOTH NOSTRILS DAILY. 16 g 2  . Multiple Vitamins-Minerals (HAIR/SKIN/NAILS PO) Take 1 tablet by mouth daily. Reported on 03/19/2015    . oxyCODONE-acetaminophen (PERCOCET/ROXICET) 5-325 MG tablet Take 1 tablet by mouth every 8 (eight) hours as needed for severe pain.    . pantoprazole (PROTONIX) 40 MG tablet TAKE 1 TABLET (40 MG TOTAL) BY MOUTH 2 (TWO) TIMES DAILY BEFORE A MEAL. 180 tablet 0   No current facility-administered medications for this visit.     Past Medical History:  Diagnosis Date    . Anxiety   . Depression   . Headache, migraine   . Hyperlipidemia   . Hypertension   . Kidney stones   . Osteoarthritis    back  . Pulmonary HTN ([place]) 12/21/2014  . S/P cardiac cath 12/20/14 normal coronary arteries 12/21/2014  . Seasonal allergies     Past Surgical History:  Procedure Laterality Date  . CARDIAC CATHETERIZATION N/A 12/20/2014   Procedure: Left Heart Cath and Coronary Angiography;  Surgeon: Sherren Mocha, MD;  Location: [facility];  Service: Cardiovascular;  Laterality: N/A;  . [place]  . lumbar disckectomy  2006  . TUBAL LIGATION  1990    Social History   Socioeconomic History  . Marital status: Married    Spouse name: Not on file  . Number of children: Not on file  . Years of education: Not on file  . Highest education level: Not on file  Occupational History  . Not on file  Social Needs  . Financial resource strain: Not on file  . Food insecurity:    Worry: Not on file    Inability: Not on file  . Transportation needs:    Medical: Not on file    Non-medical: Not on file  Tobacco Use  . Smoking status: Former Smoker    Packs/day: 1.00    Years: 10.00    Pack years: 10.00  Types: Cigarettes    Last attempt to quit: 09/18/1991    Years since quitting: 25.8  . Smokeless tobacco: Never Used  Substance and Sexual Activity  . Alcohol use: No  . Drug use: No    Comment: no IVDA  . Sexual activity: Yes  Lifestyle  . Physical activity:    Days per week: Not on file    Minutes per session: Not on file  . Stress: Not on file  Relationships  . Social connections:    Talks on phone: Not on file    Gets together: Not on file    Attends religious service: Not on file    Active member of club or organization: Not on file    Attends meetings of clubs or organizations: Not on file    Relationship status: Not on file  . Intimate partner violence:    Fear of current or ex partner: Not on file    Emotionally abused: Not  on file    Physically abused: Not on file    Forced sexual activity: Not on file  Other Topics Concern  . Not on file  Social History Narrative   Married, works as a Radiation protection practitioner.       Vitals:   07/14/17 1315  BP: 123/82  Pulse: 78  SpO2: 96%  Weight: 169 lb (76.7 kg)  Height: 5\' 6"  (1.676 m)    Wt Readings from Last 3 Encounters:  07/14/17 169 lb (76.7 kg)  01/06/17 184 lb (83.5 kg)  10/03/16 181 lb (82.1 kg)     PHYSICAL EXAM General: NAD HEENT: Normal. Neck: No JVD, no thyromegaly. Lungs: Clear to auscultation bilaterally with normal respiratory effort. CV: Regular rate and rhythm, normal S1/S2, no S3/S4, no murmur. No pretibial or periankle edema.  No carotid bruit.   Abdomen: Soft, nontender, no distention.  Neurologic: Alert and oriented.  Psych: Normal affect. Skin: Normal. Musculoskeletal: No gross deformities.    ECG: Most recent ECG reviewed.   Labs: Lab Results  Component Value Date/Time   K 4.6 09/05/2016 09:30 AM   BUN 15 09/05/2016 09:30 AM   CREATININE 0.72 09/05/2016 09:30 AM   CREATININE 0.74 03/19/2015 05:02 PM   ALT 18 09/05/2016 09:30 AM   TSH 1.330 09/05/2016 09:30 AM   HGB 12.2 09/05/2016 09:30 AM     Lipids: Lab Results  Component Value Date/Time   LDLCALC 96 09/05/2016 09:30 AM   LDLDIRECT 124 01/18/2015 04:35 PM   CHOL 200 (H) 09/05/2016 09:30 AM   TRIG 329 (H) 09/05/2016 09:30 AM   HDL 38 (L) 09/05/2016 09:30 AM       ASSESSMENT AND PLAN:  1. Hypertension:  Blood pressure is normal today but she has had isolated elevations as noted above. I have asked the patient to check blood pressure readings 3-4 times per week for the next 6 weeks, at different times throughout the day, in order to get a better approximation of mean BP values. These results will be provided to me at the end of that period so that I can determine if antihypertensive medication titration is indicated.     Disposition: Follow up 2  months   Kate Sable, M.D., F.A.C.C.

## 2017-07-14 NOTE — Patient Instructions (Signed)
Medication Instructions:  Continue all current medications.  Labwork: none  Testing/Procedures: none  Follow-Up: 2 months   Any Other Special Instructions Will Be Listed Below (If Applicable). Your physician has requested that you regularly monitor and record your blood pressure readings at home. Please take your readings 3-4 x per week for 6 weeks and return log to office for MD review.    If you need a refill on your cardiac medications before your next appointment, please call your pharmacy.

## 2017-07-18 ENCOUNTER — Other Ambulatory Visit: Payer: Self-pay | Admitting: Nurse Practitioner

## 2017-07-18 DIAGNOSIS — H6690 Otitis media, unspecified, unspecified ear: Secondary | ICD-10-CM

## 2017-08-06 ENCOUNTER — Other Ambulatory Visit: Payer: Self-pay | Admitting: Nurse Practitioner

## 2017-08-06 DIAGNOSIS — F32A Depression, unspecified: Secondary | ICD-10-CM

## 2017-08-06 DIAGNOSIS — F329 Major depressive disorder, single episode, unspecified: Secondary | ICD-10-CM

## 2017-08-14 ENCOUNTER — Other Ambulatory Visit: Payer: Self-pay | Admitting: Nurse Practitioner

## 2017-08-14 DIAGNOSIS — F32A Depression, unspecified: Secondary | ICD-10-CM

## 2017-08-14 DIAGNOSIS — F329 Major depressive disorder, single episode, unspecified: Secondary | ICD-10-CM

## 2017-08-30 ENCOUNTER — Other Ambulatory Visit: Payer: Self-pay | Admitting: Cardiovascular Disease

## 2017-09-14 ENCOUNTER — Other Ambulatory Visit: Payer: Self-pay | Admitting: Nurse Practitioner

## 2017-09-14 DIAGNOSIS — K219 Gastro-esophageal reflux disease without esophagitis: Secondary | ICD-10-CM

## 2017-09-21 ENCOUNTER — Telehealth: Payer: Self-pay | Admitting: Cardiovascular Disease

## 2017-09-21 MED ORDER — AMLODIPINE BESYLATE 5 MG PO TABS: 5.0000 mg | ORAL_TABLET | Freq: Every day | ORAL | 3 refills | Status: DC

## 2017-09-21 NOTE — Telephone Encounter (Signed)
LM on pt cell as requested (pt at work) amlodipine sent to pharmacy

## 2017-09-21 NOTE — Telephone Encounter (Signed)
Pt says for the last 2 days BP has been elevated - this morning was 165/109 - says has been between 150s-190s/100s consistently over the weekend - HR 70s-90s - c/o dizziness/headache and feeling lightheaded - denies chest pain/SOB - routed to Dr Harl Bowie with Dr Tommie Ard out of office - no extender openings this week

## 2017-09-21 NOTE — Telephone Encounter (Signed)
Patient called stating that she has been running high BP Readings all weekend. States that she is light headed, nausea.

## 2017-09-21 NOTE — Telephone Encounter (Signed)
Elevated bp's noted, from Dr Marthann Schiller last note she was having some occasional at that time. Please start norvasc 5mg  daily, have her update [place] on her bp's and symptoms next week.   Zandra Abts MD

## 2017-10-01 ENCOUNTER — Encounter: Payer: Self-pay | Admitting: Nurse Practitioner

## 2017-10-01 ENCOUNTER — Ambulatory Visit: Payer: BLUE CROSS/BLUE SHIELD | Admitting: Nurse Practitioner

## 2017-10-01 VITALS — BP 146/88 | HR 86 | Temp 97.8°F | Ht 66.0 in | Wt 168.0 lb

## 2017-10-01 DIAGNOSIS — I272 Pulmonary hypertension, unspecified: Secondary | ICD-10-CM | POA: Diagnosis not present

## 2017-10-01 DIAGNOSIS — Z6827 Body mass index (BMI) 27.0-27.9, adult: Secondary | ICD-10-CM

## 2017-10-01 DIAGNOSIS — G43019 Migraine without aura, intractable, without status migrainosus: Secondary | ICD-10-CM

## 2017-10-01 DIAGNOSIS — I10 Essential (primary) hypertension: Secondary | ICD-10-CM | POA: Diagnosis not present

## 2017-10-01 DIAGNOSIS — G8929 Other chronic pain: Secondary | ICD-10-CM

## 2017-10-01 DIAGNOSIS — F411 Generalized anxiety disorder: Secondary | ICD-10-CM

## 2017-10-01 DIAGNOSIS — M5416 Radiculopathy, lumbar region: Secondary | ICD-10-CM

## 2017-10-01 DIAGNOSIS — K219 Gastro-esophageal reflux disease without esophagitis: Secondary | ICD-10-CM | POA: Diagnosis not present

## 2017-10-01 DIAGNOSIS — F32A Depression, unspecified: Secondary | ICD-10-CM

## 2017-10-01 DIAGNOSIS — E782 Mixed hyperlipidemia: Secondary | ICD-10-CM

## 2017-10-01 DIAGNOSIS — F329 Major depressive disorder, single episode, unspecified: Secondary | ICD-10-CM

## 2017-10-01 MED ORDER — CHLORTHALIDONE 25 MG PO TABS: 25.0000 mg | ORAL_TABLET | Freq: Every day | ORAL | 1 refills | Status: DC

## 2017-10-01 MED ORDER — AMLODIPINE BESYLATE 5 MG PO TABS: 5.0000 mg | ORAL_TABLET | Freq: Every day | ORAL | 3 refills | Status: DC

## 2017-10-01 MED ORDER — BUPROPION HCL ER (XL) 300 MG PO TB24: 300.0000 mg | ORAL_TABLET | Freq: Every day | ORAL | 1 refills | Status: DC

## 2017-10-01 MED ORDER — PANTOPRAZOLE SODIUM 40 MG PO TBEC: 40.0000 mg | DELAYED_RELEASE_TABLET | Freq: Two times a day (BID) | ORAL | 0 refills | Status: DC

## 2017-10-01 NOTE — Progress Notes (Signed)
Subjective:    Patient ID: Lisa Parrish, female    DOB: [birth year], 57 y.o.   MRN: [id number]   Chief Complaint: Medical Management of chronic issues  HPI:  1. Intractable migraine without aura and without status migrainosus  Has migraine about 3x a month. Salt seems to trigger them.  2. Pulmonary HTN ([place])  She says she has occasional SOB but for th most part is doing well.  3. Essential hypertension  No c/o chest pain or headaches. Does check blood pressure at home -average around 761-950 systolic. She has appointment with cardiologist next week. BP Readings from Last 3 Encounters:  07/14/17 123/82  01/06/17 140/72  10/03/16 (!) 150/98     4. Gastroesophageal reflux disease without esophagitis  Is on protonix daily- works well to keep symptoms under control.  5. Mixed hyperlipidemia  Does not watch diet very closely and does very little exercise.  6. Depression, unspecified depression type  Takes wellburtin daily and she says she is doing well. Depression screen [facility] 2/9 10/01/2017 09/05/2016 06/05/2016  Decreased Interest 0 0 0  Down, Depressed, Hopeless 0 1 0  PHQ - 2 Score 0 1 0     7. BMI 27.0-27.9,adult  No recent weight changes  8. Anxiety state  Is not on any meds right now.  9. Lumbar radiculopathy  Patient sees neurosurogeon , DR. Carloyn Manner. She was last seen on 09/15/17 and no change was made to care. He is having her try to taper her percocet  10. Other chronic pain  Gets pain meds from Dr. Carloyn Manner wo at last visit is trying to get her to decrease use.    Outpatient Encounter Medications as of 10/01/2017  Medication Sig  . amLODipine (NORVASC) 5 MG tablet Take 1 tablet (5 mg total) by mouth daily.  Marland Kitchen azelastine (OPTIVAR) 0.05 % ophthalmic solution Place 1 drop into both eyes daily as needed (for allergy eye).  Marland Kitchen buPROPion (WELLBUTRIN XL) 300 MG 24 hr tablet TAKE 1 TABLET BY MOUTH EVERY DAY  . chlorthalidone (HYGROTON) 25 MG tablet TAKE 1 TABLET BY MOUTH EVERY DAY  .  fluticasone (FLONASE) 50 MCG/ACT nasal spray SPRAY 2 SPRAYS INTO EACH NOSTRIL EVERY DAY  . Multiple Vitamins-Minerals (HAIR/SKIN/NAILS PO) Take 1 tablet by mouth daily. Reported on 03/19/2015  . oxyCODONE-acetaminophen (PERCOCET/ROXICET) 5-325 MG tablet Take 1 tablet by mouth every 8 (eight) hours as needed for severe pain.  . pantoprazole (PROTONIX) 40 MG tablet TAKE 1 TABLET (40 MG TOTAL) BY MOUTH 2 (TWO) TIMES DAILY BEFORE A MEAL.       New complaints: None today  Social history: Works at [employer] in [employer].   Review of Systems  Constitutional: Negative for activity change and appetite change.  HENT: Negative.   Eyes: Negative for pain.  Respiratory: Negative for shortness of breath.   Cardiovascular: Negative for chest pain, palpitations and leg swelling.  Gastrointestinal: Negative for abdominal pain.  Endocrine: Negative for polydipsia.  Genitourinary: Negative.   Skin: Negative for rash.  Neurological: Negative for dizziness, weakness and headaches.  Hematological: Does not bruise/bleed easily.  Psychiatric/Behavioral: Negative.   All other systems reviewed and are negative.      Objective:   Physical Exam  Constitutional: She is oriented to person, place, and time. She appears well-developed and well-nourished. No distress.  HENT:  Head: Normocephalic.  Nose: Nose normal.  Mouth/Throat: Oropharynx is clear and moist.  Eyes: Pupils are equal, round, and reactive to light. EOM are normal.  Neck:  Normal range of motion. Neck supple. No JVD present. Carotid bruit is not present.  Cardiovascular: Normal rate, regular rhythm, normal heart sounds and intact distal pulses.  Pulmonary/Chest: Effort normal and breath sounds normal. No respiratory distress. She has no wheezes. She has no rales. She exhibits no tenderness.  Abdominal: Soft. Normal appearance, normal aorta and bowel sounds are normal. She exhibits no distension, no abdominal bruit, no pulsatile midline  mass and no mass. There is no splenomegaly or hepatomegaly. There is no tenderness.  Musculoskeletal: Normal range of motion. She exhibits no edema.  Pain on flexion of lumbar spine (-) SLR bil  Lymphadenopathy:    She has no cervical adenopathy.  Neurological: She is alert and oriented to person, place, and time. She has normal reflexes.  Skin: Skin is warm and dry.  Erythematous lateral nail bed left 1st toe.  Psychiatric: She has a normal mood and affect. Her behavior is normal. Judgment and thought content normal.  Nursing note and vitals reviewed.   BP (!) 146/88 (BP Location: Left Arm, Cuff Size: Normal)   Pulse 86   Temp 97.8 F (36.6 C) (Oral)   Ht '5\' 6"'  (1.676 m)   Wt 168 lb (76.2 kg)   BMI 27.12 kg/m         Assessment & Plan:  Camiah Humm comes in today with chief complaint of Medical Management of Chronic Issues (Wants you to look at right big toe)   Diagnosis and orders addressed:  1. Intractable migraine without aura and without status migrainosus aavoid caffeien Discussed emerge- she will let me know if she want sto start taking  2. Pulmonary HTN ([place]) Keep appointment with cardiology  3. Essential hypertension Again keep appointmnet with cardiology- let me know if does not make any changes to blood pressure meds - amLODipine (NORVASC) 5 MG tablet; Take 1 tablet (5 mg total) by mouth daily.  Dispense: 30 tablet; Refill: 3 - chlorthalidone (HYGROTON) 25 MG tablet; Take 1 tablet (25 mg total) by mouth daily.  Dispense: 90 tablet; Refill: 1 - CMP14+EGFR  4. Gastroesophageal reflux disease without esophagitis Avoid spicy foods Do not eat 2 hours prior to bedtime - pantoprazole (PROTONIX) 40 MG tablet; Take 1 tablet (40 mg total) by mouth 2 (two) times daily before a meal.  Dispense: 180 tablet; Refill: 0  5. Mixed hyperlipidemia Low aft diet - Lipid panel  6. Depression, unspecified depression type Stress management - buPROPion (WELLBUTRIN  XL) 300 MG 24 hr tablet; Take 1 tablet (300 mg total) by mouth daily.  Dispense: 90 tablet; Refill: 1  7. BMI 27.0-27.9,adult Discussed diet and exercise for person with BMI >25 Will recheck weight in 3-6 months  8. Anxiety state  9. Lumbar radiculopathy Keep follow up with DR. roy  10. Other chronic pain   Labs pending Health Maintenance reviewed Diet and exercise encouraged  Follow up plan: 3 months   Mary-Margaret Hassell Done, FNP

## 2017-10-01 NOTE — Patient Instructions (Signed)
DASH Eating Plan DASH stands for "Dietary Approaches to Stop Hypertension." The DASH eating plan is a healthy eating plan that has been shown to reduce high blood pressure (hypertension). It may also reduce your risk for type 2 diabetes, heart disease, and stroke. The DASH eating plan may also help with weight loss. What are tips for following this plan? General guidelines  Avoid eating more than 2,300 mg (milligrams) of salt (sodium) a day. If you have hypertension, you may need to reduce your sodium intake to 1,500 mg a day.  Limit alcohol intake to no more than 1 drink a day for nonpregnant women and 2 drinks a day for men. One drink equals 12 oz of beer, 5 oz of wine, or 1 oz of hard liquor.  Work with your health care provider to maintain a healthy body weight or to lose weight. Ask what an ideal weight is for you.  Get at least 30 minutes of exercise that causes your heart to beat faster (aerobic exercise) most days of the week. Activities may include walking, swimming, or biking.  Work with your health care provider or diet and nutrition specialist (dietitian) to adjust your eating plan to your individual calorie needs. Reading food labels  Check food labels for the amount of sodium per serving. Choose foods with less than 5 percent of the Daily Value of sodium. Generally, foods with less than 300 mg of sodium per serving fit into this eating plan.  To find whole grains, look for the word "whole" as the first word in the ingredient list. Shopping  Buy products labeled as "low-sodium" or "no salt added."  Buy fresh foods. Avoid canned foods and premade or frozen meals. Cooking  Avoid adding salt when cooking. Use salt-free seasonings or herbs instead of table salt or sea salt. Check with your health care provider or pharmacist before using salt substitutes.  Do not fry foods. Cook foods using healthy methods such as baking, boiling, grilling, and broiling instead.  Cook with  heart-healthy oils, such as olive, canola, soybean, or sunflower oil. Meal planning   Eat a balanced diet that includes: ? 5 or more servings of fruits and vegetables each day. At each meal, try to fill half of your plate with fruits and vegetables. ? Up to 6-8 servings of whole grains each day. ? Less than 6 oz of lean meat, poultry, or fish each day. A 3-oz serving of meat is about the same size as a deck of cards. One egg equals 1 oz. ? 2 servings of low-fat dairy each day. ? A serving of nuts, seeds, or beans 5 times each week. ? Heart-healthy fats. Healthy fats called Omega-3 fatty acids are found in foods such as flaxseeds and coldwater fish, like sardines, salmon, and mackerel.  Limit how much you eat of the following: ? Canned or prepackaged foods. ? Food that is high in trans fat, such as fried foods. ? Food that is high in saturated fat, such as fatty meat. ? Sweets, desserts, sugary drinks, and other foods with added sugar. ? Full-fat dairy products.  Do not salt foods before eating.  Try to eat at least 2 vegetarian meals each week.  Eat more home-cooked food and less restaurant, buffet, and fast food.  When eating at a restaurant, ask that your food be prepared with less salt or no salt, if possible. What foods are recommended? The items listed may not be a complete list. Talk with your dietitian about what   dietary choices are best for you. Grains Whole-grain or whole-wheat bread. Whole-grain or whole-wheat pasta. Brown rice. Oatmeal. Quinoa. Bulgur. Whole-grain and low-sodium cereals. Pita bread. Low-fat, low-sodium crackers. Whole-wheat flour tortillas. Vegetables Fresh or frozen vegetables (raw, steamed, roasted, or grilled). Low-sodium or reduced-sodium tomato and vegetable juice. Low-sodium or reduced-sodium tomato sauce and tomato paste. Low-sodium or reduced-sodium canned vegetables. Fruits All fresh, dried, or frozen fruit. Canned fruit in natural juice (without  added sugar). Meat and other protein foods Skinless chicken or turkey. Ground chicken or turkey. Pork with fat trimmed off. Fish and seafood. Egg whites. Dried beans, peas, or lentils. Unsalted nuts, nut butters, and seeds. Unsalted canned beans. Lean cuts of beef with fat trimmed off. Low-sodium, lean deli meat. Dairy Low-fat (1%) or fat-free (skim) milk. Fat-free, low-fat, or reduced-fat cheeses. Nonfat, low-sodium ricotta or cottage cheese. Low-fat or nonfat yogurt. Low-fat, low-sodium cheese. Fats and oils Soft margarine without trans fats. Vegetable oil. Low-fat, reduced-fat, or light mayonnaise and salad dressings (reduced-sodium). Canola, safflower, olive, soybean, and sunflower oils. Avocado. Seasoning and other foods Herbs. Spices. Seasoning mixes without salt. Unsalted popcorn and pretzels. Fat-free sweets. What foods are not recommended? The items listed may not be a complete list. Talk with your dietitian about what dietary choices are best for you. Grains Baked goods made with fat, such as croissants, muffins, or some breads. Dry pasta or rice meal packs. Vegetables Creamed or fried vegetables. Vegetables in a cheese sauce. Regular canned vegetables (not low-sodium or reduced-sodium). Regular canned tomato sauce and paste (not low-sodium or reduced-sodium). Regular tomato and vegetable juice (not low-sodium or reduced-sodium). Pickles. Olives. Fruits Canned fruit in a light or heavy syrup. Fried fruit. Fruit in cream or butter sauce. Meat and other protein foods Fatty cuts of meat. Ribs. Fried meat. Bacon. Sausage. Bologna and other processed lunch meats. Salami. Fatback. Hotdogs. Bratwurst. Salted nuts and seeds. Canned beans with added salt. Canned or smoked fish. Whole eggs or egg yolks. Chicken or turkey with skin. Dairy Whole or 2% milk, cream, and half-and-half. Whole or full-fat cream cheese. Whole-fat or sweetened yogurt. Full-fat cheese. Nondairy creamers. Whipped toppings.  Processed cheese and cheese spreads. Fats and oils Butter. Stick margarine. Lard. Shortening. Ghee. Bacon fat. Tropical oils, such as coconut, palm kernel, or palm oil. Seasoning and other foods Salted popcorn and pretzels. Onion salt, garlic salt, seasoned salt, table salt, and sea salt. Worcestershire sauce. Tartar sauce. Barbecue sauce. Teriyaki sauce. Soy sauce, including reduced-sodium. Steak sauce. Canned and packaged gravies. Fish sauce. Oyster sauce. Cocktail sauce. Horseradish that you find on the shelf. Ketchup. Mustard. Meat flavorings and tenderizers. Bouillon cubes. Hot sauce and Tabasco sauce. Premade or packaged marinades. Premade or packaged taco seasonings. Relishes. Regular salad dressings. Where to find more information:  National Heart, Lung, and Blood Institute: www.nhlbi.nih.gov  American Heart Association: www.heart.org Summary  The DASH eating plan is a healthy eating plan that has been shown to reduce high blood pressure (hypertension). It may also reduce your risk for type 2 diabetes, heart disease, and stroke.  With the DASH eating plan, you should limit salt (sodium) intake to 2,300 mg a day. If you have hypertension, you may need to reduce your sodium intake to 1,500 mg a day.  When on the DASH eating plan, aim to eat more fresh fruits and vegetables, whole grains, lean proteins, low-fat dairy, and heart-healthy fats.  Work with your health care provider or diet and nutrition specialist (dietitian) to adjust your eating plan to your individual   calorie needs. This information is not intended to replace advice given to you by your health care provider. Make sure you discuss any questions you have with your health care provider. Document Released: 02/06/2011 Document Revised: 02/11/2016 Document Reviewed: 02/11/2016 [employer] Interactive Patient Education  2018 [employer]  

## 2017-10-02 ENCOUNTER — Other Ambulatory Visit: Payer: Self-pay | Admitting: Nurse Practitioner

## 2017-10-02 LAB — LIPID PANEL
Chol/HDL Ratio: 4.9 ratio — ABNORMAL HIGH (ref 0.0–4.4)
Cholesterol, Total: 234 mg/dL — ABNORMAL HIGH (ref 100–199)
HDL: 48 mg/dL (ref >39)
LDL Calculated: 140 mg/dL — ABNORMAL HIGH (ref 0–99)
Triglycerides: 229 mg/dL — ABNORMAL HIGH (ref 0–149)
VLDL Cholesterol Cal: 46 mg/dL — ABNORMAL HIGH (ref 5–40)

## 2017-10-02 LAB — CMP14+EGFR
ALT: 15 IU/L (ref 0–32)
AST: 19 IU/L (ref 0–40)
Albumin/Globulin Ratio: 1.8 (ref 1.2–2.2)
Albumin: 4.8 g/dL (ref 3.5–5.5)
Alkaline Phosphatase: 89 IU/L (ref 39–117)
BUN/Creatinine Ratio: 19 (ref 9–23)
BUN: 14 mg/dL (ref 6–24)
Bilirubin Total: 0.2 mg/dL (ref 0.0–1.2)
CO2: 27 mmol/L (ref 20–29)
Calcium: 10 mg/dL (ref 8.7–10.2)
Chloride: 98 mmol/L (ref 96–106)
Creatinine, Ser: 0.72 mg/dL (ref 0.57–1.00)
GFR calc Af Amer: 108 mL/min/{1.73_m2} (ref >59)
GFR calc non Af Amer: 94 mL/min/{1.73_m2} (ref >59)
Globulin, Total: 2.6 g/dL (ref 1.5–4.5)
Glucose: 113 mg/dL — ABNORMAL HIGH (ref 65–99)
Potassium: 3 mmol/L — ABNORMAL LOW (ref 3.5–5.2)
Sodium: 142 mmol/L (ref 134–144)
Total Protein: 7.4 g/dL (ref 6.0–8.5)

## 2017-10-02 MED ORDER — POTASSIUM CHLORIDE ER 10 MEQ PO TBCR: 10.0000 meq | EXTENDED_RELEASE_TABLET | Freq: Every day | ORAL | 5 refills | Status: DC

## 2017-10-08 ENCOUNTER — Ambulatory Visit: Payer: BLUE CROSS/BLUE SHIELD | Admitting: Cardiovascular Disease

## 2017-10-09 ENCOUNTER — Ambulatory Visit: Payer: BLUE CROSS/BLUE SHIELD | Admitting: Cardiovascular Disease

## 2017-10-12 NOTE — Progress Notes (Signed)
Cardiology Office Note    Date:  10/13/2017   ID:  Lisa Parrish, DOB 1960/03/08, MRN [id number]  PCP:  Chevis Pretty, FNP  Cardiologist: Kate Sable, MD    Chief Complaint  Patient presents with  . Follow-up    3 month visit    History of Present Illness:    Lisa Parrish is a 57 y.o. female with past medical history of HTN, HLD, and normal cors by cardiac catheterization in 2016 who presents to the office today for 71-month follow-up.   She was last examined by Dr. Bronson Ing in 07/2017 and reported episodes of elevated BP in the ambulatory setting, well-controlled at 123/82 when checked in the office. She denied any recent chest pain or dyspnea on exertion and was exercising for 60-90 minute intervals without any anginal symptoms. Was encouraged to continue to follow BP in the ambulatory setting and report back with her readings.   She called the office on 09/21/2017 and reported episodes of elevated BP with associated dizziness and headaches. Was advised to start Amlodipine 5mg  daily at that time.   In talking with the patient today, she reports variable BP readings over the past several months. Since being started on Amlodipine and purchasing a new blood pressure cuff, she has recorded variable readings with SBP ranging from the 90's to 160's. Reports associated headaches when BP is elevated and fatigue when BP is soft. She works a full-time job in [place] which requires her to wake up at 4 AM and upon completing her shift, she goes to [employer] where she exercises for over an hour. She typically notices her blood pressure readings are elevated upon returning home which is right after she has exercised. Her softer BP readings have been later in the evening close to 7:30 PM or later.   Denies any recent chest pain, dyspnea on exertion, orthopnea, PND, or lower extremity edema. No noted side effects to being started on Amlodipine.   Past Medical  History:  Diagnosis Date  . Anxiety   . Depression   . Headache, migraine   . Hyperlipidemia   . Hypertension   . Kidney stones   . Osteoarthritis    back  . Pulmonary HTN ([place]) 12/21/2014  . S/P cardiac cath 12/20/14 normal coronary arteries 12/21/2014  . Seasonal allergies     Past Surgical History:  Procedure Laterality Date  . CARDIAC CATHETERIZATION N/A 12/20/2014   Procedure: Left Heart Cath and Coronary Angiography;  Surgeon: Sherren Mocha, MD;  Location: [facility];  Service: Cardiovascular;  Laterality: N/A;  . [place]  . lumbar disckectomy  2006  . TUBAL LIGATION  1990    Current Medications: Outpatient Medications Prior to Visit  Medication Sig Dispense Refill  . amLODipine (NORVASC) 5 MG tablet Take 1 tablet (5 mg total) by mouth daily. 30 tablet 3  . azelastine (OPTIVAR) 0.05 % ophthalmic solution Place 1 drop into both eyes daily as needed (for allergy eye). 6 mL 12  . buPROPion (WELLBUTRIN XL) 300 MG 24 hr tablet Take 1 tablet (300 mg total) by mouth daily. 90 tablet 1  . fluticasone (FLONASE) 50 MCG/ACT nasal spray SPRAY 2 SPRAYS INTO EACH NOSTRIL EVERY DAY 48 g 0  . Multiple Vitamins-Minerals (HAIR/SKIN/NAILS PO) Take 1 tablet by mouth daily. Reported on 03/19/2015    . oxyCODONE-acetaminophen (PERCOCET/ROXICET) 5-325 MG tablet Take 1 tablet by mouth every 8 (eight) hours as needed for severe pain.    . pantoprazole (PROTONIX)  40 MG tablet Take 1 tablet (40 mg total) by mouth 2 (two) times daily before a meal. 180 tablet 0  . tiZANidine (ZANAFLEX) 4 MG tablet TAKE 1 TABLET (4 MG TOTAL) BY MOUTH EVERY EIGHT (8) HOURS AS NEEDED.  1  . chlorthalidone (HYGROTON) 25 MG tablet Take 1 tablet (25 mg total) by mouth daily. 90 tablet 1  . potassium chloride (K-DUR) 10 MEQ tablet Take 1 tablet (10 mEq total) by mouth daily. (Patient not taking: Reported on 10/13/2017) 30 tablet 5   No facility-administered medications prior to visit.       Allergies:   Hydrocodone; Morphine and related; and Tramadol   Social History   Socioeconomic History  . Marital status: Married    Spouse name: Not on file  . Number of children: Not on file  . Years of education: Not on file  . Highest education level: Not on file  Occupational History  . Not on file  Social Needs  . Financial resource strain: Not on file  . Food insecurity:    Worry: Not on file    Inability: Not on file  . Transportation needs:    Medical: Not on file    Non-medical: Not on file  Tobacco Use  . Smoking status: Former Smoker    Packs/day: 1.00    Years: 10.00    Pack years: 10.00    Types: Cigarettes    Last attempt to quit: 09/18/1991    Years since quitting: 26.0  . Smokeless tobacco: Never Used  Substance and Sexual Activity  . Alcohol use: No  . Drug use: No    Comment: no IVDA  . Sexual activity: Yes  Lifestyle  . Physical activity:    Days per week: Not on file    Minutes per session: Not on file  . Stress: Not on file  Relationships  . Social connections:    Talks on phone: Not on file    Gets together: Not on file    Attends religious service: Not on file    Active member of club or organization: Not on file    Attends meetings of clubs or organizations: Not on file    Relationship status: Not on file  Other Topics Concern  . Not on file  Social History Narrative   Married, works as a Radiation protection practitioner.       Family History:  The patient's family history includes Cancer in her mother; Colon cancer (age of onset: 19) in her paternal aunt and paternal uncle; Heart disease in her father.   Review of Systems:   Please see the history of present illness.     General:  No chills, fever, night sweats or weight changes.  Cardiovascular:  No chest pain, dyspnea on exertion, edema, orthopnea, palpitations, paroxysmal nocturnal dyspnea. Dermatological: No rash, lesions/masses Respiratory: No cough, dyspnea Urologic: No  hematuria, dysuria Abdominal:   No nausea, vomiting, diarrhea, bright red blood per rectum, melena, or hematemesis Neurologic:  No visual changes, wkns, changes in mental status. Positive for headaches.   All other systems reviewed and are otherwise negative except as noted above.   Physical Exam:    VS:  BP (!) 156/92   Pulse 78   Ht 5\' 6"  (1.676 m)   Wt 169 lb 3.2 oz (76.7 kg)   SpO2 98% Comment: on room air  BMI 27.31 kg/m    General: Well developed, well nourished Caucasian female appearing in no acute distress. Head: Normocephalic,  atraumatic, sclera non-icteric, no xanthomas, nares are without discharge.  Neck: No carotid bruits. JVD not elevated.  Lungs: Respirations regular and unlabored, without wheezes or rales.  Heart: Regular rate and rhythm. No S3 or S4.  No murmur, no rubs, or gallops appreciated. Abdomen: Soft, non-tender, non-distended with normoactive bowel sounds. No hepatomegaly. No rebound/guarding. No obvious abdominal masses. Msk:  Strength and tone appear normal for age. No joint deformities or effusions. Extremities: No clubbing or cyanosis. No lower extremity edema.  Distal pedal pulses are 2+ bilaterally. Neuro: Alert and oriented X 3. Moves all extremities spontaneously. No focal deficits noted. Psych:  Responds to questions appropriately with a normal affect. Skin: No rashes or lesions noted  Wt Readings from Last 3 Encounters:  10/13/17 169 lb 3.2 oz (76.7 kg)  10/01/17 168 lb (76.2 kg)  07/14/17 169 lb (76.7 kg)     Studies/Labs Reviewed:   EKG:  EKG is not ordered today.   Recent Labs: 10/01/2017: ALT 15; BUN 14; Creatinine, Ser 0.72; Potassium 3.0; Sodium 142   Lipid Panel    Component Value Date/Time   CHOL 234 (H) 10/01/2017 1613   TRIG 229 (H) 10/01/2017 1613   HDL 48 10/01/2017 1613   CHOLHDL 4.9 (H) 10/01/2017 1613   CHOLHDL 3.7 02/27/2015 1506   VLDL 50 (H) 02/27/2015 1506   LDLCALC 140 (H) 10/01/2017 1613   LDLDIRECT 124  01/18/2015 1635    Additional studies/ records that were reviewed today include:   Echocardiogram: 10/08/2016 Study Conclusions  - Left ventricle: The cavity size was normal. Wall thickness was   increased in a pattern of mild LVH. Systolic function was   vigorous. The estimated ejection fraction was in the range of 65%   to 70%. Wall motion was normal; there were no regional wall   motion abnormalities. Doppler parameters are consistent with   abnormal left ventricular relaxation (grade 1 diastolic   dysfunction). - Aortic valve: Mildly calcified annulus. Normal thickness   leaflets. Valve area (VTI): 3.11 cm^2. Valve area (Vmax): 2.67   cm^2. Valve area (Vmean): 2.43 cm^2. - Technically adequate study.  Cardiac Catheterization: 12/2014 1. Angiographically normal coronary arteries 2. Normal LV systolic function  Suspect noncardiac chest pain. Anticipate discharge home tomorrow am.   Assessment:    1. Essential hypertension   2. Hyperlipidemia, unspecified hyperlipidemia type   3. Hypokalemia      Plan:   In order of problems listed above:  1. HTN - BP has been variable over the past several months when checked in the office and in the ambulatory setting.  SBP ranging from the 90's to 160's after being started on Amlodipine. Most of her elevated readings are in the afternoon hours and I imagine having just finished exercising and traveling home from [place] are contributing to this. Typically has SBP in the 90's to 110's when checked at 7:30 PM or later. She is currently taking Amlodipine 5 mg at 0400 and Chlorthalidone 25 mg daily at 1600. I recommended we divide out her Chlorthalidone dosing to 12.5mg  in AM and 12.5mg  in PM to hopefully decrease her BP fluctuations in the evening hours. May need to further titrate Amlodipine to 10mg  daily but will not titrate at this time given her intermittent SBP in the 90's. She will continue to follow BP in the ambulatory setting  and report back with results.  2. HLD - Recent FLP showed total cholesterol 234, triglycerides 229, HDL 48, and LDL 140. This is followed by  her PCP. She is not currently on statin therapy. Planning for recheck labs in 3 months following dietary changes.  3. Hypokalemia - K+ was low at 3.0 when checked by her PCP on 10/01/2017. She was prescribed K-dur 10 mEq daily but has not yet started this due to not wanting to take another medication. We reviewed ways to increase potassium intake in her diet and she was provided with this information on her AVS today. Will recheck in a month following dietary changes.   Medication Adjustments/Labs and Tests Ordered: Current medicines are reviewed at length with the patient today.  Concerns regarding medicines are outlined above.  Medication changes, Labs and Tests ordered today are listed in the Patient Instructions below. Patient Instructions   Medication Instructions:   Your physician has recommended you make the following change in your medication:   Change chlorthalidone to 12.5 mg by mouth twice daily.  Continue all other medications the same.  Labwork:  Your physician recommends that you return for lab work in: 4 weeks to check your BMET.  Testing/Procedures:  NONE  Follow-Up:  Your physician recommends that you schedule a follow-up appointment in:   Any Other Special Instructions Will Be Listed Below (If Applicable).  If you need a refill on your cardiac medications before your next appointment, please call your pharmacy.   Potassium Content of Foods Potassium is a mineral found in many foods and drinks. It helps keep fluids and minerals balanced in your body and affects how steadily your heart beats. Potassium also helps control your blood pressure and keep your muscles and nervous system healthy. Certain health conditions and medicines may change the balance of potassium in your body. When this happens, you can help balance your  level of potassium through the foods that you do or do not eat. Your health care provider or dietitian may recommend an amount of potassium that you should have each day. The following lists of foods provide the amount of potassium (in parentheses) per serving in each item. High in potassium The following foods and beverages have 200 mg or more of potassium per serving:  Apricots, 2 raw or 5 dry (200 mg).  Artichoke, 1 medium (345 mg).  Avocado, raw,  each (245 mg).  Banana, 1 medium (425 mg).  Beans, lima, or baked beans, canned,  cup (280 mg).  Beans, white, canned,  cup (595 mg).  Beef roast, 3 oz (320 mg).  Beef, ground, 3 oz (270 mg).  Beets, raw or cooked,  cup (260 mg).  Bran muffin, 2 oz (300 mg).  Broccoli,  cup (230 mg).  Brussels sprouts,  cup (250 mg).  Cantaloupe,  cup (215 mg).  Cereal, 100% bran,  cup (200-400 mg).  Cheeseburger, single, fast food, 1 each (225-400 mg).  Chicken, 3 oz (220 mg).  Clams, canned, 3 oz (535 mg).  Crab, 3 oz (225 mg).  Dates, 5 each (270 mg).  Dried beans and peas,  cup (300-475 mg).  Figs, dried, 2 each (260 mg).  Fish: halibut, tuna, cod, snapper, 3 oz (480 mg).  Fish: salmon, haddock, swordfish, perch, 3 oz (300 mg).  Fish, tuna, canned 3 oz (200 mg).  [place] fries, fast food, 3 oz (470 mg).  Granola with fruit and nuts,  cup (200 mg).  Grapefruit juice,  cup (200 mg).  Greens, beet,  cup (655 mg).  Honeydew melon,  cup (200 mg).  Kale, raw, 1 cup (300 mg).  Kiwi, 1 medium (240 mg).  Kohlrabi, rutabaga, parsnips,  cup (280 mg).  Lentils,  cup (365 mg).  Mango, 1 each (325 mg).  Milk, chocolate, 1 cup (420 mg).  Milk: nonfat, low-fat, whole, buttermilk, 1 cup (350-380 mg).  Molasses, 1 Tbsp (295 mg).  Mushrooms,  cup (280) mg.  Nectarine, 1 each (275 mg).  Nuts: almonds, peanuts, hazelnuts, [place], cashew, mixed, 1 oz (200 mg).  Nuts, pistachios, 1 oz (295 mg).  Orange, 1  each (240 mg).  Orange juice,  cup (235 mg).  Papaya, medium,  fruit (390 mg).  Peanut butter, chunky, 2 Tbsp (240 mg).  Peanut butter, smooth, 2 Tbsp (210 mg).  Pear, 1 medium (200 mg).  Pomegranate, 1 whole (400 mg).  Pomegranate juice,  cup (215 mg).  Pork, 3 oz (350 mg).  Potato chips, salted, 1 oz (465 mg).  Potato, baked with skin, 1 medium (925 mg).  Potatoes, boiled,  cup (255 mg).  Potatoes, mashed,  cup (330 mg).  Prune juice,  cup (370 mg).  Prunes, 5 each (305 mg).  Pudding, chocolate,  cup (230 mg).  Pumpkin, canned,  cup (250 mg).  Raisins, seedless,  cup (270 mg).  Seeds, sunflower or pumpkin, 1 oz (240 mg).  Soy milk, 1 cup (300 mg).  Spinach,  cup (420 mg).  Spinach, canned,  cup (370 mg).  Sweet potato, baked with skin, 1 medium (450 mg).  Swiss chard,  cup (480 mg).  Tomato or vegetable juice,  cup (275 mg).  Tomato sauce or puree,  cup (400-550 mg).  Tomato, raw, 1 medium (290 mg).  Tomatoes, canned,  cup (200-300 mg).  [place], 3 oz (250 mg).  Wheat germ, 1 oz (250 mg).  Winter squash,  cup (250 mg).  Yogurt, plain or fruited, 6 oz (260-435 mg).  Zucchini,  cup (220 mg).  Moderate in potassium The following foods and beverages have 50-200 mg of potassium per serving:  Apple, 1 each (150 mg).  Apple juice,  cup (150 mg).  Applesauce,  cup (90 mg).  Apricot nectar,  cup (140 mg).  Asparagus, small spears,  cup or 6 spears (155 mg).  Bagel, cinnamon raisin, 1 each (130 mg).  Bagel, egg or plain, 4 in., 1 each (70 mg).  Beans, green,  cup (90 mg).  Beans, yellow,  cup (190 mg).  Beer, regular, 12 oz (100 mg).  Beets, canned,  cup (125 mg).  Blackberries,  cup (115 mg).  Blueberries,  cup (60 mg).  Bread, whole wheat, 1 slice (70 mg).  Broccoli, raw,  cup (145 mg).  Cabbage,  cup (150 mg).  Carrots, cooked or raw,  cup (180 mg).  Cauliflower, raw,  cup (150  mg).  Celery, raw,  cup (155 mg).  Cereal, bran flakes, cup (120-150 mg).  Cheese, cottage,  cup (110 mg).  Cherries, 10 each (150 mg).  Chocolate, 1 oz bar (165 mg).  Coffee, brewed 6 oz (90 mg).  Corn,  cup or 1 ear (195 mg).  Cucumbers,  cup (80 mg).  Egg, large, 1 each (60 mg).  Eggplant,  cup (60 mg).  Endive, raw, cup (80 mg).  English muffin, 1 each (65 mg).  Fish, orange roughy, 3 oz (150 mg).  Frankfurter, beef or pork, 1 each (75 mg).  Fruit cocktail,  cup (115 mg).  Grape juice,  cup (170 mg).  Grapefruit,  fruit (175 mg).  Grapes,  cup (155 mg).  Greens: kale, turnip, collard,  cup (110-150 mg).  Ice cream  or frozen yogurt, chocolate,  cup (175 mg).  Ice cream or frozen yogurt, vanilla,  cup (120-150 mg).  Lemons, limes, 1 each (80 mg).  Lettuce, all types, 1 cup (100 mg).  Mixed vegetables,  cup (150 mg).  Mushrooms, raw,  cup (110 mg).  Nuts: walnuts, pecans, or macadamia, 1 oz (125 mg).  Oatmeal,  cup (80 mg).  Okra,  cup (110 mg).  Onions, raw,  cup (120 mg).  Peach, 1 each (185 mg).  Peaches, canned,  cup (120 mg).  Pears, canned,  cup (120 mg).  Peas, green, frozen,  cup (90 mg).  Peppers, green,  cup (130 mg).  Peppers, red,  cup (160 mg).  Pineapple juice,  cup (165 mg).  Pineapple, fresh or canned,  cup (100 mg).  Plums, 1 each (105 mg).  Pudding, vanilla,  cup (150 mg).  Raspberries,  cup (90 mg).  Rhubarb,  cup (115 mg).  Rice, wild,  cup (80 mg).  Shrimp, 3 oz (155 mg).  Spinach, raw, 1 cup (170 mg).  Strawberries,  cup (125 mg).  Summer squash  cup (175-200 mg).  Swiss chard, raw, 1 cup (135 mg).  Tangerines, 1 each (140 mg).  Tea, brewed, 6 oz (65 mg).  Turnips,  cup (140 mg).  Watermelon,  cup (85 mg).  Wine, red, table, 5 oz (180 mg).  Wine, white, table, 5 oz (100 mg).  Low in potassium The following foods and beverages have less than 50 mg of  potassium per serving.  Bread, white, 1 slice (30 mg).  Carbonated beverages, 12 oz (less than 5 mg).  Cheese, 1 oz (20-30 mg).  Cranberries,  cup (45 mg).  Cranberry juice cocktail,  cup (20 mg).  Fats and oils, 1 Tbsp (less than 5 mg).  Hummus, 1 Tbsp (32 mg).  Nectar: papaya, mango, or pear,  cup (35 mg).  Rice, white or brown,  cup (50 mg).  Spaghetti or macaroni,  cup cooked (30 mg).  Tortilla, flour or corn, 1 each (50 mg).  Waffle, 4 in., 1 each (50 mg).  Water chestnuts,  cup (40 mg).  This information is not intended to replace advice given to you by your health care provider. Make sure you discuss any questions you have with your health care provider. Document Released: 10/01/2004 Document Revised: 07/26/2015 Document Reviewed: 01/14/2013 Elsevier Interactive Patient Education  2018 [place], Erma Heritage, [place]  10/13/2017 5:02 PM    [facility] [address] [place], [place] [address] Phone: [phone]

## 2017-10-13 ENCOUNTER — Encounter: Payer: Self-pay | Admitting: Student

## 2017-10-13 ENCOUNTER — Ambulatory Visit: Payer: BLUE CROSS/BLUE SHIELD | Admitting: Student

## 2017-10-13 VITALS — BP 156/92 | HR 78 | Ht 66.0 in | Wt 169.2 lb

## 2017-10-13 DIAGNOSIS — E876 Hypokalemia: Secondary | ICD-10-CM | POA: Diagnosis not present

## 2017-10-13 DIAGNOSIS — E785 Hyperlipidemia, unspecified: Secondary | ICD-10-CM

## 2017-10-13 DIAGNOSIS — I10 Essential (primary) hypertension: Secondary | ICD-10-CM | POA: Diagnosis not present

## 2017-10-13 MED ORDER — CHLORTHALIDONE 25 MG PO TABS: 12.5000 mg | ORAL_TABLET | Freq: Two times a day (BID) | ORAL | 1 refills | Status: DC

## 2017-10-13 NOTE — Patient Instructions (Addendum)
Medication Instructions:   Your physician has recommended you make the following change in your medication:   Change chlorthalidone to 12.5 mg by mouth twice daily.  Continue all other medications the same.  Labwork:  Your physician recommends that you return for lab work in: 4 weeks to check your BMET.  Testing/Procedures:  NONE  Follow-Up:  Your physician recommends that you schedule a follow-up appointment in: 4-6 weeks with Lisa Parrish or Lisa Parrish.  Any Other Special Instructions Will Be Listed Below (If Applicable).  If you need a refill on your cardiac medications before your next appointment, please call your pharmacy.   Potassium Content of Foods Potassium is a mineral found in many foods and drinks. It helps keep fluids and minerals balanced in your body and affects how steadily your heart beats. Potassium also helps control your blood pressure and keep your muscles and nervous system healthy. Certain health conditions and medicines may change the balance of potassium in your body. When this happens, you can help balance your level of potassium through the foods that you do or do not eat. Your health care provider or dietitian may recommend an amount of potassium that you should have each day. The following lists of foods provide the amount of potassium (in parentheses) per serving in each item. High in potassium The following foods and beverages have 200 mg or more of potassium per serving:  Apricots, 2 raw or 5 dry (200 mg).  Artichoke, 1 medium (345 mg).  Avocado, raw,  each (245 mg).  Banana, 1 medium (425 mg).  Beans, lima, or baked beans, canned,  cup (280 mg).  Beans, white, canned,  cup (595 mg).  Beef roast, 3 oz (320 mg).  Beef, ground, 3 oz (270 mg).  Beets, raw or cooked,  cup (260 mg).  Bran muffin, 2 oz (300 mg).  Broccoli,  cup (230 mg).  Brussels sprouts,  cup (250 mg).  Cantaloupe,  cup (215 mg).  Cereal, 100% bran,  cup (200-400  mg).  Cheeseburger, single, fast food, 1 each (225-400 mg).  Chicken, 3 oz (220 mg).  Clams, canned, 3 oz (535 mg).  Crab, 3 oz (225 mg).  Dates, 5 each (270 mg).  Dried beans and peas,  cup (300-475 mg).  Figs, dried, 2 each (260 mg).  Fish: halibut, tuna, cod, snapper, 3 oz (480 mg).  Fish: salmon, haddock, swordfish, perch, 3 oz (300 mg).  Fish, tuna, canned 3 oz (200 mg).  [place] fries, fast food, 3 oz (470 mg).  Granola with fruit and nuts,  cup (200 mg).  Grapefruit juice,  cup (200 mg).  Greens, beet,  cup (655 mg).  Honeydew melon,  cup (200 mg).  Kale, raw, 1 cup (300 mg).  Kiwi, 1 medium (240 mg).  Kohlrabi, rutabaga, parsnips,  cup (280 mg).  Lentils,  cup (365 mg).  Mango, 1 each (325 mg).  Milk, chocolate, 1 cup (420 mg).  Milk: nonfat, low-fat, whole, buttermilk, 1 cup (350-380 mg).  Molasses, 1 Tbsp (295 mg).  Mushrooms,  cup (280) mg.  Nectarine, 1 each (275 mg).  Nuts: almonds, peanuts, hazelnuts, [place], cashew, mixed, 1 oz (200 mg).  Nuts, pistachios, 1 oz (295 mg).  Orange, 1 each (240 mg).  Orange juice,  cup (235 mg).  Papaya, medium,  fruit (390 mg).  Peanut butter, chunky, 2 Tbsp (240 mg).  Peanut butter, smooth, 2 Tbsp (210 mg).  Pear, 1 medium (200 mg).  Pomegranate, 1 whole (400 mg).  Pomegranate juice,  cup (215 mg).  Pork, 3 oz (350 mg).  Potato chips, salted, 1 oz (465 mg).  Potato, baked with skin, 1 medium (925 mg).  Potatoes, boiled,  cup (255 mg).  Potatoes, mashed,  cup (330 mg).  Prune juice,  cup (370 mg).  Prunes, 5 each (305 mg).  Pudding, chocolate,  cup (230 mg).  Pumpkin, canned,  cup (250 mg).  Raisins, seedless,  cup (270 mg).  Seeds, sunflower or pumpkin, 1 oz (240 mg).  Soy milk, 1 cup (300 mg).  Spinach,  cup (420 mg).  Spinach, canned,  cup (370 mg).  Sweet potato, baked with skin, 1 medium (450 mg).  Swiss chard,  cup (480 mg).  Tomato or  vegetable juice,  cup (275 mg).  Tomato sauce or puree,  cup (400-550 mg).  Tomato, raw, 1 medium (290 mg).  Tomatoes, canned,  cup (200-300 mg).  [place], 3 oz (250 mg).  Wheat germ, 1 oz (250 mg).  Winter squash,  cup (250 mg).  Yogurt, plain or fruited, 6 oz (260-435 mg).  Zucchini,  cup (220 mg).  Moderate in potassium The following foods and beverages have 50-200 mg of potassium per serving:  Apple, 1 each (150 mg).  Apple juice,  cup (150 mg).  Applesauce,  cup (90 mg).  Apricot nectar,  cup (140 mg).  Asparagus, small spears,  cup or 6 spears (155 mg).  Bagel, cinnamon raisin, 1 each (130 mg).  Bagel, egg or plain, 4 in., 1 each (70 mg).  Beans, green,  cup (90 mg).  Beans, yellow,  cup (190 mg).  Beer, regular, 12 oz (100 mg).  Beets, canned,  cup (125 mg).  Blackberries,  cup (115 mg).  Blueberries,  cup (60 mg).  Bread, whole wheat, 1 slice (70 mg).  Broccoli, raw,  cup (145 mg).  Cabbage,  cup (150 mg).  Carrots, cooked or raw,  cup (180 mg).  Cauliflower, raw,  cup (150 mg).  Celery, raw,  cup (155 mg).  Cereal, bran flakes, cup (120-150 mg).  Cheese, cottage,  cup (110 mg).  Cherries, 10 each (150 mg).  Chocolate, 1 oz bar (165 mg).  Coffee, brewed 6 oz (90 mg).  Corn,  cup or 1 ear (195 mg).  Cucumbers,  cup (80 mg).  Egg, large, 1 each (60 mg).  Eggplant,  cup (60 mg).  Endive, raw, cup (80 mg).  English muffin, 1 each (65 mg).  Fish, orange roughy, 3 oz (150 mg).  Frankfurter, beef or pork, 1 each (75 mg).  Fruit cocktail,  cup (115 mg).  Grape juice,  cup (170 mg).  Grapefruit,  fruit (175 mg).  Grapes,  cup (155 mg).  Greens: kale, turnip, collard,  cup (110-150 mg).  Ice cream or frozen yogurt, chocolate,  cup (175 mg).  Ice cream or frozen yogurt, vanilla,  cup (120-150 mg).  Lemons, limes, 1 each (80 mg).  Lettuce, all types, 1 cup (100 mg).  Mixed vegetables,   cup (150 mg).  Mushrooms, raw,  cup (110 mg).  Nuts: walnuts, pecans, or macadamia, 1 oz (125 mg).  Oatmeal,  cup (80 mg).  Okra,  cup (110 mg).  Onions, raw,  cup (120 mg).  Peach, 1 each (185 mg).  Peaches, canned,  cup (120 mg).  Pears, canned,  cup (120 mg).  Peas, green, frozen,  cup (90 mg).  Peppers, green,  cup (130 mg).  Peppers, red,  cup (160 mg).  Pineapple juice,  cup (165 mg).  Pineapple, fresh or canned,  cup (100 mg).  Plums, 1 each (105 mg).  Pudding, vanilla,  cup (150 mg).  Raspberries,  cup (90 mg).  Rhubarb,  cup (115 mg).  Rice, wild,  cup (80 mg).  Shrimp, 3 oz (155 mg).  Spinach, raw, 1 cup (170 mg).  Strawberries,  cup (125 mg).  Summer squash  cup (175-200 mg).  Swiss chard, raw, 1 cup (135 mg).  Tangerines, 1 each (140 mg).  Tea, brewed, 6 oz (65 mg).  Turnips,  cup (140 mg).  Watermelon,  cup (85 mg).  Wine, red, table, 5 oz (180 mg).  Wine, white, table, 5 oz (100 mg).  Low in potassium The following foods and beverages have less than 50 mg of potassium per serving.  Bread, white, 1 slice (30 mg).  Carbonated beverages, 12 oz (less than 5 mg).  Cheese, 1 oz (20-30 mg).  Cranberries,  cup (45 mg).  Cranberry juice cocktail,  cup (20 mg).  Fats and oils, 1 Tbsp (less than 5 mg).  Hummus, 1 Tbsp (32 mg).  Nectar: papaya, mango, or pear,  cup (35 mg).  Rice, white or brown,  cup (50 mg).  Spaghetti or macaroni,  cup cooked (30 mg).  Tortilla, flour or corn, 1 each (50 mg).  Waffle, 4 in., 1 each (50 mg).  Water chestnuts,  cup (40 mg).  This information is not intended to replace advice given to you by your health care provider. Make sure you discuss any questions you have with your health care provider. Document Released: 10/01/2004 Document Revised: 07/26/2015 Document Reviewed: 01/14/2013 Elsevier Interactive Patient Education  [employer].

## 2017-10-19 ENCOUNTER — Other Ambulatory Visit: Payer: Self-pay | Admitting: Nurse Practitioner

## 2017-10-19 DIAGNOSIS — H6690 Otitis media, unspecified, unspecified ear: Secondary | ICD-10-CM

## 2017-11-13 ENCOUNTER — Ambulatory Visit: Payer: BLUE CROSS/BLUE SHIELD | Admitting: Student

## 2017-12-21 ENCOUNTER — Encounter: Payer: Self-pay | Admitting: Family Medicine

## 2017-12-21 ENCOUNTER — Ambulatory Visit: Payer: BLUE CROSS/BLUE SHIELD | Admitting: Family Medicine

## 2017-12-21 VITALS — BP 158/74 | HR 98 | Temp 98.6°F | Ht 66.0 in | Wt 168.0 lb

## 2017-12-21 DIAGNOSIS — J0110 Acute frontal sinusitis, unspecified: Secondary | ICD-10-CM

## 2017-12-21 DIAGNOSIS — H66002 Acute suppurative otitis media without spontaneous rupture of ear drum, left ear: Secondary | ICD-10-CM

## 2017-12-21 MED ORDER — AMOXICILLIN-POT CLAVULANATE 875-125 MG PO TABS: 1.0000 | ORAL_TABLET | Freq: Two times a day (BID) | ORAL | 0 refills | Status: AC

## 2017-12-21 MED ORDER — METHYLPREDNISOLONE ACETATE 80 MG/ML IJ SUSP
80.0000 mg | Freq: Once | INTRAMUSCULAR | Status: AC
  Administered 2017-12-21: 80 mg via INTRAMUSCULAR

## 2017-12-21 NOTE — Progress Notes (Signed)
Subjective:    Patient ID: Lisa Parrish, female    DOB: 01/22/1961, 57 y.o.   MRN: [id number]  Chief Complaint:  Sinusitis (x 3 days, sinus pressure, drainage and congestion; bilateral ear pain; taking Nyquil, Dayquil, Flonase) and Cough   HPI: Lisa Parrish is a 57 y.o. female presenting on 12/21/2017 for Sinusitis (x 3 days, sinus pressure, drainage and congestion; bilateral ear pain; taking Nyquil, Dayquil, Flonase) and Cough  Pt presents today with complaints of headache, sinus pressure, ear pain and pressure, congestion, nonproductive cough, sore throat, and nasal drainage. Pt states this has been ongoing since Friday. States she has had chills and hot flashes, but has not measured her temperature at home. She states she has tried multiple over the counter remedies without relief of symptoms. Pt states the pain in her ears and head is pressure like and an 8/10. States the symptoms are worse at night. Denies chest pain, shortness of breath, dizziness, or palpitations.   Relevant past medical, surgical, family, and social history reviewed and updated as indicated.  Allergies and medications reviewed and updated.   Past Medical History:  Diagnosis Date  . Anxiety   . Depression   . Headache, migraine   . Hyperlipidemia   . Hypertension   . Kidney stones   . Osteoarthritis    back  . Pulmonary HTN ([place]) 12/21/2014  . S/P cardiac cath 12/20/14 normal coronary arteries 12/21/2014  . Seasonal allergies     Past Surgical History:  Procedure Laterality Date  . CARDIAC CATHETERIZATION N/A 12/20/2014   Procedure: Left Heart Cath and Coronary Angiography;  Surgeon: Sherren Mocha, MD;  Location: [facility];  Service: Cardiovascular;  Laterality: N/A;  . [place]  . lumbar disckectomy  2006  . TUBAL LIGATION  1990    Social History   Socioeconomic History  . Marital status: Married    Spouse name: Not on file  . Number of children: Not on  file  . Years of education: Not on file  . Highest education level: Not on file  Occupational History  . Not on file  Social Needs  . Financial resource strain: Not on file  . Food insecurity:    Worry: Not on file    Inability: Not on file  . Transportation needs:    Medical: Not on file    Non-medical: Not on file  Tobacco Use  . Smoking status: Former Smoker    Packs/day: 1.00    Years: 10.00    Pack years: 10.00    Types: Cigarettes    Last attempt to quit: 09/18/1991    Years since quitting: 26.2  . Smokeless tobacco: Never Used  Substance and Sexual Activity  . Alcohol use: No  . Drug use: No    Comment: no IVDA  . Sexual activity: Yes  Lifestyle  . Physical activity:    Days per week: Not on file    Minutes per session: Not on file  . Stress: Not on file  Relationships  . Social connections:    Talks on phone: Not on file    Gets together: Not on file    Attends religious service: Not on file    Active member of club or organization: Not on file    Attends meetings of clubs or organizations: Not on file    Relationship status: Not on file  . Intimate partner violence:    Fear of current or ex partner: Not  on file    Emotionally abused: Not on file    Physically abused: Not on file    Forced sexual activity: Not on file  Other Topics Concern  . Not on file  Social History Narrative   Married, works as a Radiation protection practitioner.      Outpatient Encounter Medications as of 12/21/2017  Medication Sig  . amLODipine (NORVASC) 5 MG tablet Take 1 tablet (5 mg total) by mouth daily.  Marland Kitchen azelastine (OPTIVAR) 0.05 % ophthalmic solution Place 1 drop into both eyes daily as needed (for allergy eye).  Marland Kitchen buPROPion (WELLBUTRIN XL) 300 MG 24 hr tablet Take 1 tablet (300 mg total) by mouth daily.  . chlorthalidone (HYGROTON) 25 MG tablet Take 0.5 tablets (12.5 mg total) by mouth 2 (two) times daily.  . fluticasone (FLONASE) 50 MCG/ACT nasal spray USE 2 SPRAYS INTO  EACH NOSTRIL EVERY DAY  . Multiple Vitamins-Minerals (HAIR/SKIN/NAILS PO) Take 1 tablet by mouth daily. Reported on 03/19/2015  . oxyCODONE-acetaminophen (PERCOCET/ROXICET) 5-325 MG tablet Take 1 tablet by mouth every 8 (eight) hours as needed for severe pain.  . pantoprazole (PROTONIX) 40 MG tablet Take 1 tablet (40 mg total) by mouth 2 (two) times daily before a meal.  . potassium chloride (K-DUR) 10 MEQ tablet Take 1 tablet (10 mEq total) by mouth daily.  Marland Kitchen tiZANidine (ZANAFLEX) 4 MG tablet TAKE 1 TABLET (4 MG TOTAL) BY MOUTH EVERY EIGHT (8) HOURS AS NEEDED.  Marland Kitchen amoxicillin-clavulanate (AUGMENTIN) 875-125 MG tablet Take 1 tablet by mouth 2 (two) times daily for 7 days.   Facility-Administered Encounter Medications as of 12/21/2017  Medication  . methylPREDNISolone acetate (DEPO-MEDROL) injection 80 mg    Allergies  Allergen Reactions  . Hydrocodone     Norco  . Morphine And Related Nausea And Vomiting  . Tramadol Other (See Comments)    Upset stomach    Review of Systems  Constitutional: Positive for chills and fatigue. Negative for fever.  HENT: Positive for congestion, ear pain, postnasal drip, rhinorrhea, sinus pressure, sinus pain and sore throat. Negative for sneezing and tinnitus.   Eyes: Negative for photophobia and visual disturbance.  Respiratory: Positive for cough. Negative for chest tightness and shortness of breath.   Cardiovascular: Negative for chest pain and palpitations.  Gastrointestinal: Negative for abdominal pain, nausea and vomiting.  Musculoskeletal: Negative for arthralgias and myalgias.  Neurological: Positive for headaches. Negative for dizziness, syncope, weakness and light-headedness.  Psychiatric/Behavioral: Negative for confusion.  All other systems reviewed and are negative.       Objective:    BP (!) 158/74 (BP Location: Right Arm)   Pulse 98   Temp 98.6 F (37 C) (Oral)   Ht 5\' 6"  (1.676 m)   Wt 168 lb (76.2 kg)   BMI 27.12 kg/m    Wt  Readings from Last 3 Encounters:  12/21/17 168 lb (76.2 kg)  10/13/17 169 lb 3.2 oz (76.7 kg)  10/01/17 168 lb (76.2 kg)    Physical Exam  Constitutional: She is oriented to person, place, and time. She appears well-developed and well-nourished. She is cooperative. She appears distressed (mildly).  HENT:  Head: Normocephalic and atraumatic.  Right Ear: Hearing, tympanic membrane, external ear and ear canal normal.  Left Ear: Hearing, external ear and ear canal normal. Tympanic membrane is erythematous. Tympanic membrane is not perforated and not bulging.  Nose: Mucosal edema and rhinorrhea present. Right sinus exhibits frontal sinus tenderness. Right sinus exhibits no maxillary sinus tenderness. Left sinus exhibits  frontal sinus tenderness. Left sinus exhibits no maxillary sinus tenderness.  Mouth/Throat: Uvula is midline and mucous membranes are normal. Posterior oropharyngeal erythema present. No oropharyngeal exudate, posterior oropharyngeal edema or tonsillar abscesses. No tonsillar exudate.  Eyes: Pupils are equal, round, and reactive to light. Conjunctivae, EOM and lids are normal.  Neck: Trachea normal and phonation normal. No JVD present. No thyroid mass and no thyromegaly present.  Cardiovascular: Normal rate, regular rhythm, normal heart sounds and intact distal pulses. Exam reveals no gallop and no friction rub.  No murmur heard. Pulmonary/Chest: Effort normal and breath sounds normal. No respiratory distress.  Lymphadenopathy:       Head (left side): Submandibular adenopathy present.    She has cervical adenopathy.  Neurological: She is alert and oriented to person, place, and time.  Skin: Skin is warm and dry. Capillary refill takes less than 2 seconds.  Psychiatric: She has a normal mood and affect. Her behavior is normal. Judgment and thought content normal.         Pertinent labs & imaging results that were available during my care of the patient were reviewed by me and  considered in my medical decision making.  Assessment & Plan:  Judythe was seen today for sinusitis and cough.  Diagnoses and all orders for this visit:  Acute non-recurrent frontal sinusitis Increase fluids. Tylenol or motrin as needed for fever control. Continue Flonase and add Zyrtec nightly. Medications as prescribed. Avoid smoking.  -     amoxicillin-clavulanate (AUGMENTIN) 875-125 MG tablet; Take 1 tablet by mouth 2 (two) times daily for 7 days. -     methylPREDNISolone acetate (DEPO-MEDROL) injection 80 mg  Non-recurrent acute suppurative otitis media of left ear without spontaneous rupture of tympanic membrane Increase fluids. Tylenol or motrin as needed for fever control. Continue Flonase and add Zyrtec nightly. Medications as prescribed. Avoid smoking.  -     amoxicillin-clavulanate (AUGMENTIN) 875-125 MG tablet; Take 1 tablet by mouth 2 (two) times daily for 7 days.      Continue all other maintenance medications.  Follow up plan: Return in about 4 weeks (around 01/18/2018), or if symptoms worsen or fail to improve.  Educational handout given for sinusitis  The above assessment and management plan was discussed with the patient. The patient verbalized understanding of and has agreed to the management plan. Patient is aware to call the clinic if symptoms persist or worsen. Patient is aware when to return to the clinic for a follow-up visit. Patient educated on when it is appropriate to go to the emergency department.   Monia Pouch, FNP-C [facility] [phone]

## 2017-12-21 NOTE — Patient Instructions (Signed)

## 2017-12-25 ENCOUNTER — Telehealth: Payer: Self-pay | Admitting: Cardiovascular Disease

## 2017-12-25 DIAGNOSIS — I10 Essential (primary) hypertension: Secondary | ICD-10-CM

## 2017-12-25 NOTE — Telephone Encounter (Signed)
Per pt phone call-- she's having really high BP and it's not going down. Wanting to speak w/ someone. [phone]

## 2017-12-25 NOTE — Telephone Encounter (Signed)
Pt concerned over BP at work.Had readings of 179/97 and 170/92, felt light headed   Takes amlodipine 5 mg daily and chlorthalidone 25 mg daily

## 2017-12-28 MED ORDER — AMLODIPINE BESYLATE 5 MG PO TABS: 7.5000 mg | ORAL_TABLET | Freq: Every day | ORAL | 3 refills | Status: DC

## 2017-12-28 NOTE — Telephone Encounter (Signed)
Pt has had no low BP readings, will increase amlodipine to 7.5 mg daily and call back on Friday with BP readings

## 2017-12-28 NOTE — Telephone Encounter (Signed)
Will forward to Dr Harl Bowie who is covering Dr Bronson Ing

## 2017-12-28 NOTE — Telephone Encounter (Signed)
I know she has had some issues in the past with low bp's, has she noticed any recent issues with that and if so what time of day? If no low bp's I would try increasing her norvasc in the morning to 7.5mg  daily and update [place] on her bp's at end of week   Zandra Abts MD

## 2017-12-30 ENCOUNTER — Other Ambulatory Visit: Payer: Self-pay | Admitting: Nurse Practitioner

## 2017-12-30 DIAGNOSIS — K219 Gastro-esophageal reflux disease without esophagitis: Secondary | ICD-10-CM

## 2018-01-01 ENCOUNTER — Ambulatory Visit: Payer: BLUE CROSS/BLUE SHIELD | Admitting: Nurse Practitioner

## 2018-02-12 ENCOUNTER — Other Ambulatory Visit: Payer: Self-pay | Admitting: Nurse Practitioner

## 2018-02-12 DIAGNOSIS — I10 Essential (primary) hypertension: Secondary | ICD-10-CM

## 2018-03-04 ENCOUNTER — Telehealth: Payer: Self-pay | Admitting: Nurse Practitioner

## 2018-03-04 ENCOUNTER — Encounter: Payer: Self-pay | Admitting: Nurse Practitioner

## 2018-03-04 ENCOUNTER — Ambulatory Visit: Payer: BLUE CROSS/BLUE SHIELD | Admitting: Nurse Practitioner

## 2018-03-04 VITALS — BP 150/96 | HR 81 | Temp 97.5°F | Ht 66.0 in | Wt 172.0 lb

## 2018-03-04 DIAGNOSIS — J00 Acute nasopharyngitis [common cold]: Secondary | ICD-10-CM | POA: Diagnosis not present

## 2018-03-04 DIAGNOSIS — M5416 Radiculopathy, lumbar region: Secondary | ICD-10-CM

## 2018-03-04 NOTE — Patient Instructions (Signed)
Upper Respiratory Infection, Adult An upper respiratory infection (URI) is a common viral infection of the nose, throat, and upper air passages that lead to the lungs. The most common type of URI is the common cold. URIs usually get better on their own, without medical treatment. What are the causes? A URI is caused by a virus. You may catch a virus by:  Breathing in droplets from an infected person's cough or sneeze.  Touching something that has been exposed to the virus (contaminated) and then touching your mouth, nose, or eyes. What increases the risk? You are more likely to get a URI if:  You are very young or very old.  It is autumn or winter.  You have close contact with others, such as at a daycare, school, or health care facility.  You smoke.  You have long-term (chronic) heart or lung disease.  You have a weakened disease-fighting (immune) system.  You have nasal allergies or asthma.  You are experiencing a lot of stress.  You work in an area that has poor air circulation.  You have poor nutrition. What are the signs or symptoms? A URI usually involves some of the following symptoms:  Runny or stuffy (congested) nose.  Sneezing.  Cough.  Sore throat.  Headache.  Fatigue.  Fever.  Loss of appetite.  Pain in your forehead, behind your eyes, and over your cheekbones (sinus pain).  Muscle aches.  Redness or irritation of the eyes.  Pressure in the ears or face. How is this diagnosed? This condition may be diagnosed based on your medical history and symptoms, and a physical exam. Your health care provider may use a cotton swab to take a mucus sample from your nose (nasal swab). This sample can be tested to determine what virus is causing the illness. How is this treated? URIs usually get better on their own within 7-10 days. You can take steps at home to relieve your symptoms. Medicines cannot cure URIs, but your health care provider may recommend  certain medicines to help relieve symptoms, such as:  Over-the-counter cold medicines.  Cough suppressants. Coughing is a type of defense against infection that helps to clear the respiratory system, so take these medicines only as recommended by your health care provider.  Fever-reducing medicines. Follow these instructions at home: Activity  Rest as needed.  If you have a fever, stay home from work or school until your fever is gone or until your health care provider says you are no longer contagious. Your health care provider may have you wear a face mask to prevent your infection from spreading. Relieving symptoms  Gargle with a salt-water mixture 3-4 times a day or as needed. To make a salt-water mixture, completely dissolve -1 tsp of salt in 1 cup of warm water.  Use a cool-mist humidifier to add moisture to the air. This can help you breathe more easily. Eating and drinking   Drink enough fluid to keep your urine pale yellow.  Eat soups and other clear broths. General instructions   Take over-the-counter and prescription medicines only as told by your health care provider. These include cold medicines, fever reducers, and cough suppressants.  Do not use any products that contain nicotine or tobacco, such as cigarettes and e-cigarettes. If you need help quitting, ask your health care provider.  Stay away from secondhand smoke.  Stay up to date on all immunizations, including the yearly (annual) flu vaccine.  Keep all follow-up visits as told by your health   care provider. This is important. How to prevent the spread of infection to others   URIs can be passed from person to person (are contagious). To prevent the infection from spreading: ? Wash your hands often with soap and water. If soap and water are not available, use hand sanitizer. ? Avoid touching your mouth, face, eyes, or nose. ? Cough or sneeze into a tissue or your sleeve or elbow instead of into your hand  or into the air. Contact a health care provider if:  You are getting worse instead of better.  You have a fever or chills.  Your mucus is brown or red.  You have yellow or brown discharge coming from your nose.  You have pain in your face, especially when you bend forward.  You have swollen neck glands.  You have pain while swallowing.  You have white areas in the back of your throat. Get help right away if:  You have shortness of breath that gets worse.  You have severe or persistent: ? Headache. ? Ear pain. ? Sinus pain. ? Chest pain.  You have chronic lung disease along with any of the following: ? Wheezing. ? Prolonged cough. ? Coughing up blood. ? A change in your usual mucus.  You have a stiff neck.  You have changes in your: ? Vision. ? Hearing. ? Thinking. ? Mood. Summary  An upper respiratory infection (URI) is a common infection of the nose, throat, and upper air passages that lead to the lungs.  A URI is caused by a virus.  URIs usually get better on their own within 7-10 days.  Medicines cannot cure URIs, but your health care provider may recommend certain medicines to help relieve symptoms. This information is not intended to replace advice given to you by your health care provider. Make sure you discuss any questions you have with your health care provider. Document Released: 08/13/2000 Document Revised: 10/03/2016 Document Reviewed: 10/03/2016 Elsevier Interactive Patient Education  2019 [employer]    

## 2018-03-04 NOTE — Progress Notes (Signed)
   Subjective:    Patient ID: Kynli Chou, female    DOB: 12/05/1960, 58 y.o.   MRN: [id number]   Chief Complaint: Needs referral to neurology (her neurologist is retiring)   HPI Patient comes in stating: - that she went to urgent care on Tuesday with upper resp symptoms. Was given antibiotic , steroids, cough meds and nasal spray. She says she is not much better. - needs referral to neurosurgeon for her back pain. She has had surgery on back 18 month sago. Her surgeon is retiring and she needs another neurosurgeon to se.   Review of Systems  Constitutional: Negative for activity change, appetite change, chills and fever.  HENT: Positive for congestion, rhinorrhea, sinus pain, sore throat and trouble swallowing.   Eyes: Negative for pain.  Respiratory: Positive for cough. Negative for shortness of breath.   Cardiovascular: Negative for chest pain, palpitations and leg swelling.  Gastrointestinal: Negative for abdominal pain.  Endocrine: Negative for polydipsia.  Genitourinary: Negative.   Musculoskeletal: Positive for back pain (chronic).  Skin: Negative for rash.  Neurological: Positive for headaches. Negative for dizziness and weakness.  Hematological: Does not bruise/bleed easily.  Psychiatric/Behavioral: Negative.   All other systems reviewed and are negative.      Objective:   Physical Exam Vitals signs and nursing note reviewed.  Constitutional:      Appearance: Normal appearance. She is normal weight.  HENT:     Right Ear: Tympanic membrane, ear canal and external ear normal.     Left Ear: Tympanic membrane, ear canal and external ear normal.     Nose: Nose normal.     Mouth/Throat:     Mouth: Mucous membranes are moist.  Eyes:     Pupils: Pupils are equal, round, and reactive to light.  Neck:     Musculoskeletal: Normal range of motion.  Cardiovascular:     Rate and Rhythm: Normal rate and regular rhythm.  Pulmonary:     Effort: Pulmonary effort is  normal.     Breath sounds: Normal breath sounds.  Musculoskeletal:     Comments: No back pain currently  Skin:    General: Skin is warm and dry.  Neurological:     General: No focal deficit present.     Mental Status: She is alert and oriented to person, place, and time.  Psychiatric:        Mood and Affect: Mood normal.        Behavior: Behavior normal.    BP (!) 150/96   Pulse 81   Temp (!) 97.5 F (36.4 C) (Oral)   Ht 5\' 6"  (1.676 m)   Wt 172 lb (78 kg)   BMI 27.76 kg/m         Assessment & Plan:  Mazella Deen in today with chief complaint of Needs referral to neurology (her neurologist is retiring)   1. Acute nasopharyngitis Continue all current meds rx by urgent care Force fluids Rest Humidifier RTO prn  2. Lumbar radiculopathy Get [place] meds from current neurosurgeon who is retiring She was told may end up at pain clinic - Ambulatory referral to [place], [place]

## 2018-03-05 ENCOUNTER — Telehealth: Payer: Self-pay | Admitting: Nurse Practitioner

## 2018-03-05 NOTE — Telephone Encounter (Signed)
Pt aware of provider feedback and voiced understanding. 

## 2018-03-05 NOTE — Telephone Encounter (Signed)
Was given z pak by urgent care- that stays in system for 10 days. Should not need another antibiotic.

## 2018-03-12 ENCOUNTER — Encounter: Payer: Self-pay | Admitting: Gastroenterology

## 2018-03-18 ENCOUNTER — Other Ambulatory Visit: Payer: Self-pay | Admitting: Nurse Practitioner

## 2018-03-22 ENCOUNTER — Ambulatory Visit (AMBULATORY_SURGERY_CENTER): Payer: BLUE CROSS/BLUE SHIELD | Admitting: *Deleted

## 2018-03-22 ENCOUNTER — Encounter: Payer: Self-pay | Admitting: Gastroenterology

## 2018-03-22 VITALS — Ht 65.0 in | Wt 172.0 lb

## 2018-03-22 DIAGNOSIS — Z86010 Personal history of colonic polyps: Secondary | ICD-10-CM

## 2018-03-22 MED ORDER — NA SULFATE-K SULFATE-MG SULF 17.5-3.13-1.6 GM/177ML PO SOLN: 1.0000 | Freq: Once | ORAL | 0 refills | Status: AC

## 2018-03-22 NOTE — Progress Notes (Signed)
No egg or soy allergy known to patient  No issues with past sedation with any surgeries  or procedures, no intubation problems  No diet pills per patient No home 02 use per patient  No blood thinners per patient  Pt denies issues with constipation  No A fib or A flutter  EMMI video sent to pt's e mail - pt declined  Suprep $15 on line coupon to pt in [place]

## 2018-04-05 ENCOUNTER — Ambulatory Visit (AMBULATORY_SURGERY_CENTER): Payer: BLUE CROSS/BLUE SHIELD | Admitting: Gastroenterology

## 2018-04-05 ENCOUNTER — Encounter: Payer: Self-pay | Admitting: Gastroenterology

## 2018-04-05 VITALS — BP 151/90 | HR 77 | Temp 99.1°F | Resp 18 | Ht 66.0 in | Wt 172.0 lb

## 2018-04-05 DIAGNOSIS — Z86010 Personal history of colonic polyps: Secondary | ICD-10-CM

## 2018-04-05 DIAGNOSIS — D128 Benign neoplasm of rectum: Secondary | ICD-10-CM

## 2018-04-05 DIAGNOSIS — D129 Benign neoplasm of anus and anal canal: Secondary | ICD-10-CM

## 2018-04-05 DIAGNOSIS — Z1211 Encounter for screening for malignant neoplasm of colon: Secondary | ICD-10-CM

## 2018-04-05 MED ORDER — SODIUM CHLORIDE 0.9 % IV SOLN: 500.0000 mL | Freq: Once | INTRAVENOUS | Status: DC

## 2018-04-05 NOTE — Progress Notes (Signed)
Pt's states no medical or surgical changes since previsit or office visit. 

## 2018-04-05 NOTE — Progress Notes (Signed)
Called to room to assist during endoscopic procedure.  Patient ID and intended procedure confirmed with present staff. Received instructions for my participation in the procedure from the performing physician.  

## 2018-04-05 NOTE — Patient Instructions (Signed)
YOU HAD AN ENDOSCOPIC PROCEDURE TODAY AT [facility]:   Refer to the procedure report that was given to you for any specific questions about what was found during the examination.  If the procedure report does not answer your questions, please call your gastroenterologist to clarify.  If you requested that your care partner not be given the details of your procedure findings, then the procedure report has been included in a sealed envelope for you to review at your convenience later.  YOU SHOULD EXPECT: Some feelings of bloating in the abdomen. Passage of more gas than usual.  Walking can help get rid of the air that was put into your GI tract during the procedure and reduce the bloating. If you had a lower endoscopy (such as a colonoscopy or flexible sigmoidoscopy) you may notice spotting of blood in your stool or on the toilet paper. If you underwent a bowel prep for your procedure, you may not have a normal bowel movement for a few days.  Please Note:  You might notice some irritation and congestion in your nose or some drainage.  This is from the oxygen used during your procedure.  There is no need for concern and it should clear up in a day or so.  SYMPTOMS TO REPORT IMMEDIATELY:   Following lower endoscopy (colonoscopy or flexible sigmoidoscopy):  Excessive amounts of blood in the stool  Significant tenderness or worsening of abdominal pains  Swelling of the abdomen that is new, acute  Fever of 100F or higher   For urgent or emergent issues, a gastroenterologist can be reached at any hour by calling [phone].   DIET:  We do recommend a small meal at first, but then you may proceed to your regular diet.  Drink plenty of fluids but you should avoid alcoholic beverages for 24 hours. Try to eat more fiber, and drink plenty of fiber.  ACTIVITY:  You should plan to take it easy for the rest of today and you should NOT DRIVE or use heavy machinery until tomorrow (because  of the sedation medicines used during the test).    FOLLOW UP: Our staff will call the number listed on your records the next business day following your procedure to check on you and address any questions or concerns that you may have regarding the information given to you following your procedure. If we do not reach you, we will leave a message.  However, if you are feeling well and you are not experiencing any problems, there is no need to return our call.  We will assume that you have returned to your regular daily activities without incident.  If any biopsies were taken you will be contacted by phone or by letter within the next 1-3 weeks.  Please call us at [phone] if you have not heard about the biopsies in 3 weeks.    SIGNATURES/CONFIDENTIALITY: You and/or your care partner have signed paperwork which will be entered into your electronic medical record.  These signatures attest to the fact that that the information above on your After Visit Summary has been reviewed and is understood.  Full responsibility of the confidentiality of this discharge information lies with you and/or your care-partner.  Read all handouts given to you by your recovery room nurse.

## 2018-04-05 NOTE — Progress Notes (Signed)
PT taken to PACU. Monitors in place. VSS. Report given to RN. 

## 2018-04-05 NOTE — Op Note (Signed)
[facility] Patient Name: Lisa Parrish Procedure Date: 04/05/2018 10:40 AM MRN: [id number] Endoscopist: Remo Lipps P. Havery Moros , MD Age: 58 Referring MD:  Date of Birth: [birth year] Gender: Female Account #: [id number] Procedure:                Colonoscopy Indications:              Surveillance: Personal history of adenomatous                            polyps on last colonoscopy > 5 years ago Medicines:                Monitored Anesthesia Care Procedure:                Pre-Anesthesia Assessment:                           - Prior to the procedure, a History and Physical                            was performed, and patient medications and                            allergies were reviewed. The patient's tolerance of                            previous anesthesia was also reviewed. The risks                            and benefits of the procedure and the sedation                            options and risks were discussed with the patient.                            All questions were answered, and informed consent                            was obtained. Prior Anticoagulants: The patient has                            taken no previous anticoagulant or antiplatelet                            agents. ASA Grade Assessment: II - A patient with                            mild systemic disease. After reviewing the risks                            and benefits, the patient was deemed in                            satisfactory condition to undergo the procedure.  After obtaining informed consent, the colonoscope                            was passed under direct vision. Throughout the                            procedure, the patient's blood pressure, pulse, and                            oxygen saturations were monitored continuously. The                            Model PCF-H190DL [id number]) scope was introduced                            through the  anus and advanced to the the cecum,                            identified by appendiceal orifice and ileocecal                            valve. The colonoscopy was performed without                            difficulty. The patient tolerated the procedure                            well. The quality of the bowel preparation was                            adequate. The ileocecal valve, appendiceal orifice,                            and rectum were photographed. Scope In: 10:46:40 AM Scope Out: 11:05:12 AM Scope Withdrawal Time: 0 hours 13 minutes 51 seconds  Total Procedure Duration: 0 hours 18 minutes 32 seconds  Findings:                 The perianal and digital rectal examinations were                            normal.                           A 6 mm polyp was found in the rectum. The polyp was                            sessile. The polyp was removed with a cold snare.                            Resection and retrieval were complete.                           A few small-mouthed diverticula were found in the  sigmoid colon.                           Internal hemorrhoids were found during retroflexion.                           The exam was otherwise without abnormality. Complications:            No immediate complications. Estimated blood loss:                            Minimal. Estimated Blood Loss:     Estimated blood loss was minimal. Impression:               - One 6 mm polyp in the rectum, removed with a cold                            snare. Resected and retrieved.                           - Diverticulosis in the sigmoid colon.                           - Internal hemorrhoids.                           - The examination was otherwise normal. Recommendation:           - Patient has a contact number available for                            emergencies. The signs and symptoms of potential                            delayed complications were  discussed with the                            patient. Return to normal activities tomorrow.                            Written discharge instructions were provided to the                            patient.                           - Resume previous diet.                           - Continue present medications.                           - Await pathology results. Remo Lipps P. Lisa Fouty, MD 04/05/2018 11:10:58 AM This report has been signed electronically.

## 2018-04-06 ENCOUNTER — Telehealth: Payer: Self-pay | Admitting: *Deleted

## 2018-04-06 NOTE — Telephone Encounter (Signed)
  Follow up Call-  Call back number 04/05/2018  Post procedure Call Back phone  # [phone]  Permission to leave phone message Yes  Some recent data might be hidden     Patient questions:  Do you have a fever, pain , or abdominal swelling? No. Pain Score  0 *  Have you tolerated food without any problems? Yes.    Have you been able to return to your normal activities? Yes.    Do you have any questions about your discharge instructions: Diet   No. Medications  No. Follow up visit  No.  Do you have questions or concerns about your Care? No.  Actions: * If pain score is 4 or above: No action needed, pain <4.

## 2018-04-09 ENCOUNTER — Other Ambulatory Visit: Payer: Self-pay | Admitting: Student

## 2018-04-09 NOTE — Telephone Encounter (Signed)
This is Dr. Koneswaran's pt. °

## 2018-04-12 ENCOUNTER — Other Ambulatory Visit: Payer: Self-pay | Admitting: Cardiology

## 2018-04-12 DIAGNOSIS — I10 Essential (primary) hypertension: Secondary | ICD-10-CM

## 2018-04-26 ENCOUNTER — Other Ambulatory Visit: Payer: Self-pay | Admitting: Nurse Practitioner

## 2018-04-26 DIAGNOSIS — F32A Depression, unspecified: Secondary | ICD-10-CM

## 2018-04-26 DIAGNOSIS — F329 Major depressive disorder, single episode, unspecified: Secondary | ICD-10-CM

## 2018-05-15 ENCOUNTER — Other Ambulatory Visit: Payer: Self-pay | Admitting: Nurse Practitioner

## 2018-05-18 ENCOUNTER — Ambulatory Visit: Payer: BLUE CROSS/BLUE SHIELD | Admitting: Family Medicine

## 2018-05-18 ENCOUNTER — Other Ambulatory Visit: Payer: Self-pay

## 2018-05-18 ENCOUNTER — Encounter: Payer: Self-pay | Admitting: Family Medicine

## 2018-05-18 VITALS — BP 160/99 | HR 88 | Temp 97.7°F | Ht 66.0 in | Wt 169.5 lb

## 2018-05-18 DIAGNOSIS — I10 Essential (primary) hypertension: Secondary | ICD-10-CM

## 2018-05-18 DIAGNOSIS — M5416 Radiculopathy, lumbar region: Secondary | ICD-10-CM

## 2018-05-18 MED ORDER — OXYCODONE-ACETAMINOPHEN 5-325 MG PO TABS: ORAL_TABLET | ORAL | 0 refills | Status: DC

## 2018-05-18 MED ORDER — AMLODIPINE BESYLATE 10 MG PO TABS: 10.0000 mg | ORAL_TABLET | Freq: Every day | ORAL | 3 refills | Status: DC

## 2018-05-18 MED ORDER — TIZANIDINE HCL 4 MG PO TABS: 4.0000 mg | ORAL_TABLET | Freq: Three times a day (TID) | ORAL | 2 refills | Status: DC

## 2018-05-18 NOTE — Progress Notes (Signed)
Subjective:  Patient ID: Lisa Parrish, female    DOB: 1960-03-07  Age: 58 y.o. MRN: [id number]  CC: Back Pain (pt here today to discuss her back pain and referral to pain management)   HPI Lisa Parrish presents for concerns regarding her pain management.  Her neurosurgeon, Dr. Carloyn Manner, is retiring.  She only has 2 weeks left of her pain meds.  She needs to get a referral to a pain management center.  She also needs a bridging supply to last her until that referral can be carried out.  Additionally she is out of her Zanaflex and needs refills on that.  Patient says she is currently tapering her oxycodone acetaminophen 5/325.  She was taking 8 daily and now is down to 5 a day as 1 4 hours after she goes to work to twice daily later in the day for a total of 5.  The morphine milligram daily equivalent is 37.5 currently.  Of note is that she had an L4 L5-S1 fusion done in August 2018 by Dr. Carloyn Manner.  Additionally he has been writing for restrictions of 25 pounds lifting with no bending or twisting.  He is also written to limit her to 8-hour days 40 hours per 7-day.Marland Kitchen  PMP aware shows that she has been getting consistently getting her medication in the amount she describes from Dr. Carloyn Manner or his midlevel app without any variation or inconsistency.  He is prescribing every 2 weeks.  Depression screen [facility] 2/9 05/18/2018 03/04/2018 12/21/2017  Decreased Interest 0 0 0  Down, Depressed, Hopeless 0 0 0  PHQ - 2 Score 0 0 0    History Lisa Parrish has a past medical history of Allergy, Anxiety, Constipation, Depression, GERD (gastroesophageal reflux disease), Headache, migraine, Hyperlipidemia, Hypertension, Kidney stones, Osteoarthritis, Pulmonary HTN ([place]) (12/21/2014), S/P cardiac cath 12/20/14 normal coronary arteries (12/21/2014), and Seasonal allergies.   She has a past surgical history that includes Hemorrhoid surgery (1989); Tubal ligation (1990); lumbar disckectomy (2006); Cardiac catheterization (N/A,  12/20/2014); Colonoscopy; Polypectomy; and Lumbar fusion (2018).   Her family history includes Cancer in her mother; Colon cancer in her paternal uncle; Colon cancer (age of onset: 24) in her paternal aunt and paternal uncle; Esophageal cancer in her brother; Heart disease in her father.She reports that she quit smoking about 26 years ago. Her smoking use included cigarettes. She has a 10.00 pack-year smoking history. She has never used smokeless tobacco. She reports that she does not drink alcohol or use drugs.    ROS Review of Systems  Constitutional: Negative.   HENT: Negative for congestion.   Eyes: Negative for visual disturbance.  Respiratory: Negative for shortness of breath.   Cardiovascular: Negative for chest pain.  Gastrointestinal: Negative for abdominal pain, constipation, diarrhea, nausea and vomiting.  Genitourinary: Negative for difficulty urinating.  Musculoskeletal: Positive for arthralgias, back pain and myalgias.  Neurological: Negative for headaches.  Psychiatric/Behavioral: Negative for sleep disturbance.    Objective:  BP (!) 160/99   Pulse 88   Temp 97.7 F (36.5 C) (Oral)   Ht 5\' 6"  (1.676 m)   Wt 169 lb 8 oz (76.9 kg)   BMI 27.36 kg/m   BP Readings from Last 3 Encounters:  05/18/18 (!) 160/99  04/05/18 (!) 151/90  03/04/18 (!) 150/96    Wt Readings from Last 3 Encounters:  05/18/18 169 lb 8 oz (76.9 kg)  04/05/18 172 lb (78 kg)  03/22/18 172 lb (78 kg)     Physical Exam Constitutional:  General: She is not in acute distress.    Appearance: She is well-developed.  Cardiovascular:     Rate and Rhythm: Normal rate and regular rhythm.  Pulmonary:     Breath sounds: Normal breath sounds.  Skin:    General: Skin is warm and dry.  Neurological:     Mental Status: She is alert and oriented to person, place, and time.       Assessment & Plan:   Lisa Parrish was seen today for back pain.  Diagnoses and all orders for this visit:   Essential hypertension -     amLODipine (NORVASC) 10 MG tablet; Take 1 tablet (10 mg total) by mouth daily.  Lumbar radiculopathy -     ToxASSURE Select 13 (MW), Urine  Other orders -     tiZANidine (ZANAFLEX) 4 MG tablet; Take 1 tablet (4 mg total) by mouth 3 (three) times daily. -     oxyCODONE-acetaminophen (PERCOCET/ROXICET) 5-325 MG tablet; One in AM, two in the afternoon and two at bedtime       I have discontinued Lisa Harter-[name]'s ipratropium. I have also changed her amLODipine, tiZANidine, and oxyCODONE-acetaminophen. Additionally, I am having her maintain her Multiple Vitamins-Minerals (HAIR/SKIN/NAILS PO), fluticasone, pantoprazole, potassium chloride, vitamin C, Magnesium, b complex vitamins, Polyethylene Glycol 3350 (MIRALAX PO), chlorthalidone, buPROPion, and azelastine.  Allergies as of 05/18/2018      Reactions   Hydrocodone    Norco   Morphine And Related Nausea And Vomiting   Promethazine    Burns through the IV   Tramadol Other (See Comments)   Upset stomach      Medication List       Accurate as of May 18, 2018  6:35 PM. Always use your most recent med list.        amLODipine 10 MG tablet Commonly known as:  NORVASC Take 1 tablet (10 mg total) by mouth daily.   azelastine 0.05 % ophthalmic solution Commonly known as:  OPTIVAR PLACE 1 DROP INTO EACH EYE ONCE DAILY AS NEEDED FOR ALLERGIES   b complex vitamins capsule Take 1 capsule by mouth daily.   buPROPion 300 MG 24 hr tablet Commonly known as:  WELLBUTRIN XL TAKE 1 TABLET BY MOUTH EVERY DAY   chlorthalidone 25 MG tablet Commonly known as:  HYGROTON TAKE 1/2 TABLETS (12.5 MG TOTAL) BY MOUTH 2 (TWO) TIMES DAILY.   fluticasone 50 MCG/ACT nasal spray Commonly known as:  FLONASE USE 2 SPRAYS INTO EACH NOSTRIL EVERY DAY   HAIR/SKIN/NAILS PO Take 1 tablet by mouth daily. Reported on 03/19/2015   Magnesium 250 MG Tabs Take by mouth.   MIRALAX PO 2-3 times a week    oxyCODONE-acetaminophen 5-325 MG tablet Commonly known as:  PERCOCET/ROXICET One in AM, two in the afternoon and two at bedtime Start taking on:  May 27, 2018   pantoprazole 40 MG tablet Commonly known as:  PROTONIX TAKE 1 TABLET (40 MG TOTAL) BY MOUTH 2 (TWO) TIMES DAILY BEFORE A MEAL.   potassium chloride 10 MEQ tablet Commonly known as:  K-DUR TAKE 1 TABLET BY MOUTH EVERY DAY   tiZANidine 4 MG tablet Commonly known as:  ZANAFLEX Take 1 tablet (4 mg total) by mouth 3 (three) times daily.   vitamin C 1000 MG tablet Take 1,000 mg by mouth daily.        Follow-up: Return in about 1 month (around 06/18/2018).  Claretta Fraise, M.D.

## 2018-05-24 LAB — TOXASSURE SELECT 13 (MW), URINE

## 2018-05-28 ENCOUNTER — Encounter: Payer: Self-pay | Admitting: Family Medicine

## 2018-05-31 ENCOUNTER — Other Ambulatory Visit: Payer: Self-pay | Admitting: Family Medicine

## 2018-05-31 DIAGNOSIS — I10 Essential (primary) hypertension: Secondary | ICD-10-CM

## 2018-05-31 MED ORDER — AMLODIPINE BESYLATE 5 MG PO TABS: 5.0000 mg | ORAL_TABLET | Freq: Every day | ORAL | 2 refills | Status: DC

## 2018-06-12 ENCOUNTER — Other Ambulatory Visit: Payer: Self-pay | Admitting: Nurse Practitioner

## 2018-06-12 DIAGNOSIS — K219 Gastro-esophageal reflux disease without esophagitis: Secondary | ICD-10-CM

## 2018-06-28 ENCOUNTER — Ambulatory Visit (INDEPENDENT_AMBULATORY_CARE_PROVIDER_SITE_OTHER): Payer: BLUE CROSS/BLUE SHIELD | Admitting: Family Medicine

## 2018-06-28 ENCOUNTER — Other Ambulatory Visit: Payer: Self-pay

## 2018-06-28 ENCOUNTER — Encounter: Payer: Self-pay | Admitting: Family Medicine

## 2018-06-28 DIAGNOSIS — G8929 Other chronic pain: Secondary | ICD-10-CM | POA: Diagnosis not present

## 2018-06-28 DIAGNOSIS — I10 Essential (primary) hypertension: Secondary | ICD-10-CM

## 2018-06-28 DIAGNOSIS — F1120 Opioid dependence, uncomplicated: Secondary | ICD-10-CM

## 2018-06-28 DIAGNOSIS — M5416 Radiculopathy, lumbar region: Secondary | ICD-10-CM

## 2018-06-28 DIAGNOSIS — I272 Pulmonary hypertension, unspecified: Secondary | ICD-10-CM | POA: Diagnosis not present

## 2018-06-28 MED ORDER — DILTIAZEM HCL ER COATED BEADS 240 MG PO CP24: 240.0000 mg | ORAL_CAPSULE | Freq: Every day | ORAL | 1 refills | Status: DC

## 2018-06-28 MED ORDER — OXYCODONE-ACETAMINOPHEN 5-325 MG PO TABS: ORAL_TABLET | ORAL | 0 refills | Status: DC

## 2018-06-28 NOTE — Progress Notes (Signed)
Subjective:  Patient ID: Lisa Parrish, female    DOB: 07-21-1960  Age: 58 y.o. MRN: [id number]  CC: No chief complaint on file.   HPI Treonna Klee presents for  follow-up of hypertension. Patient has no history of headache chest pain or shortness of breath or recent cough. Patient also denies symptoms of TIA such as focal numbness or weakness. Patient says amlodipine causing painful swelling. Wants to switch. Back pain is unchanged. Due refill BP 150/100-160/100. PDMP shows no discrepancy (Transferrring from Dr. Carloyn Manner who is retiring was noted.) No drug screen today due to televisit format. MME daily is 37.5  History Chloe has a past medical history of Allergy, Anxiety, Constipation, Depression, GERD (gastroesophageal reflux disease), Headache, migraine, Hyperlipidemia, Hypertension, Kidney stones, Osteoarthritis, Pulmonary HTN ([place]) (12/21/2014), S/P cardiac cath 12/20/14 normal coronary arteries (12/21/2014), and Seasonal allergies.   She has a past surgical history that includes Hemorrhoid surgery (1989); Tubal ligation (1990); lumbar disckectomy (2006); Cardiac catheterization (N/A, 12/20/2014); Colonoscopy; Polypectomy; and Lumbar fusion (2018).   Her family history includes Cancer in her mother; Colon cancer in her paternal uncle; Colon cancer (age of onset: 47) in her paternal aunt and paternal uncle; Esophageal cancer in her brother; Heart disease in her father.She reports that she quit smoking about 26 years ago. Her smoking use included cigarettes. She has a 10.00 pack-year smoking history. She has never used smokeless tobacco. She reports that she does not drink alcohol or use drugs.  Current Outpatient Medications on File Prior to Visit  Medication Sig Dispense Refill  . Ascorbic Acid (VITAMIN C) 1000 MG tablet Take 1,000 mg by mouth daily.    Marland Kitchen azelastine (OPTIVAR) 0.05 % ophthalmic solution PLACE 1 DROP INTO EACH EYE ONCE DAILY AS NEEDED FOR ALLERGIES 18 mL 3  . b  complex vitamins capsule Take 1 capsule by mouth daily.    Marland Kitchen buPROPion (WELLBUTRIN XL) 300 MG 24 hr tablet TAKE 1 TABLET BY MOUTH EVERY DAY 90 tablet 0  . chlorthalidone (HYGROTON) 25 MG tablet TAKE 1/2 TABLETS (12.5 MG TOTAL) BY MOUTH 2 (TWO) TIMES DAILY. 90 tablet 1  . fluticasone (FLONASE) 50 MCG/ACT nasal spray USE 2 SPRAYS INTO EACH NOSTRIL EVERY DAY 48 g 1  . Magnesium 250 MG TABS Take by mouth.    . Multiple Vitamins-Minerals (HAIR/SKIN/NAILS PO) Take 1 tablet by mouth daily. Reported on 03/19/2015    . pantoprazole (PROTONIX) 40 MG tablet TAKE 1 TABLET (40 MG TOTAL) BY MOUTH 2 (TWO) TIMES DAILY BEFORE A MEAL. 180 tablet 1  . Polyethylene Glycol 3350 (MIRALAX PO) 2-3 times a week    . potassium chloride (K-DUR) 10 MEQ tablet TAKE 1 TABLET BY MOUTH EVERY DAY 90 tablet 1  . tiZANidine (ZANAFLEX) 4 MG tablet Take 1 tablet (4 mg total) by mouth 3 (three) times daily. 90 tablet 2   No current facility-administered medications on file prior to visit.     ROS Review of Systems  Constitutional: Negative.   HENT: Negative for congestion.   Eyes: Negative for visual disturbance.  Respiratory: Negative for shortness of breath.   Cardiovascular: Negative for chest pain.  Gastrointestinal: Negative for abdominal pain, constipation, diarrhea, nausea and vomiting.  Genitourinary: Negative for difficulty urinating.  Musculoskeletal: Positive for arthralgias, back pain and myalgias.  Neurological: Negative for headaches.  Psychiatric/Behavioral: Negative for sleep disturbance.    Objective:  There were no vitals taken for this visit.  BP Readings from Last 3 Encounters:  05/18/18 (!) 160/99  04/05/18 Marland Kitchen)  151/90  03/04/18 (!) 150/96    Wt Readings from Last 3 Encounters:  05/18/18 169 lb 8 oz (76.9 kg)  04/05/18 172 lb (78 kg)  03/22/18 172 lb (78 kg)     Physical Exam  Exam deferred. Pt. Harboring due to COVID 19. Phone visit performed.   Assessment & Plan:   Diagnoses and all  orders for this visit:  Essential hypertension  Pulmonary HTN (HCC)  Lumbar radiculopathy  Other chronic pain  Opiate dependence, continuous (HCC)  Other orders -     diltiazem (CARDIZEM CD) 240 MG 24 hr capsule; Take 1 capsule (240 mg total) by mouth daily. For blood pressure -     oxyCODONE-acetaminophen (PERCOCET/ROXICET) 5-325 MG tablet; One in AM, two in the afternoon and two at bedtime   Allergies as of 06/28/2018      Reactions   Amlodipine Swelling   Hydrocodone    Norco   Morphine And Related Nausea And Vomiting   Promethazine    Burns through the IV   Tramadol Other (See Comments)   Upset stomach      Medication List       Accurate as of June 28, 2018  5:09 PM. Always use your most recent med list.        azelastine 0.05 % ophthalmic solution Commonly known as:  OPTIVAR PLACE 1 DROP INTO EACH EYE ONCE DAILY AS NEEDED FOR ALLERGIES   b complex vitamins capsule Take 1 capsule by mouth daily.   buPROPion 300 MG 24 hr tablet Commonly known as:  WELLBUTRIN XL TAKE 1 TABLET BY MOUTH EVERY DAY   chlorthalidone 25 MG tablet Commonly known as:  HYGROTON TAKE 1/2 TABLETS (12.5 MG TOTAL) BY MOUTH 2 (TWO) TIMES DAILY.   diltiazem 240 MG 24 hr capsule Commonly known as:  Cardizem CD Take 1 capsule (240 mg total) by mouth daily. For blood pressure   fluticasone 50 MCG/ACT nasal spray Commonly known as:  FLONASE USE 2 SPRAYS INTO EACH NOSTRIL EVERY DAY   HAIR/SKIN/NAILS PO Take 1 tablet by mouth daily. Reported on 03/19/2015   Magnesium 250 MG Tabs Take by mouth.   MIRALAX PO 2-3 times a week   oxyCODONE-acetaminophen 5-325 MG tablet Commonly known as:  PERCOCET/ROXICET One in AM, two in the afternoon and two at bedtime Start taking on:  July 01, 2018   pantoprazole 40 MG tablet Commonly known as:  PROTONIX TAKE 1 TABLET (40 MG TOTAL) BY MOUTH 2 (TWO) TIMES DAILY BEFORE A MEAL.   potassium chloride 10 MEQ tablet Commonly known as:  K-DUR TAKE  1 TABLET BY MOUTH EVERY DAY   tiZANidine 4 MG tablet Commonly known as:  ZANAFLEX Take 1 tablet (4 mg total) by mouth 3 (three) times daily.   vitamin C 1000 MG tablet Take 1,000 mg by mouth daily.       Meds ordered this encounter  Medications  . diltiazem (CARDIZEM CD) 240 MG 24 hr capsule    Sig: Take 1 capsule (240 mg total) by mouth daily. For blood pressure    Dispense:  90 capsule    Refill:  1  . oxyCODONE-acetaminophen (PERCOCET/ROXICET) 5-325 MG tablet    Sig: One in AM, two in the afternoon and two at bedtime    Dispense:  150 tablet    Refill:  0    Virtual Visit via telephone Note  I discussed the limitations, risks, security and privacy concerns of performing an evaluation and management service by telephone  and the availability of in person appointments. I also discussed with the patient that there may be a patient responsible charge related to this service. The patient expressed understanding and agreed to proceed. Pt. Is at home. Dr. Livia Snellen is in his office.  Follow Up Instructions:   I discussed the assessment and treatment plan with the patient. The patient was provided an opportunity to ask questions and all were answered. The patient agreed with the plan and demonstrated an understanding of the instructions.   The patient was advised to call back or seek an in-person evaluation if the symptoms worsen or if the condition fails to improve as anticipated.  Visit started: 3:55 Call ended:  4:15 Total minutes including chart review and phone contact time: 25   Follow-up: Return in about 1 month (around 07/28/2018).  Claretta Fraise, M.D.

## 2018-07-12 ENCOUNTER — Other Ambulatory Visit: Payer: Self-pay | Admitting: Nurse Practitioner

## 2018-07-12 DIAGNOSIS — F32A Depression, unspecified: Secondary | ICD-10-CM

## 2018-07-12 DIAGNOSIS — F329 Major depressive disorder, single episode, unspecified: Secondary | ICD-10-CM

## 2018-07-23 ENCOUNTER — Other Ambulatory Visit: Payer: Self-pay

## 2018-07-27 ENCOUNTER — Encounter: Payer: Self-pay | Admitting: Family Medicine

## 2018-07-27 ENCOUNTER — Ambulatory Visit (INDEPENDENT_AMBULATORY_CARE_PROVIDER_SITE_OTHER): Payer: BLUE CROSS/BLUE SHIELD | Admitting: Family Medicine

## 2018-07-27 ENCOUNTER — Other Ambulatory Visit: Payer: Self-pay

## 2018-07-27 VITALS — BP 151/103 | HR 110 | Temp 97.7°F | Ht 66.0 in | Wt 170.0 lb

## 2018-07-27 DIAGNOSIS — I10 Essential (primary) hypertension: Secondary | ICD-10-CM | POA: Diagnosis not present

## 2018-07-27 DIAGNOSIS — M5416 Radiculopathy, lumbar region: Secondary | ICD-10-CM

## 2018-07-27 MED ORDER — DILTIAZEM HCL ER COATED BEADS 300 MG PO CP24: 300.0000 mg | ORAL_CAPSULE | Freq: Every day | ORAL | 2 refills | Status: DC

## 2018-07-27 MED ORDER — OXYCODONE-ACETAMINOPHEN 5-325 MG PO TABS: ORAL_TABLET | ORAL | 0 refills | Status: DC

## 2018-07-27 MED ORDER — PREGABALIN 50 MG PO CAPS: ORAL_CAPSULE | ORAL | 1 refills | Status: DC

## 2018-07-27 NOTE — Progress Notes (Signed)
Subjective:  Patient ID: Lisa Parrish, female    DOB: 08/03/1960  Age: 58 y.o. MRN: [id number]  CC: Hypertension and pain management   HPI Lisa Parrish presents for  follow-up of hypertension. Patient has no history of headache chest pain or shortness of breath or recent cough. Patient also denies symptoms of TIA such as focal numbness or weakness. Patient denies side effects from medication. States taking it regularly.  Patient is also here for follow-up on her chronic pain.  She tells me that she has been less active since the [place] pandemic led to closure of her gym.  She has been less active for the last couple months.  For this or what ever reason her back pain has increased to 7-8/10.  It is constant.  There is radiation to the left lower extremity in the chronically but she is noticed recently some occasional radiation into the right lower extremity.  Patient would like to have added relief.  She notes that she cannot tolerate gabapentin due to its causing intense terrifying dreams.  Patient forgot to bring her FMLA form.  However she has 2 to 3 days/week possibly even every week or every other week where she misses work due to her pain. History Lisa Parrish has a past medical history of Allergy, Anxiety, Constipation, Depression, GERD (gastroesophageal reflux disease), Headache, migraine, Hyperlipidemia, Hypertension, Kidney stones, Osteoarthritis, Pulmonary HTN ([place]) (12/21/2014), S/P cardiac cath 12/20/14 normal coronary arteries (12/21/2014), and Seasonal allergies.   She has a past surgical history that includes Hemorrhoid surgery (1989); Tubal ligation (1990); lumbar disckectomy (2006); Cardiac catheterization (N/A, 12/20/2014); Colonoscopy; Polypectomy; and Lumbar fusion (2018).   Her family history includes Cancer in her mother; Colon cancer in her paternal uncle; Colon cancer (age of onset: 65) in her paternal aunt and paternal uncle; Esophageal cancer in her brother; Heart  disease in her father.She reports that she quit smoking about 26 years ago. Her smoking use included cigarettes. She has a 10.00 pack-year smoking history. She has never used smokeless tobacco. She reports that she does not drink alcohol or use drugs.  Current Outpatient Medications on File Prior to Visit  Medication Sig Dispense Refill   Ascorbic Acid (VITAMIN C) 1000 MG tablet Take 1,000 mg by mouth daily.     azelastine (OPTIVAR) 0.05 % ophthalmic solution PLACE 1 DROP INTO EACH EYE ONCE DAILY AS NEEDED FOR ALLERGIES 18 mL 3   b complex vitamins capsule Take 1 capsule by mouth daily.     buPROPion (WELLBUTRIN XL) 300 MG 24 hr tablet TAKE 1 TABLET BY MOUTH EVERY DAY 90 tablet 0   chlorthalidone (HYGROTON) 25 MG tablet TAKE 1/2 TABLETS (12.5 MG TOTAL) BY MOUTH 2 (TWO) TIMES DAILY. 90 tablet 1   fluticasone (FLONASE) 50 MCG/ACT nasal spray USE 2 SPRAYS INTO EACH NOSTRIL EVERY DAY 48 g 1   Magnesium 250 MG TABS Take by mouth.     Multiple Vitamins-Minerals (HAIR/SKIN/NAILS PO) Take 1 tablet by mouth daily. Reported on 03/19/2015     pantoprazole (PROTONIX) 40 MG tablet TAKE 1 TABLET (40 MG TOTAL) BY MOUTH 2 (TWO) TIMES DAILY BEFORE A MEAL. 180 tablet 1   Polyethylene Glycol 3350 (MIRALAX PO) 2-3 times a week     potassium chloride (K-DUR) 10 MEQ tablet TAKE 1 TABLET BY MOUTH EVERY DAY 90 tablet 1   tiZANidine (ZANAFLEX) 4 MG tablet Take 1 tablet (4 mg total) by mouth 3 (three) times daily. 90 tablet 2   No current facility-administered medications on  file prior to visit.     ROS Review of Systems  Constitutional: Negative.   HENT: Negative.   Eyes: Negative for visual disturbance.  Respiratory: Negative for shortness of breath.   Cardiovascular: Negative for chest pain.  Gastrointestinal: Negative for abdominal pain.  Musculoskeletal: Negative for arthralgias.    Objective:  BP (!) 151/103    Pulse (!) 110    Temp 97.7 F (36.5 C) (Oral)    Ht 5\' 6"  (1.676 m)    Wt 170 lb  (77.1 kg)    BMI 27.44 kg/m   BP Readings from Last 3 Encounters:  07/27/18 (!) 151/103  05/18/18 (!) 160/99  04/05/18 (!) 151/90    Wt Readings from Last 3 Encounters:  07/27/18 170 lb (77.1 kg)  05/18/18 169 lb 8 oz (76.9 kg)  04/05/18 172 lb (78 kg)     Physical Exam Constitutional:      General: She is not in acute distress.    Appearance: She is well-developed.  Cardiovascular:     Rate and Rhythm: Normal rate and regular rhythm.  Pulmonary:     Breath sounds: Normal breath sounds.  Skin:    General: Skin is warm and dry.  Neurological:     Mental Status: She is alert and oriented to person, place, and time.       Assessment & Plan:   Lisa Parrish was seen today for hypertension and pain management.  Diagnoses and all orders for this visit:  Lumbar radiculopathy  Essential hypertension  Other orders -     diltiazem (CARDIZEM CD) 300 MG 24 hr capsule; Take 1 capsule (300 mg total) by mouth daily. For blood pressure -     oxyCODONE-acetaminophen (PERCOCET/ROXICET) 5-325 MG tablet; One in AM, two in the afternoon and two at bedtime -     pregabalin (LYRICA) 50 MG capsule; 1 qhs X7 days , then 2 qhs X 7d, then 3 qhs X 7d, then 4 qhs   Allergies as of 07/27/2018      Reactions   Gabapentin Anxiety   Intense, alarming dreams   Amlodipine Swelling   Hydrocodone    Norco   Morphine And Related Nausea And Vomiting   Promethazine    Burns through the IV   Tramadol Other (See Comments)   Upset stomach      Medication List       Accurate as of Jul 27, 2018 12:22 PM. If you have any questions, ask your nurse or doctor.        azelastine 0.05 % ophthalmic solution Commonly known as:  OPTIVAR PLACE 1 DROP INTO EACH EYE ONCE DAILY AS NEEDED FOR ALLERGIES   b complex vitamins capsule Take 1 capsule by mouth daily.   buPROPion 300 MG 24 hr tablet Commonly known as:  WELLBUTRIN XL TAKE 1 TABLET BY MOUTH EVERY DAY   chlorthalidone 25 MG tablet Commonly known  as:  HYGROTON TAKE 1/2 TABLETS (12.5 MG TOTAL) BY MOUTH 2 (TWO) TIMES DAILY.   diltiazem 300 MG 24 hr capsule Commonly known as:  Cardizem CD Take 1 capsule (300 mg total) by mouth daily. For blood pressure What changed:    medication strength  how much to take Changed by:  Claretta Fraise, MD   fluticasone 50 MCG/ACT nasal spray Commonly known as:  FLONASE USE 2 SPRAYS INTO EACH NOSTRIL EVERY DAY   HAIR/SKIN/NAILS PO Take 1 tablet by mouth daily. Reported on 03/19/2015   Magnesium 250 MG Tabs Take by mouth.  MIRALAX PO 2-3 times a week   oxyCODONE-acetaminophen 5-325 MG tablet Commonly known as:  PERCOCET/ROXICET One in AM, two in the afternoon and two at bedtime   pantoprazole 40 MG tablet Commonly known as:  PROTONIX TAKE 1 TABLET (40 MG TOTAL) BY MOUTH 2 (TWO) TIMES DAILY BEFORE A MEAL.   potassium chloride 10 MEQ tablet Commonly known as:  K-DUR TAKE 1 TABLET BY MOUTH EVERY DAY   pregabalin 50 MG capsule Commonly known as:  Lyrica 1 qhs X7 days , then 2 qhs X 7d, then 3 qhs X 7d, then 4 qhs Started by:  Claretta Fraise, MD   tiZANidine 4 MG tablet Commonly known as:  ZANAFLEX Take 1 tablet (4 mg total) by mouth 3 (three) times daily.   vitamin C 1000 MG tablet Take 1,000 mg by mouth daily.       Meds ordered this encounter  Medications   diltiazem (CARDIZEM CD) 300 MG 24 hr capsule    Sig: Take 1 capsule (300 mg total) by mouth daily. For blood pressure    Dispense:  30 capsule    Refill:  2   oxyCODONE-acetaminophen (PERCOCET/ROXICET) 5-325 MG tablet    Sig: One in AM, two in the afternoon and two at bedtime    Dispense:  150 tablet    Refill:  0   pregabalin (LYRICA) 50 MG capsule    Sig: 1 qhs X7 days , then 2 qhs X 7d, then 3 qhs X 7d, then 4 qhs    Dispense:  120 capsule    Refill:  1    Cardizem increased.  Follow-up: Return in about 1 month (around 08/27/2018).  Claretta Fraise, M.D.

## 2018-07-27 NOTE — Patient Instructions (Signed)

## 2018-07-28 DIAGNOSIS — Z029 Encounter for administrative examinations, unspecified: Secondary | ICD-10-CM

## 2018-08-20 ENCOUNTER — Other Ambulatory Visit: Payer: Self-pay | Admitting: Nurse Practitioner

## 2018-08-20 DIAGNOSIS — H6690 Otitis media, unspecified, unspecified ear: Secondary | ICD-10-CM

## 2018-08-26 ENCOUNTER — Telehealth: Payer: Self-pay | Admitting: Family Medicine

## 2018-08-27 NOTE — Telephone Encounter (Signed)
Pt. Needs to be seen ( or televisit)for this. Thanks, WS

## 2018-08-27 NOTE — Telephone Encounter (Signed)
Left message to please call our office to schedule an appointment for televisit or office visit for pain management.

## 2018-08-30 ENCOUNTER — Ambulatory Visit (INDEPENDENT_AMBULATORY_CARE_PROVIDER_SITE_OTHER): Payer: BC Managed Care – PPO | Admitting: Family Medicine

## 2018-08-30 ENCOUNTER — Other Ambulatory Visit: Payer: Self-pay

## 2018-08-30 ENCOUNTER — Other Ambulatory Visit: Payer: Self-pay | Admitting: Nurse Practitioner

## 2018-08-30 ENCOUNTER — Encounter: Payer: Self-pay | Admitting: Family Medicine

## 2018-08-30 DIAGNOSIS — M5416 Radiculopathy, lumbar region: Secondary | ICD-10-CM

## 2018-08-30 DIAGNOSIS — Z6827 Body mass index (BMI) 27.0-27.9, adult: Secondary | ICD-10-CM | POA: Diagnosis not present

## 2018-08-30 MED ORDER — PREGABALIN 300 MG PO CAPS: 300.0000 mg | ORAL_CAPSULE | Freq: Every day | ORAL | 2 refills | Status: DC

## 2018-08-30 MED ORDER — OXYCODONE-ACETAMINOPHEN 5-325 MG PO TABS: ORAL_TABLET | ORAL | 0 refills | Status: DC

## 2018-08-30 NOTE — Progress Notes (Signed)
Subjective:    Patient ID: Lisa Parrish, female    DOB: 1960-10-09, 58 y.o.   MRN: [id number]   HPI: Lisa Parrish is a 58 y.o. female presenting for  Chronic back pain. Taking lyrica. No swelling. Still has to take oxycodone. Periodic HA.  6/10 pain in back on average. Can go to 8 at work. Trying to stay limber, flexible. Decreased radiation to left. A little in RLE.   Depression screen [facility] 2/9 07/27/2018 05/18/2018 03/04/2018 12/21/2017 10/01/2017  Decreased Interest 0 0 0 0 0  Down, Depressed, Hopeless 0 0 0 0 0  PHQ - 2 Score 0 0 0 0 0     Relevant past medical, surgical, family and social history reviewed and updated as indicated.  Interim medical history since our last visit reviewed. Allergies and medications reviewed and updated.  ROS:  Review of Systems  Constitutional: Negative.   HENT: Negative for congestion.   Eyes: Negative for visual disturbance.  Respiratory: Negative for shortness of breath.   Cardiovascular: Negative for chest pain.  Gastrointestinal: Negative for abdominal pain, constipation, diarrhea, nausea and vomiting.  Endocrine: Positive for polyphagia.  Genitourinary: Negative for difficulty urinating.  Musculoskeletal: Positive for arthralgias, back pain and myalgias.  Neurological: Negative for headaches.  Psychiatric/Behavioral: Negative for sleep disturbance.     Social History   Tobacco Use  Smoking Status Former Smoker  . Packs/day: 1.00  . Years: 10.00  . Pack years: 10.00  . Types: Cigarettes  . Quit date: 09/18/1991  . Years since quitting: 26.9  Smokeless Tobacco Never Used       Objective:     Wt Readings from Last 3 Encounters:  07/27/18 170 lb (77.1 kg)  05/18/18 169 lb 8 oz (76.9 kg)  04/05/18 172 lb (78 kg)     Exam deferred. Pt. Harboring due to COVID 19. Phone visit performed.   Assessment & Plan:   1. Lumbar radiculopathy   2. BMI 27.0-27.9,adult     Meds ordered this encounter  Medications  .  pregabalin (LYRICA) 300 MG capsule    Sig: Take 1 capsule (300 mg total) by mouth at bedtime.    Dispense:  30 capsule    Refill:  2  . DISCONTD: oxyCODONE-acetaminophen (PERCOCET/ROXICET) 5-325 MG tablet    Sig: One in AM, two in the afternoon and two at bedtime    Dispense:  150 tablet    Refill:  0  . oxyCODONE-acetaminophen (PERCOCET/ROXICET) 5-325 MG tablet    Sig: One in AM, two in the afternoon and two at bedtime    Dispense:  150 tablet    Refill:  0    No orders of the defined types were placed in this encounter.     Diagnoses and all orders for this visit:  Lumbar radiculopathy  BMI 27.0-27.9,adult  Other orders -     pregabalin (LYRICA) 300 MG capsule; Take 1 capsule (300 mg total) by mouth at bedtime. -     Discontinue: oxyCODONE-acetaminophen (PERCOCET/ROXICET) 5-325 MG tablet; One in AM, two in the afternoon and two at bedtime -     oxyCODONE-acetaminophen (PERCOCET/ROXICET) 5-325 MG tablet; One in AM, two in the afternoon and two at bedtime    Virtual Visit via telephone Note  I discussed the limitations, risks, security and privacy concerns of performing an evaluation and management service by telephone and the availability of in person appointments. The patient was identified with two identifiers. Pt.expressed understanding and agreed to proceed. Pt. Is  at home. Dr. Livia Snellen is in his office.  Follow Up Instructions:   I discussed the assessment and treatment plan with the patient. The patient was provided an opportunity to ask questions and all were answered. The patient agreed with the plan and demonstrated an understanding of the instructions.   The patient was advised to call back or seek an in-person evaluation if the symptoms worsen or if the condition fails to improve as anticipated.   Total minutes including chart review and phone contact time: 15   Follow up plan: Return in about 2 months (around 10/30/2018).  Claretta Fraise, MD [place]

## 2018-09-16 ENCOUNTER — Other Ambulatory Visit: Payer: Self-pay | Admitting: Family Medicine

## 2018-09-16 NOTE — Telephone Encounter (Signed)
Seen 6/29

## 2018-09-17 ENCOUNTER — Other Ambulatory Visit: Payer: Self-pay

## 2018-09-17 MED ORDER — PREGABALIN 300 MG PO CAPS: 300.0000 mg | ORAL_CAPSULE | Freq: Every day | ORAL | 0 refills | Status: DC

## 2018-09-17 MED ORDER — DILTIAZEM HCL ER COATED BEADS 300 MG PO CP24: 300.0000 mg | ORAL_CAPSULE | Freq: Every day | ORAL | 0 refills | Status: DC

## 2018-09-17 NOTE — Telephone Encounter (Signed)
Received fax from pharmacy wanting a 90 day rx for Lyrica. It was sent in in June for #30 with 2RFs. Can this be changed?

## 2018-09-17 NOTE — Telephone Encounter (Signed)
Please review and advise.

## 2018-09-17 NOTE — Telephone Encounter (Signed)
Technically, since this is a controlled, it should be modified by Dr Livia Snellen.  I will defer to him when he returns.

## 2018-09-22 ENCOUNTER — Telehealth: Payer: Self-pay | Admitting: Family Medicine

## 2018-09-27 ENCOUNTER — Other Ambulatory Visit: Payer: Self-pay | Admitting: *Deleted

## 2018-09-28 MED ORDER — CHLORTHALIDONE 25 MG PO TABS: ORAL_TABLET | ORAL | 0 refills | Status: DC

## 2018-10-11 ENCOUNTER — Telehealth: Payer: Self-pay | Admitting: Family Medicine

## 2018-10-11 ENCOUNTER — Other Ambulatory Visit: Payer: Self-pay

## 2018-10-12 ENCOUNTER — Ambulatory Visit (INDEPENDENT_AMBULATORY_CARE_PROVIDER_SITE_OTHER): Payer: BC Managed Care – PPO | Admitting: Family Medicine

## 2018-10-12 ENCOUNTER — Other Ambulatory Visit: Payer: Self-pay | Admitting: *Deleted

## 2018-10-12 ENCOUNTER — Encounter: Payer: Self-pay | Admitting: Family Medicine

## 2018-10-12 VITALS — BP 131/78 | HR 80 | Temp 97.8°F | Ht 66.0 in | Wt 171.0 lb

## 2018-10-12 DIAGNOSIS — I10 Essential (primary) hypertension: Secondary | ICD-10-CM | POA: Diagnosis not present

## 2018-10-12 DIAGNOSIS — H6690 Otitis media, unspecified, unspecified ear: Secondary | ICD-10-CM

## 2018-10-12 DIAGNOSIS — G8929 Other chronic pain: Secondary | ICD-10-CM

## 2018-10-12 DIAGNOSIS — F329 Major depressive disorder, single episode, unspecified: Secondary | ICD-10-CM

## 2018-10-12 DIAGNOSIS — K219 Gastro-esophageal reflux disease without esophagitis: Secondary | ICD-10-CM

## 2018-10-12 DIAGNOSIS — F32A Depression, unspecified: Secondary | ICD-10-CM

## 2018-10-12 MED ORDER — PANTOPRAZOLE SODIUM 40 MG PO TBEC: 40.0000 mg | DELAYED_RELEASE_TABLET | Freq: Two times a day (BID) | ORAL | 1 refills | Status: DC

## 2018-10-12 MED ORDER — OXYCODONE-ACETAMINOPHEN 5-325 MG PO TABS: ORAL_TABLET | ORAL | 0 refills | Status: AC

## 2018-10-12 MED ORDER — BUPROPION HCL ER (XL) 300 MG PO TB24: 300.0000 mg | ORAL_TABLET | Freq: Every day | ORAL | 1 refills | Status: DC

## 2018-10-12 MED ORDER — DILTIAZEM HCL ER COATED BEADS 300 MG PO CP24: 300.0000 mg | ORAL_CAPSULE | Freq: Every day | ORAL | 0 refills | Status: DC

## 2018-10-12 MED ORDER — PREGABALIN 300 MG PO CAPS: 300.0000 mg | ORAL_CAPSULE | Freq: Every day | ORAL | 0 refills | Status: DC

## 2018-10-12 MED ORDER — POTASSIUM CHLORIDE ER 10 MEQ PO TBCR: 10.0000 meq | EXTENDED_RELEASE_TABLET | Freq: Every day | ORAL | 1 refills | Status: DC

## 2018-10-12 MED ORDER — OXYCODONE-ACETAMINOPHEN 5-325 MG PO TABS: ORAL_TABLET | ORAL | 0 refills | Status: DC

## 2018-10-12 MED ORDER — TIZANIDINE HCL 4 MG PO TABS: 4.0000 mg | ORAL_TABLET | Freq: Three times a day (TID) | ORAL | 0 refills | Status: DC

## 2018-10-12 MED ORDER — CHLORTHALIDONE 25 MG PO TABS: ORAL_TABLET | ORAL | 0 refills | Status: DC

## 2018-10-12 MED ORDER — FLUTICASONE PROPIONATE 50 MCG/ACT NA SUSP: 2.0000 | Freq: Every day | NASAL | 1 refills | Status: DC

## 2018-10-12 NOTE — Progress Notes (Signed)
Subjective:  Patient ID: Lisa Parrish, female    DOB: [birth year]  Age: 58 y.o. MRN: [id number]  CC: Medical Management of Chronic Issues, Hypertension, and Pain   HPI Lisa Parrish presents for recheck of chronic low back pain.  It is not radiating to the legs at this time.  It is midline in both sides of the lower back.  Patient tells me that she is tolerating the Lyrica well.  However she continues to have a 6/10 pain.  This is mitigated somewhat by taking 5 oxycodones daily.  She will get a spike in her pain if she bends over at the waist.  Patient has multiple concerns about the safety of Lyrica.  These were reviewed with her in detail.  PDMP was reviewed and shows no discrepancy.  Morphine milligram equivalents he is 37.5.  Patient denies any side effects of use of the medications at this time.   Follow-up of hypertension. Patient has no history of headache chest pain or shortness of breath or recent cough. Patient also denies symptoms of TIA such as numbness weakness lateralizing. Patient checks  blood pressure at home and has not had any elevated readings recently. Patient denies side effects from his medication. States taking it regularly.   Depression screen [facility] 2/9 10/12/2018 07/27/2018 05/18/2018  Decreased Interest 0 0 0  Down, Depressed, Hopeless 0 0 0  PHQ - 2 Score 0 0 0    History Lisa Parrish has a past medical history of Allergy, Anxiety, Constipation, Depression, GERD (gastroesophageal reflux disease), Headache, migraine, Hyperlipidemia, Hypertension, Kidney stones, Osteoarthritis, Pulmonary HTN ([place]) (12/21/2014), S/P cardiac cath 12/20/14 normal coronary arteries (12/21/2014), and Seasonal allergies.   She has a past surgical history that includes Hemorrhoid surgery (1989); Tubal ligation (1990); lumbar disckectomy (2006); Cardiac catheterization (N/A, 12/20/2014); Colonoscopy; Polypectomy; and Lumbar fusion (2018).   Her family history includes Cancer in her mother;  Colon cancer in her paternal uncle; Colon cancer (age of onset: 66) in her paternal aunt and paternal uncle; Esophageal cancer in her brother; Heart disease in her father.She reports that she quit smoking about 27 years ago. Her smoking use included cigarettes. She has a 10.00 pack-year smoking history. She has never used smokeless tobacco. She reports that she does not drink alcohol or use drugs.    ROS Review of Systems  Constitutional: Negative.   HENT: Negative for congestion.   Eyes: Negative for visual disturbance.  Respiratory: Negative for shortness of breath.   Cardiovascular: Negative for chest pain.  Gastrointestinal: Negative for abdominal pain, constipation, diarrhea, nausea and vomiting.  Genitourinary: Negative for difficulty urinating.  Musculoskeletal: Positive for back pain. Negative for arthralgias and myalgias.  Neurological: Negative for headaches.  Psychiatric/Behavioral: Negative for sleep disturbance.    Objective:  BP 131/78   Pulse 80   Temp 97.8 F (36.6 C)   Ht '5\' 6"'  (1.676 m)   Wt 171 lb (77.6 kg)   BMI 27.60 kg/m   BP Readings from Last 3 Encounters:  10/12/18 131/78  07/27/18 (!) 151/103  05/18/18 (!) 160/99    Wt Readings from Last 3 Encounters:  10/12/18 171 lb (77.6 kg)  07/27/18 170 lb (77.1 kg)  05/18/18 169 lb 8 oz (76.9 kg)     Physical Exam Constitutional:      General: She is not in acute distress.    Appearance: She is well-developed.  Cardiovascular:     Rate and Rhythm: Normal rate and regular rhythm.  Pulmonary:     Breath  sounds: Normal breath sounds.  Skin:    General: Skin is warm and dry.  Neurological:     Mental Status: She is alert and oriented to person, place, and time.       Assessment & Plan:   Kathia was seen today for medical management of chronic issues, hypertension and pain.  Diagnoses and all orders for this visit:  Essential hypertension -     Cancel: CMP14+EGFR  Other chronic pain -      ToxASSURE Select 13 (MW), Urine  Chronic otitis media, unspecified otitis media type -     fluticasone (FLONASE) 50 MCG/ACT nasal spray; Place 2 sprays into both nostrils daily.  Depression, unspecified depression type -     buPROPion (WELLBUTRIN XL) 300 MG 24 hr tablet; Take 1 tablet (300 mg total) by mouth daily.  Gastroesophageal reflux disease without esophagitis -     pantoprazole (PROTONIX) 40 MG tablet; Take 1 tablet (40 mg total) by mouth 2 (two) times daily before a meal.  Other orders -     tiZANidine (ZANAFLEX) 4 MG tablet; Take 1 tablet (4 mg total) by mouth 3 (three) times daily. -     potassium chloride (K-DUR) 10 MEQ tablet; Take 1 tablet (10 mEq total) by mouth daily. -     pregabalin (LYRICA) 300 MG capsule; Take 1 capsule (300 mg total) by mouth at bedtime. -     oxyCODONE-acetaminophen (PERCOCET/ROXICET) 5-325 MG tablet; One in AM, two in the afternoon and two at bedtime -     diltiazem (CARDIZEM CD) 300 MG 24 hr capsule; Take 1 capsule (300 mg total) by mouth daily. For blood pressure -     chlorthalidone (HYGROTON) 25 MG tablet; TAKE 1/2 TABLETS (12.5 MG TOTAL) BY MOUTH 2 (TWO) TIMES DAILY. -     oxyCODONE-acetaminophen (PERCOCET) 5-325 MG tablet; One each morning, two each afternoon and two at bedtime -     oxyCODONE-acetaminophen (PERCOCET) 5-325 MG tablet; One each morning, two each afternoon and two at bedtime       I have changed Lisa Parrish's fluticasone, buPROPion, and potassium chloride. I am also having her start on oxyCODONE-acetaminophen and oxyCODONE-acetaminophen. Additionally, I am having her maintain her Multiple Vitamins-Minerals (HAIR/SKIN/NAILS PO), vitamin C, Magnesium, b complex vitamins, Polyethylene Glycol 3350 (MIRALAX PO), azelastine, pantoprazole, tiZANidine, pregabalin, oxyCODONE-acetaminophen, diltiazem, and chlorthalidone.  Allergies as of 10/12/2018      Reactions   Gabapentin Anxiety   Intense, alarming dreams   Amlodipine  Swelling   Hydrocodone    Norco   Morphine And Related Nausea And Vomiting   Promethazine    Burns through the IV   Tramadol Other (See Comments)   Upset stomach      Medication List       Accurate as of October 12, 2018  5:53 PM. If you have any questions, ask your nurse or doctor.        azelastine 0.05 % ophthalmic solution Commonly known as: OPTIVAR PLACE 1 DROP INTO EACH EYE ONCE DAILY AS NEEDED FOR ALLERGIES   b complex vitamins capsule Take 1 capsule by mouth daily.   buPROPion 300 MG 24 hr tablet Commonly known as: WELLBUTRIN XL Take 1 tablet (300 mg total) by mouth daily.   chlorthalidone 25 MG tablet Commonly known as: HYGROTON TAKE 1/2 TABLETS (12.5 MG TOTAL) BY MOUTH 2 (TWO) TIMES DAILY.   diltiazem 300 MG 24 hr capsule Commonly known as: CARDIZEM CD Take 1 capsule (300 mg total) by mouth  daily. For blood pressure   fluticasone 50 MCG/ACT nasal spray Commonly known as: FLONASE Place 2 sprays into both nostrils daily.   HAIR/SKIN/NAILS PO Take 1 tablet by mouth daily. Reported on 03/19/2015   Magnesium 250 MG Tabs Take by mouth.   MIRALAX PO 2-3 times a week   oxyCODONE-acetaminophen 5-325 MG tablet Commonly known as: Percocet One each morning, two each afternoon and two at bedtime Start taking on: October 29, 2018 What changed: You were already taking a medication with the same name, and this prescription was added. Make sure you understand how and when to take each. Changed by: Claretta Fraise, MD   oxyCODONE-acetaminophen 5-325 MG tablet Commonly known as: PERCOCET/ROXICET One in AM, two in the afternoon and two at bedtime Start taking on: November 28, 2018 What changed: These instructions start on November 28, 2018. If you are unsure what to do until then, ask your doctor or other care provider. Changed by: Claretta Fraise, MD   oxyCODONE-acetaminophen 5-325 MG tablet Commonly known as: Percocet One each morning, two each afternoon and two at  bedtime Start taking on: December 28, 2018 What changed: You were already taking a medication with the same name, and this prescription was added. Make sure you understand how and when to take each. Changed by: Claretta Fraise, MD   pantoprazole 40 MG tablet Commonly known as: PROTONIX Take 1 tablet (40 mg total) by mouth 2 (two) times daily before a meal.   potassium chloride 10 MEQ tablet Commonly known as: K-DUR Take 1 tablet (10 mEq total) by mouth daily.   pregabalin 300 MG capsule Commonly known as: LYRICA Take 1 capsule (300 mg total) by mouth at bedtime.   tiZANidine 4 MG tablet Commonly known as: ZANAFLEX Take 1 tablet (4 mg total) by mouth 3 (three) times daily.   vitamin C 1000 MG tablet Take 1,000 mg by mouth daily.        Follow-up: Return in about 3 months (around 01/12/2019).  Claretta Fraise, M.D.

## 2018-10-13 NOTE — Addendum Note (Signed)
Addended by: Earlene Plater on: 10/13/2018 11:37 AM   Modules accepted: Orders

## 2018-10-14 ENCOUNTER — Other Ambulatory Visit: Payer: Self-pay | Admitting: *Deleted

## 2018-10-14 ENCOUNTER — Telehealth: Payer: Self-pay | Admitting: Family Medicine

## 2018-10-14 LAB — CMP14+EGFR
ALT: 25 IU/L (ref 0–32)
AST: 24 IU/L (ref 0–40)
Albumin/Globulin Ratio: 2.3 — ABNORMAL HIGH (ref 1.2–2.2)
Albumin: 5 g/dL — ABNORMAL HIGH (ref 3.8–4.9)
Alkaline Phosphatase: 100 IU/L (ref 39–117)
BUN/Creatinine Ratio: 29 — ABNORMAL HIGH (ref 9–23)
BUN: 22 mg/dL (ref 6–24)
Bilirubin Total: 0.2 mg/dL (ref 0.0–1.2)
CO2: 25 mmol/L (ref 20–29)
Calcium: 9.7 mg/dL (ref 8.7–10.2)
Chloride: 98 mmol/L (ref 96–106)
Creatinine, Ser: 0.75 mg/dL (ref 0.57–1.00)
GFR calc Af Amer: 102 mL/min/{1.73_m2} (ref >59)
GFR calc non Af Amer: 88 mL/min/{1.73_m2} (ref >59)
Globulin, Total: 2.2 g/dL (ref 1.5–4.5)
Glucose: 97 mg/dL (ref 65–99)
Potassium: 3.9 mmol/L (ref 3.5–5.2)
Sodium: 138 mmol/L (ref 134–144)
Total Protein: 7.2 g/dL (ref 6.0–8.5)

## 2018-10-14 NOTE — Progress Notes (Signed)
Hello Lisa Parrish,  Your lab result is normal and/or stable.Some minor variations that are not significant are commonly marked abnormal, but do not represent any medical problem for you.  Best regards, Claretta Fraise, M.D.

## 2018-10-14 NOTE — Telephone Encounter (Signed)
Please  write and I will sign. Thanks, WS 

## 2018-10-14 NOTE — Telephone Encounter (Signed)
Patient states that Dr. Livia Snellen does this note every year.  Monday- Friday 8-5 40 hours a week due to her back pain. No nights or weekends. Please advise

## 2018-10-14 NOTE — Telephone Encounter (Signed)
Aware, letter ready.

## 2018-10-15 LAB — TOXASSURE SELECT 13 (MW), URINE

## 2018-11-09 ENCOUNTER — Encounter: Payer: Self-pay | Admitting: Family Medicine

## 2018-11-09 ENCOUNTER — Other Ambulatory Visit: Payer: Self-pay | Admitting: *Deleted

## 2018-11-09 MED ORDER — POTASSIUM CHLORIDE ER 10 MEQ PO TBCR: 10.0000 meq | EXTENDED_RELEASE_TABLET | Freq: Every day | ORAL | 1 refills | Status: DC

## 2018-11-10 ENCOUNTER — Other Ambulatory Visit: Payer: Self-pay | Admitting: *Deleted

## 2018-11-10 MED ORDER — POTASSIUM CHLORIDE ER 10 MEQ PO TBCR: 10.0000 meq | EXTENDED_RELEASE_TABLET | Freq: Every day | ORAL | 1 refills | Status: DC

## 2018-11-10 NOTE — Telephone Encounter (Signed)
Refill image not visible, resent

## 2018-11-15 ENCOUNTER — Encounter: Payer: Self-pay | Admitting: Family Medicine

## 2018-11-15 NOTE — Telephone Encounter (Signed)
Okay to write the note. Just get an idea from her how often she needs to sit when she says, "periodically." Thanks, WS

## 2018-12-13 ENCOUNTER — Encounter: Payer: Self-pay | Admitting: Family Medicine

## 2018-12-14 ENCOUNTER — Encounter: Payer: Self-pay | Admitting: Family Medicine

## 2018-12-28 ENCOUNTER — Other Ambulatory Visit: Payer: Self-pay | Admitting: Family Medicine

## 2019-01-03 ENCOUNTER — Ambulatory Visit (INDEPENDENT_AMBULATORY_CARE_PROVIDER_SITE_OTHER): Payer: BC Managed Care – PPO | Admitting: Family Medicine

## 2019-01-03 ENCOUNTER — Encounter: Payer: Self-pay | Admitting: Family Medicine

## 2019-01-03 DIAGNOSIS — M5416 Radiculopathy, lumbar region: Secondary | ICD-10-CM

## 2019-01-03 MED ORDER — OXYCODONE-ACETAMINOPHEN 5-325 MG PO TABS: ORAL_TABLET | ORAL | 0 refills | Status: DC

## 2019-01-03 NOTE — Progress Notes (Addendum)
Subjective:    Patient ID: Lisa Parrish, female    DOB: February 07, 1961, 58 y.o.   MRN: [id number]   HPI: Lisa Parrish is a 58 y.o. female presenting for  Pain assessment: Cause of pain- lumbar radiculopathy  Pain location- very bottom of back at buttocks, biterally. no radiation,  Pain on scale of 1-10- 6-7 Frequency- daily What increases pain-bending, laying down - unless she props a leg on pillows What makes pain Better-none Effects on ADL - none Any change in general medical condition-none  Current opioids rx- percocet 5/325, 5 times a day # meds rx- both sides Effectiveness of current meds-good Adverse reactions form pain meds-denies Morphine equivalent- 37.5  Pill count performed-No Last drug screen - 3 mos. ( high risk q68m, moderate risk q76m, low risk yearly ) Urine drug screen today- No Was the [place] reviewed- yes  If yes were their any concerning findings? - no.   Depression screen [facility] 2/9 10/12/2018 07/27/2018 05/18/2018 03/04/2018 12/21/2017  Decreased Interest 0 0 0 0 0  Down, Depressed, Hopeless 0 0 0 0 0  PHQ - 2 Score 0 0 0 0 0     Relevant past medical, surgical, family and social history reviewed and updated as indicated.  Interim medical history since our last visit reviewed. Allergies and medications reviewed and updated.  ROS:  Review of Systems  Constitutional: Negative.   HENT: Negative.   Eyes: Negative for visual disturbance.  Respiratory: Negative for shortness of breath.   Cardiovascular: Negative for chest pain.  Gastrointestinal: Negative for abdominal pain.  Musculoskeletal: Positive for arthralgias and back pain.     Social History   Tobacco Use  Smoking Status Former Smoker  . Packs/day: 1.00  . Years: 10.00  . Pack years: 10.00  . Types: Cigarettes  . Quit date: 09/18/1991  . Years since quitting: 27.3  Smokeless Tobacco Never Used       Objective:     Wt Readings from Last 3 Encounters:  10/12/18 171 lb  (77.6 kg)  07/27/18 170 lb (77.1 kg)  05/18/18 169 lb 8 oz (76.9 kg)     Exam deferred. Pt. [place] due to COVID 19. Phone visit performed.   Assessment & Plan:   1. Lumbar radiculopathy     Meds ordered this encounter  Medications  . oxyCODONE-acetaminophen (PERCOCET) 5-325 MG tablet    Sig: One each morning, two each afternoon and two at bedtime    Dispense:  150 tablet    Refill:  0  . oxyCODONE-acetaminophen (PERCOCET) 5-325 MG tablet    Sig: One each morning, two each afternoon and two at bedtime    Dispense:  150 tablet    Refill:  0  . oxyCODONE-acetaminophen (PERCOCET) 5-325 MG tablet    Sig: One each morning, two each afternoon and two at bedtime    Dispense:  150 tablet    Refill:  0    No orders of the defined types were placed in this encounter.     Diagnoses and all orders for this visit:  Lumbar radiculopathy -     oxyCODONE-acetaminophen (PERCOCET) 5-325 MG tablet; One each morning, two each afternoon and two at bedtime  Other orders -     oxyCODONE-acetaminophen (PERCOCET) 5-325 MG tablet; One each morning, two each afternoon and two at bedtime -     oxyCODONE-acetaminophen (PERCOCET) 5-325 MG tablet; One each morning, two each afternoon and two at bedtime    Virtual Visit via telephone Note  I discussed the limitations, risks, security and privacy concerns of performing an evaluation and management service by telephone and the availability of in person appointments. The patient was identified with two identifiers. Pt.expressed understanding and agreed to proceed. Pt. Is at home. Dr. Livia Snellen is in his office.  Follow Up Instructions:   I discussed the assessment and treatment plan with the patient. The patient was provided an opportunity to ask questions and all were answered. The patient agreed with the plan and demonstrated an understanding of the instructions.   The patient was advised to call back or seek an in-person evaluation if the symptoms  worsen or if the condition fails to improve as anticipated.   Total minutes including chart review and phone contact time: 15   Follow up plan: Return in about 3 months (around 04/05/2019) for Pain.  Claretta Fraise, MD [place]

## 2019-01-03 NOTE — Addendum Note (Signed)
Addended by: Claretta Fraise on: 01/03/2019 11:28 AM   Modules accepted: Orders

## 2019-01-05 DIAGNOSIS — Z029 Encounter for administrative examinations, unspecified: Secondary | ICD-10-CM

## 2019-01-11 ENCOUNTER — Encounter: Payer: Self-pay | Admitting: Family Medicine

## 2019-01-11 MED ORDER — ESCITALOPRAM OXALATE 10 MG PO TABS: 10.0000 mg | ORAL_TABLET | Freq: Every day | ORAL | 1 refills | Status: DC

## 2019-01-12 ENCOUNTER — Ambulatory Visit: Payer: BC Managed Care – PPO | Admitting: Family Medicine

## 2019-01-18 ENCOUNTER — Other Ambulatory Visit: Payer: Self-pay

## 2019-01-19 ENCOUNTER — Ambulatory Visit: Payer: BC Managed Care – PPO | Admitting: Family Medicine

## 2019-01-19 ENCOUNTER — Encounter: Payer: Self-pay | Admitting: Family Medicine

## 2019-01-19 VITALS — BP 156/96 | HR 73 | Temp 98.6°F | Resp 20 | Ht 66.0 in | Wt 174.0 lb

## 2019-01-19 DIAGNOSIS — I10 Essential (primary) hypertension: Secondary | ICD-10-CM | POA: Diagnosis not present

## 2019-01-19 DIAGNOSIS — R040 Epistaxis: Secondary | ICD-10-CM | POA: Diagnosis not present

## 2019-01-19 MED ORDER — METOPROLOL SUCCINATE ER 50 MG PO TB24: 50.0000 mg | ORAL_TABLET | Freq: Every day | ORAL | 2 refills | Status: DC

## 2019-01-19 NOTE — Progress Notes (Signed)
Chief Complaint  Patient presents with  . Epistaxis    8 nose bleeds in last 16 days    HPI  Patient presents today for nose bleeds from the right nare 8 times in 16 days. They last 2-3 minutes and respond to direct pressure when pt. Pinches her nose.   PMH: Smoking status noted [id number] of Systems  Constitutional: Negative.   HENT: Negative.   Eyes: Negative for visual disturbance.  Respiratory: Negative for shortness of breath.   Cardiovascular: Negative for chest pain.  Gastrointestinal: Negative for abdominal pain.  Musculoskeletal: Negative for arthralgias.    Objective: BP (!) 156/96   Pulse 73   Temp 98.6 F (37 C) (Temporal)   Resp 20   Ht 5\' 6"  (1.676 m)   Wt 174 lb (78.9 kg)   SpO2 97%   BMI 28.08 kg/m  Gen: NAD, alert, cooperative with exam HEENT: NCAT, EOMI, PERRL CV: RRR, good S1/S2, no murmur Resp: CTABL, no wheezes, non-labored Abd: SNTND, BS present, no guarding or organomegaly Ext: No edema, warm Neuro: Alert and oriented, No gross deficits  Assessment and plan:  1. Right-sided epistaxis   2. Essential hypertension     Meds ordered this encounter  Medications  . metoprolol succinate (TOPROL-XL) 50 MG 24 hr tablet    Sig: Take 1 tablet (50 mg total) by mouth daily. For blood pressure control    Dispense:  30 tablet    Refill:  2    No orders of the defined types were placed in this encounter.   Follow up as needed.  Claretta Fraise, MD

## 2019-02-17 ENCOUNTER — Other Ambulatory Visit: Payer: Self-pay | Admitting: Family Medicine

## 2019-03-06 ENCOUNTER — Other Ambulatory Visit: Payer: Self-pay | Admitting: Family Medicine

## 2019-03-09 ENCOUNTER — Telehealth: Payer: Self-pay | Admitting: Cardiovascular Disease

## 2019-03-09 NOTE — Telephone Encounter (Signed)
Virtual Visit Pre-Appointment Phone Call  "(Name), I am calling you today to discuss your upcoming appointment. We are currently trying to limit exposure to the virus that causes COVID-19 by seeing patients at home rather than in the office."  1. "What is the BEST phone number to call the day of the visit?" - include this in appointment notes  2. Do you have or have access to (through a family member/friend) a smartphone with video capability that we can use for your visit?" a. If yes - list this number in appt notes as cell (if different from BEST phone #) and list the appointment type as a VIDEO visit in appointment notes b. If no - list the appointment type as a PHONE visit in appointment notes  Confirm consent - "In the setting of the current Covid19 crisis, you are scheduled for a (phone or video) visit with your provider on (date) at (time).  Just as we do with many in-office visits, in order for you to participate in this visit, we must obtain consent.  If you'd like, I can send this to your mychart (if signed up) or email for you to review.  Otherwise, I can obtain your verbal consent now.  All virtual visits are billed to your insurance company just like a normal visit would be.  By agreeing to a virtual visit, we'd like you to understand that the technology does not allow for your provider to perform an examination, and thus may limit your provider's ability to fully assess your condition. If your provider identifies any concerns that need to be evaluated in person, we will make arrangements to do so.  Finally, though the technology is pretty good, we cannot assure that it will always work on either your or our end, and in the setting of a video visit, we may have to convert it to a phone-only visit.  In either situation, we cannot ensure that we have a secure connection.  Are you willing to proceed?" STAFF: Did the patient verbally acknowledge consent to telehealth visit? Document  YES/NO here: yes 3. Advise patient to be prepared - "Two hours prior to your appointment, go ahead and check your blood pressure, pulse, oxygen saturation, and your weight (if you have the equipment to check those) and write them all down. When your visit starts, your provider will ask you for this information. If you have an Apple Watch or Kardia device, please plan to have heart rate information ready on the day of your appointment. Please have a pen and paper handy nearby the day of the visit as well."  4. Give patient instructions for MyChart download to smartphone OR Doximity/Doxy.me as below if video visit (depending on what platform provider is using)  5. Inform patient they will receive a phone call 15 minutes prior to their appointment time (may be from unknown caller ID) so they should be prepared to answer    TELEPHONE CALL NOTE  Lisa Parrish has been deemed a candidate for a follow-up tele-health visit to limit community exposure during the Covid-19 pandemic. I spoke with the patient via phone to ensure availability of phone/video source, confirm preferred email & phone number, and discuss instructions and expectations.  I reminded Lisa Parrish to be prepared with any vital sign and/or heart rhythm information that could potentially be obtained via home monitoring, at the time of her visit. I reminded Lisa Parrish to expect a phone call prior to her visit.  Ailene Rud  Tamala Julian 03/09/2019 9:11 AM   INSTRUCTIONS FOR DOWNLOADING THE MYCHART APP TO SMARTPHONE  - The patient must first make sure to have activated MyChart and know their login information - If Apple, go to [employer] and type in MyChart in the search bar and download the app. If Android, ask patient to go to [employer] and type in [place] in the search bar and download the app. The app is free but as with any other app downloads, their phone may require them to verify saved payment information or  Apple/Android password.  - The patient will need to then log into the app with their MyChart username and password, and select [facility] as their healthcare provider to link the account. When it is time for your visit, go to the MyChart app, find appointments, and click Begin Video Visit. Be sure to Select Allow for your device to access the Microphone and Camera for your visit. You will then be connected, and your provider will be with you shortly.  **If they have any issues connecting, or need assistance please contact MyChart service desk [phone])**  **If using a computer, in order to ensure the best quality for their visit they will need to use either of the following Internet Browsers: [employer], or Google Chrome**  IF USING DOXIMITY or DOXY.ME - The patient will receive a link just prior to their visit by text.     FULL LENGTH CONSENT FOR TELE-HEALTH VISIT   I hereby voluntarily request, consent and authorize [facility] and its employed or contracted physicians, physician assistants, nurse practitioners or other licensed health care professionals (the Practitioner), to provide me with telemedicine health care services (the Services") as deemed necessary by the treating Practitioner. I acknowledge and consent to receive the Services by the Practitioner via telemedicine. I understand that the telemedicine visit will involve communicating with the Practitioner through live audiovisual communication technology and the disclosure of certain medical information by electronic transmission. I acknowledge that I have been given the opportunity to request an in-person assessment or other available alternative prior to the telemedicine visit and am voluntarily participating in the telemedicine visit.  I understand that I have the right to withhold or withdraw my consent to the use of telemedicine in the course of my care at any time, without affecting my right to future care  or treatment, and that the Practitioner or I may terminate the telemedicine visit at any time. I understand that I have the right to inspect all information obtained and/or recorded in the course of the telemedicine visit and may receive copies of available information for a reasonable fee.  I understand that some of the potential risks of receiving the Services via telemedicine include:   Delay or interruption in medical evaluation due to technological equipment failure or disruption;  Information transmitted may not be sufficient (e.g. poor resolution of images) to allow for appropriate medical decision making by the Practitioner; and/or   In rare instances, security protocols could fail, causing a breach of personal health information.  Furthermore, I acknowledge that it is my responsibility to provide information about my medical history, conditions and care that is complete and accurate to the best of my ability. I acknowledge that Practitioner's advice, recommendations, and/or decision may be based on factors not within their control, such as incomplete or inaccurate data provided by me or distortions of diagnostic images or specimens that may result from electronic transmissions. I understand that the practice of  medicine is not an Chief Strategy Officer and that Practitioner makes no warranties or guarantees regarding treatment outcomes. I acknowledge that I will receive a copy of this consent concurrently upon execution via email to the email address I last provided but may also request a printed copy by calling the office of [facility].    I understand that my insurance will be billed for this visit.   I have read or had this consent read to me.  I understand the contents of this consent, which adequately explains the benefits and risks of the Services being provided via telemedicine.   I have been provided ample opportunity to ask questions regarding this consent and the Services and have had  my questions answered to my satisfaction.  I give my informed consent for the services to be provided through the use of telemedicine in my medical care  By participating in this telemedicine visit I agree to the above.

## 2019-03-11 ENCOUNTER — Encounter: Payer: Self-pay | Admitting: Cardiovascular Disease

## 2019-03-11 ENCOUNTER — Telehealth (INDEPENDENT_AMBULATORY_CARE_PROVIDER_SITE_OTHER): Payer: BLUE CROSS/BLUE SHIELD | Admitting: Cardiovascular Disease

## 2019-03-11 VITALS — BP 137/83 | HR 83 | Ht 66.0 in | Wt 175.0 lb

## 2019-03-11 DIAGNOSIS — I10 Essential (primary) hypertension: Secondary | ICD-10-CM

## 2019-03-11 DIAGNOSIS — E785 Hyperlipidemia, unspecified: Secondary | ICD-10-CM

## 2019-03-11 DIAGNOSIS — R600 Localized edema: Secondary | ICD-10-CM

## 2019-03-11 MED ORDER — HYDRALAZINE HCL 25 MG PO TABS: 25.0000 mg | ORAL_TABLET | Freq: Three times a day (TID) | ORAL | 1 refills | Status: DC

## 2019-03-11 NOTE — Progress Notes (Signed)
Virtual Visit via Telephone Note   This visit type was conducted due to national recommendations for restrictions regarding the COVID-19 Pandemic (e.g. social distancing) in an effort to limit this patient's exposure and mitigate transmission in our community.  Due to her co-morbid illnesses, this patient is at least at moderate risk for complications without adequate follow up.  This format is felt to be most appropriate for this patient at this time.  The patient did not have access to video technology/had technical difficulties with video requiring transitioning to audio format only (telephone).  All issues noted in this document were discussed and addressed.  No physical exam could be performed with this format.  Please refer to the patient's chart for her  consent to telehealth for [facility].   Date:  03/11/2019   ID:  Lisa Parrish, DOB 11/01/1960, MRN [id number]  Patient Location: Home Provider Location: Home  PCP:  Claretta Fraise, MD  Cardiologist:  Kate Sable, MD  Electrophysiologist:  None   Evaluation Performed:  Follow-Up Visit  Chief Complaint:  HTN  History of Present Illness:    Lisa Parrish is a 59 y.o. female with past medical history of HTN, HLD, and normal cors by cardiac catheterization in 2016.  She's been having swelling in both legs from her knees to her feet.  She denies chest pain, palpitations, and shortness of breath.  She works at [employer] in [place].  Past Medical History:  Diagnosis Date  . Allergy   . Anxiety   . Constipation    due to pain meds   . Depression   . GERD (gastroesophageal reflux disease)   . Headache, migraine   . Hyperlipidemia   . Hypertension   . Kidney stones   . Osteoarthritis    back  . Pulmonary HTN ([place]) 12/21/2014  . S/P cardiac cath 12/20/14 normal coronary arteries 12/21/2014  . Seasonal allergies    Past Surgical History:  Procedure Laterality Date  . CARDIAC CATHETERIZATION N/A  12/20/2014   Procedure: Left Heart Cath and Coronary Angiography;  Surgeon: Sherren Mocha, MD;  Location: [facility];  Service: Cardiovascular;  Laterality: N/A;  . COLONOSCOPY    . [place]  . lumbar disckectomy  2006  . LUMBAR FUSION  2018  . POLYPECTOMY    . TUBAL LIGATION  1990     Current Meds  Medication Sig  . Ascorbic Acid (VITAMIN C) 1000 MG tablet Take 1,000 mg by mouth daily.  Marland Kitchen azelastine (OPTIVAR) 0.05 % ophthalmic solution PLACE 1 DROP INTO EACH EYE ONCE DAILY AS NEEDED FOR ALLERGIES  . b complex vitamins capsule Take 1 capsule by mouth daily.  Marland Kitchen buPROPion (WELLBUTRIN XL) 300 MG 24 hr tablet Take 1 tablet (300 mg total) by mouth daily.  . chlorthalidone (HYGROTON) 25 MG tablet TAKE 1/2 TABLET (12.5 MG TOTAL) BY MOUTH 2 (TWO) TIMES DAILY.  Marland Kitchen diltiazem (CARDIZEM CD) 300 MG 24 hr capsule TAKE 1 CAPSULE (300 MG TOTAL) BY MOUTH DAILY. FOR BLOOD PRESSURE  . escitalopram (LEXAPRO) 10 MG tablet Take 1 tablet (10 mg total) by mouth daily.  . Magnesium 250 MG TABS Take by mouth.  . metoprolol succinate (TOPROL-XL) 50 MG 24 hr tablet Take 1 tablet (50 mg total) by mouth daily. For blood pressure control  . Multiple Vitamins-Minerals (HAIR/SKIN/NAILS PO) Take 1 tablet by mouth daily. Reported on 03/19/2015  . oxyCODONE-acetaminophen (PERCOCET) 5-325 MG tablet One each morning, two each afternoon and two at bedtime  .  pantoprazole (PROTONIX) 40 MG tablet Take 1 tablet (40 mg total) by mouth 2 (two) times daily before a meal.  . Polyethylene Glycol 3350 (MIRALAX PO) 2-3 times a week  . potassium chloride (K-DUR) 10 MEQ tablet Take 1 tablet (10 mEq total) by mouth daily.  . pregabalin (LYRICA) 300 MG capsule TAKE 1 CAPSULE BY MOUTH EVERYDAY AT BEDTIME  . tiZANidine (ZANAFLEX) 4 MG tablet Take 1 tablet (4 mg total) by mouth 3 (three) times daily.     Allergies:   Gabapentin, Amlodipine, Hydrocodone, Morphine and related, Promethazine, and Tramadol   Social  History   Tobacco Use  . Smoking status: Former Smoker    Packs/day: 1.00    Years: 10.00    Pack years: 10.00    Types: Cigarettes    Quit date: 09/18/1991    Years since quitting: 27.4  . Smokeless tobacco: Never Used  Substance Use Topics  . Alcohol use: No  . Drug use: No    Comment: no IVDA     Family Hx: The patient's family history includes Cancer in her mother; Colon cancer in her paternal uncle; Colon cancer (age of onset: 4) in her paternal aunt and paternal uncle; Esophageal cancer in her brother; Heart disease in her father. There is no history of Colon polyps, Rectal cancer, or Stomach cancer.  ROS:   Please see the history of present illness.     All other systems reviewed and are negative.   Prior CV studies:   The following studies were reviewed today:  Echocardiogram: 10/08/2016 Study Conclusions  - Left ventricle: The cavity size was normal. Wall thickness was increased in a pattern of mild LVH. Systolic function was vigorous. The estimated ejection fraction was in the range of 65% to 70%. Wall motion was normal; there were no regional wall motion abnormalities. Doppler parameters are consistent with abnormal left ventricular relaxation (grade 1 diastolic dysfunction). - Aortic valve: Mildly calcified annulus. Normal thickness leaflets. Valve area (VTI): 3.11 cm^2. Valve area (Vmax): 2.67 cm^2. Valve area (Vmean): 2.43 cm^2. - Technically adequate study.  Cardiac Catheterization: 12/2014 1. Angiographically normal coronary arteries 2. Normal LV systolic function  Suspect noncardiac chest pain.   Labs/Other Tests and Data Reviewed:    EKG:  No ECG reviewed.  Recent Labs: 10/12/2018: ALT 25; BUN 22; Creatinine, Ser 0.75; Potassium 3.9; Sodium 138   Recent Lipid Panel Lab Results  Component Value Date/Time   CHOL 234 (H) 10/01/2017 04:13 PM   TRIG 229 (H) 10/01/2017 04:13 PM   HDL 48 10/01/2017 04:13 PM   CHOLHDL 4.9 (H)  10/01/2017 04:13 PM   CHOLHDL 3.7 02/27/2015 03:06 PM   LDLCALC 140 (H) 10/01/2017 04:13 PM   LDLDIRECT 124 01/18/2015 04:35 PM    Wt Readings from Last 3 Encounters:  03/11/19 175 lb (79.4 kg)  01/19/19 174 lb (78.9 kg)  10/12/18 171 lb (77.6 kg)     Objective:    Vital Signs:  BP 137/83   Pulse 83   Ht 5\' 6"  (1.676 m)   Wt 175 lb (79.4 kg)   BMI 28.25 kg/m    VITAL SIGNS:  reviewed  ASSESSMENT & PLAN:    1.  Hypertension: Blood pressure is normal today.  As she has bilateral leg edema, I wonder if diltiazem is the cause of this.  I will stop diltiazem and start hydralazine 25 mg 3 times daily.  2.  Hyperlipidemia: Followed by PCP.  Lipids reviewed above.  Not currently on statin  therapy.  3. Bilateral leg edema: This could be caused by diltiazem.  I will discontinue this and use alternative antihypertensive therapy as described #1.   COVID-19 Education: The signs and symptoms of COVID-19 were discussed with the patient and how to seek care for testing (follow up with PCP or arrange E-visit).  The importance of social distancing was discussed today.  Time:   Today, I have spent 15 minutes with the patient with telehealth technology discussing the above problems.     Medication Adjustments/Labs and Tests Ordered: Current medicines are reviewed at length with the patient today.  Concerns regarding medicines are outlined above.   Tests Ordered: No orders of the defined types were placed in this encounter.   Medication Changes: No orders of the defined types were placed in this encounter.   Follow Up:  Virtual Visit  in 3 month(s)  Signed, Kate Sable, MD  03/11/2019 2:00 PM    [place]

## 2019-03-11 NOTE — Addendum Note (Signed)
Addended by: Julian Hy T on: 03/11/2019 03:03 PM   Modules accepted: Orders

## 2019-03-11 NOTE — Patient Instructions (Signed)
Your physician recommends that you schedule a follow-up appointment in: 3-4 MONTHS WITH DR [place] physician has recommended you make the following change in your medication:   STOP DILTIAZEM  START HYDRALAZINE 25 MG THREE TIMES DAILY   Thank you for choosing [facility]!!

## 2019-03-25 ENCOUNTER — Ambulatory Visit: Payer: BC Managed Care – PPO | Admitting: Family Medicine

## 2019-03-27 ENCOUNTER — Encounter: Payer: Self-pay | Admitting: Family Medicine

## 2019-03-28 ENCOUNTER — Encounter: Payer: Self-pay | Admitting: Family Medicine

## 2019-03-28 ENCOUNTER — Ambulatory Visit (INDEPENDENT_AMBULATORY_CARE_PROVIDER_SITE_OTHER): Payer: BC Managed Care – PPO | Admitting: Family Medicine

## 2019-03-28 DIAGNOSIS — M5416 Radiculopathy, lumbar region: Secondary | ICD-10-CM | POA: Diagnosis not present

## 2019-03-28 MED ORDER — OXYCODONE-ACETAMINOPHEN 5-325 MG PO TABS: ORAL_TABLET | ORAL | 0 refills | Status: DC

## 2019-03-28 MED ORDER — PREGABALIN 300 MG PO CAPS: 300.0000 mg | ORAL_CAPSULE | Freq: Every day | ORAL | 1 refills | Status: DC

## 2019-03-28 MED ORDER — CLOBETASOL PROPIONATE 0.05 % EX CREA: 1.0000 "application " | TOPICAL_CREAM | Freq: Two times a day (BID) | CUTANEOUS | 2 refills | Status: DC

## 2019-03-28 NOTE — Progress Notes (Signed)
Subjective:    Patient ID: Lisa Parrish, female    DOB: 08-12-1960, 59 y.o.   MRN: [id number]   HPI: Magdalene Petzoldt is a 59 y.o. female presenting for   Pain assessment: Cause of pain-lumbar radiculopathy Pain location- lower back to buttocks Pain on scale of 1-10-7 Frequency-constant What increases pain-nothing noted What makes pain Better-heat or cold Effects on ADL - avoids lifting over 25 lb. Any change in general medical condition-none  Current opioids rx- oxycodone  # meds rx-5 daily for 1 month with 2 postdated prescriptions for a total of 450 tablets Effectiveness of current meds-adequate Adverse reactions form pain meds-denies Morphine equivalent-37.5  Pill count performed-No Last drug screen -10/12/2018 ( high risk q76m, moderate risk q41m, low risk yearly ) Urine drug screen today- No Was the [place] reviewed-yes  If yes were their any concerning findings? -None      Depression screen [facility] 2/9 01/19/2019 10/12/2018 07/27/2018 05/18/2018 03/04/2018  Decreased Interest 0 0 0 0 0  Down, Depressed, Hopeless 0 0 0 0 0  PHQ - 2 Score 0 0 0 0 0     Relevant past medical, surgical, family and social history reviewed and updated as indicated.  Interim medical history since our last visit reviewed. Allergies and medications reviewed and updated.  ROS:  Review of Systems  Constitutional: Negative.   HENT: Negative.   Respiratory: Negative for shortness of breath.   Cardiovascular: Negative for chest pain.  Gastrointestinal: Negative for abdominal pain, diarrhea, nausea and vomiting.  Genitourinary: Negative for difficulty urinating.  Musculoskeletal: Positive for arthralgias, back pain and myalgias.     Social History   Tobacco Use  Smoking Status Former Smoker  . Packs/day: 1.00  . Years: 10.00  . Pack years: 10.00  . Types: Cigarettes  . Quit date: 09/18/1991  . Years since quitting: 27.5  Smokeless Tobacco Never Used       Objective:      Wt Readings from Last 3 Encounters:  03/11/19 175 lb (79.4 kg)  01/19/19 174 lb (78.9 kg)  10/12/18 171 lb (77.6 kg)     Exam deferred. Pt. Harboring due to COVID 19. Phone visit performed.   Assessment & Plan:   1. Lumbar radiculopathy     Meds ordered this encounter  Medications  . clobetasol cream (TEMOVATE) 0.05 %    Sig: Apply 1 application topically 2 (two) times daily.    Dispense:  45 g    Refill:  2  . pregabalin (LYRICA) 300 MG capsule    Sig: Take 1 capsule (300 mg total) by mouth at bedtime.    Dispense:  90 capsule    Refill:  1    Not to exceed 5 additional fills before 03/16/2019  . oxyCODONE-acetaminophen (PERCOCET) 5-325 MG tablet    Sig: One each morning, two each afternoon and two at bedtime    Dispense:  150 tablet    Refill:  0  . oxyCODONE-acetaminophen (PERCOCET) 5-325 MG tablet    Sig: One each morning, two each afternoon and two at bedtime    Dispense:  150 tablet    Refill:  0  . oxyCODONE-acetaminophen (PERCOCET) 5-325 MG tablet    Sig: One each morning, two each afternoon and two at bedtime    Dispense:  150 tablet    Refill:  0    No orders of the defined types were placed in this encounter.     Diagnoses and all orders for this visit:  Lumbar radiculopathy -     oxyCODONE-acetaminophen (PERCOCET) 5-325 MG tablet; One each morning, two each afternoon and two at bedtime -     oxyCODONE-acetaminophen (PERCOCET) 5-325 MG tablet; One each morning, two each afternoon and two at bedtime -     oxyCODONE-acetaminophen (PERCOCET) 5-325 MG tablet; One each morning, two each afternoon and two at bedtime  Other orders -     clobetasol cream (TEMOVATE) 0.05 %; Apply 1 application topically 2 (two) times daily. -     pregabalin (LYRICA) 300 MG capsule; Take 1 capsule (300 mg total) by mouth at bedtime.    Virtual Visit via telephone Note  I discussed the limitations, risks, security and privacy concerns of performing an evaluation and  management service by telephone and the availability of in person appointments. The patient was identified with two identifiers. Pt.expressed understanding and agreed to proceed. Pt. Is at home. Dr. Livia Snellen is in his office.  Follow Up Instructions:   I discussed the assessment and treatment plan with the patient. The patient was provided an opportunity to ask questions and all were answered. The patient agreed with the plan and demonstrated an understanding of the instructions.   The patient was advised to call back or seek an in-person evaluation if the symptoms worsen or if the condition fails to improve as anticipated.   Total minutes including chart review and phone contact time: 21   Follow up plan: Return in about 3 months (around 06/26/2019).  Claretta Fraise, MD [place]

## 2019-04-07 ENCOUNTER — Other Ambulatory Visit: Payer: Self-pay | Admitting: Family Medicine

## 2019-04-07 ENCOUNTER — Other Ambulatory Visit: Payer: Self-pay | Admitting: Nurse Practitioner

## 2019-04-07 DIAGNOSIS — K219 Gastro-esophageal reflux disease without esophagitis: Secondary | ICD-10-CM

## 2019-04-24 ENCOUNTER — Other Ambulatory Visit: Payer: Self-pay | Admitting: Family Medicine

## 2019-04-24 DIAGNOSIS — F329 Major depressive disorder, single episode, unspecified: Secondary | ICD-10-CM

## 2019-04-24 DIAGNOSIS — F32A Depression, unspecified: Secondary | ICD-10-CM

## 2019-04-28 ENCOUNTER — Other Ambulatory Visit: Payer: Self-pay | Admitting: Family Medicine

## 2019-04-28 NOTE — Telephone Encounter (Signed)
Last office visit 03/28/2019

## 2019-05-14 ENCOUNTER — Encounter: Payer: Self-pay | Admitting: Family Medicine

## 2019-05-17 NOTE — Telephone Encounter (Signed)
Please ask the pt. To set up an appt. To discuss these concerns. WS

## 2019-05-18 ENCOUNTER — Other Ambulatory Visit: Payer: Self-pay | Admitting: Family Medicine

## 2019-05-18 DIAGNOSIS — F329 Major depressive disorder, single episode, unspecified: Secondary | ICD-10-CM

## 2019-05-18 DIAGNOSIS — F32A Depression, unspecified: Secondary | ICD-10-CM

## 2019-05-18 NOTE — Telephone Encounter (Signed)
Last office visit 03/28/2019 Medication is not listed on patient's med list

## 2019-05-20 ENCOUNTER — Telehealth (INDEPENDENT_AMBULATORY_CARE_PROVIDER_SITE_OTHER): Payer: BC Managed Care – PPO | Admitting: Family Medicine

## 2019-05-20 ENCOUNTER — Encounter: Payer: Self-pay | Admitting: Family Medicine

## 2019-05-20 ENCOUNTER — Other Ambulatory Visit: Payer: Self-pay | Admitting: Family Medicine

## 2019-05-20 DIAGNOSIS — G43019 Migraine without aura, intractable, without status migrainosus: Secondary | ICD-10-CM

## 2019-05-20 DIAGNOSIS — I10 Essential (primary) hypertension: Secondary | ICD-10-CM | POA: Diagnosis not present

## 2019-05-20 DIAGNOSIS — B351 Tinea unguium: Secondary | ICD-10-CM | POA: Diagnosis not present

## 2019-05-20 DIAGNOSIS — F329 Major depressive disorder, single episode, unspecified: Secondary | ICD-10-CM

## 2019-05-20 DIAGNOSIS — F32A Depression, unspecified: Secondary | ICD-10-CM

## 2019-05-20 MED ORDER — CYCLOBENZAPRINE HCL 10 MG PO TABS: 10.0000 mg | ORAL_TABLET | Freq: Three times a day (TID) | ORAL | 1 refills | Status: DC | PRN

## 2019-05-20 MED ORDER — TERBINAFINE HCL 250 MG PO TABS: 250.0000 mg | ORAL_TABLET | Freq: Every day | ORAL | 2 refills | Status: DC

## 2019-05-20 MED ORDER — CHLORTHALIDONE 25 MG PO TABS: ORAL_TABLET | ORAL | 1 refills | Status: DC

## 2019-05-20 MED ORDER — DILTIAZEM HCL ER COATED BEADS 300 MG PO CP24: 300.0000 mg | ORAL_CAPSULE | Freq: Every day | ORAL | 0 refills | Status: DC

## 2019-05-20 NOTE — Progress Notes (Signed)
Subjective:    Patient ID: Lisa Parrish, female    DOB: 05-24-1960, 59 y.o.   MRN: [id number]   HPI: Lisa Parrish is a 59 y.o. female presenting for headache control. Just started back after about 2 years. Two in the last couple of months.  They she also are rather severe.  They have responded well in the past cyclobenzaprine.  She is out of it and would like refills.  She could not tolerate hydralazine and is now back on Cardizem.  This was for blood pressure  She also has unsightly appearing toenails she tells me.  They do cause some discomfort with wearing shoes that put pressure on them at times because of the misshapen structure of the nails.   Depression screen [facility] 2/9 01/19/2019 10/12/2018 07/27/2018 05/18/2018 03/04/2018  Decreased Interest 0 0 0 0 0  Down, Depressed, Hopeless 0 0 0 0 0  PHQ - 2 Score 0 0 0 0 0     Relevant past medical, surgical, family and social history reviewed and updated as indicated.  Interim medical history since our last visit reviewed. Allergies and medications reviewed and updated.  ROS:  Review of Systems  Constitutional: Negative.   HENT: Negative.   Eyes: Negative for visual disturbance.  Respiratory: Negative for shortness of breath.   Cardiovascular: Negative for chest pain.  Gastrointestinal: Negative for abdominal pain.  Musculoskeletal: Negative for arthralgias.  Neurological: Positive for headaches.     Social History   Tobacco Use  Smoking Status Former Smoker  . Packs/day: 1.00  . Years: 10.00  . Pack years: 10.00  . Types: Cigarettes  . Quit date: 09/18/1991  . Years since quitting: 27.6  Smokeless Tobacco Never Used       Objective:     Wt Readings from Last 3 Encounters:  03/11/19 175 lb (79.4 kg)  01/19/19 174 lb (78.9 kg)  10/12/18 171 lb (77.6 kg)     Exam deferred. Pt. Harboring due to COVID 19. Phone visit performed.   Assessment & Plan:   1. Onychomycosis due to dermatophyte   2. Essential  hypertension   3. Intractable migraine without aura and without status migrainosus     Meds ordered this encounter  Medications  . diltiazem (CARDIZEM CD) 300 MG 24 hr capsule    Sig: Take 1 capsule (300 mg total) by mouth daily. For blood pressure    Dispense:  90 capsule    Refill:  0  . chlorthalidone (HYGROTON) 25 MG tablet    Sig: TAKE 1/2 TABLET (12.5 MG TOTAL) BY MOUTH 2 (TWO) TIMES DAILY.    Dispense:  90 tablet    Refill:  1  . terbinafine (LAMISIL) 250 MG tablet    Sig: Take 1 tablet (250 mg total) by mouth daily.    Dispense:  30 tablet    Refill:  2  . cyclobenzaprine (FLEXERIL) 10 MG tablet    Sig: Take 1 tablet (10 mg total) by mouth 3 (three) times daily as needed for muscle spasms.    Dispense:  90 tablet    Refill:  1    No orders of the defined types were placed in this encounter.     Diagnoses and all orders for this visit:  Onychomycosis due to dermatophyte -     terbinafine (LAMISIL) 250 MG tablet; Take 1 tablet (250 mg total) by mouth daily.  Essential hypertension -     diltiazem (CARDIZEM CD) 300 MG 24 hr capsule; Take  1 capsule (300 mg total) by mouth daily. For blood pressure -     chlorthalidone (HYGROTON) 25 MG tablet; TAKE 1/2 TABLET (12.5 MG TOTAL) BY MOUTH 2 (TWO) TIMES DAILY.  Intractable migraine without aura and without status migrainosus -     cyclobenzaprine (FLEXERIL) 10 MG tablet; Take 1 tablet (10 mg total) by mouth 3 (three) times daily as needed for muscle spasms.    Virtual Visit via telephone Note  I discussed the limitations, risks, security and privacy concerns of performing an evaluation and management service by telephone and the availability of in person appointments. The patient was identified with two identifiers. Pt.expressed understanding and agreed to proceed. Pt. Is at home. Dr. Livia Snellen is in his office.  Follow Up Instructions:   I discussed the assessment and treatment plan with the patient. The patient was  provided an opportunity to ask questions and all were answered. The patient agreed with the plan and demonstrated an understanding of the instructions.   The patient was advised to call back or seek an in-person evaluation if the symptoms worsen or if the condition fails to improve as anticipated.   Total minutes including chart review and phone contact time: 19   Follow up plan: Return in about 2 months (around 07/20/2019) for Pain, hypertension, blood work for use of terbinafine.  Lisa Fraise, MD [place]

## 2019-05-20 NOTE — Telephone Encounter (Signed)
Patient has appt with you today.

## 2019-05-22 ENCOUNTER — Other Ambulatory Visit: Payer: Self-pay | Admitting: Family Medicine

## 2019-05-22 DIAGNOSIS — H6690 Otitis media, unspecified, unspecified ear: Secondary | ICD-10-CM

## 2019-06-09 ENCOUNTER — Telehealth: Payer: Self-pay | Admitting: Cardiovascular Disease

## 2019-06-12 ENCOUNTER — Other Ambulatory Visit: Payer: Self-pay | Admitting: Family Medicine

## 2019-06-12 DIAGNOSIS — F32A Depression, unspecified: Secondary | ICD-10-CM

## 2019-06-12 DIAGNOSIS — F329 Major depressive disorder, single episode, unspecified: Secondary | ICD-10-CM

## 2019-06-19 ENCOUNTER — Other Ambulatory Visit: Payer: Self-pay | Admitting: Family Medicine

## 2019-06-19 DIAGNOSIS — K219 Gastro-esophageal reflux disease without esophagitis: Secondary | ICD-10-CM

## 2019-07-07 ENCOUNTER — Other Ambulatory Visit: Payer: Self-pay | Admitting: Family Medicine

## 2019-07-07 DIAGNOSIS — F329 Major depressive disorder, single episode, unspecified: Secondary | ICD-10-CM

## 2019-07-07 DIAGNOSIS — F32A Depression, unspecified: Secondary | ICD-10-CM

## 2019-07-08 ENCOUNTER — Other Ambulatory Visit: Payer: Self-pay | Admitting: Family Medicine

## 2019-07-08 DIAGNOSIS — G43019 Migraine without aura, intractable, without status migrainosus: Secondary | ICD-10-CM

## 2019-07-08 NOTE — Telephone Encounter (Signed)
Needs to be seen

## 2019-07-09 ENCOUNTER — Other Ambulatory Visit: Payer: Self-pay | Admitting: Family Medicine

## 2019-07-09 DIAGNOSIS — I10 Essential (primary) hypertension: Secondary | ICD-10-CM

## 2019-07-09 DIAGNOSIS — F32A Depression, unspecified: Secondary | ICD-10-CM

## 2019-07-09 DIAGNOSIS — F329 Major depressive disorder, single episode, unspecified: Secondary | ICD-10-CM

## 2019-07-12 ENCOUNTER — Encounter: Payer: Self-pay | Admitting: Family Medicine

## 2019-07-12 ENCOUNTER — Ambulatory Visit (INDEPENDENT_AMBULATORY_CARE_PROVIDER_SITE_OTHER): Payer: BC Managed Care – PPO | Admitting: Family Medicine

## 2019-07-12 ENCOUNTER — Other Ambulatory Visit: Payer: Self-pay

## 2019-07-12 VITALS — BP 118/78 | HR 82 | Temp 97.8°F | Resp 20 | Ht 66.0 in | Wt 185.1 lb

## 2019-07-12 DIAGNOSIS — Z79899 Other long term (current) drug therapy: Secondary | ICD-10-CM

## 2019-07-12 DIAGNOSIS — M5416 Radiculopathy, lumbar region: Secondary | ICD-10-CM | POA: Diagnosis not present

## 2019-07-12 DIAGNOSIS — D485 Neoplasm of uncertain behavior of skin: Secondary | ICD-10-CM | POA: Diagnosis not present

## 2019-07-12 DIAGNOSIS — Z029 Encounter for administrative examinations, unspecified: Secondary | ICD-10-CM

## 2019-07-12 MED ORDER — OXYCODONE-ACETAMINOPHEN 5-325 MG PO TABS: ORAL_TABLET | ORAL | 0 refills | Status: DC

## 2019-07-12 NOTE — Progress Notes (Signed)
Subjective:  Patient ID: Lisa Parrish, female    DOB: 03-01-1961  Age: 59 y.o. MRN: [id number]  CC: No chief complaint on file.   HPI Lisa Parrish presents for 12-month recheck of her lower back pain.  She says it is bilateral it is just above her buttocks it does not radiate into the legs.  She relates that it is a struggle every day just to get going.  She has a physical job.  She has been squatting a lot at work.  She describes a constant ache that just drains her energy out of her.  The pain between doses of medicine when the first when it wears off, the pain is 8/10.  When she takes an oxycodone as soon as it takes effect with an 30 minutes or so it will drop to 4/10 and she will feel more functional.  It does wear off between doses.  Lyrica works well for her.  She says that without it she would be bedridden.  In fact she can tell huge difference just when she misses a single dose.  Patient says that she is due for an FMLA to be updated.  She tends to miss 2 to 3 days per episode of flareup of pain.  These happen a couple of times a month sometimes more sometimes a little less.  Patient also has a spot on her right lower leg just above the ankle.  It is appeared in the last month.  It started off looking like a bit of an age spot.  But has developed a dark border around it that makes the patient nervous because she has a family history of members dying of skin cancers.  She does not remember the type of cancer.  It just seems to be developing rather quickly over the last several weeks.  Depression screen [facility] 2/9 07/12/2019 01/19/2019 10/12/2018  Decreased Interest 0 0 0  Down, Depressed, Hopeless 0 0 0  PHQ - 2 Score 0 0 0    History Lisa Parrish has a past medical history of Allergy, Anxiety, Constipation, Depression, GERD (gastroesophageal reflux disease), Headache, migraine, Hyperlipidemia, Hypertension, Kidney stones, Osteoarthritis, Pulmonary HTN ([place]) (12/21/2014), S/P cardiac cath  12/20/14 normal coronary arteries (12/21/2014), and Seasonal allergies.   She has a past surgical history that includes Hemorrhoid surgery (1989); Tubal ligation (1990); lumbar disckectomy (2006); Cardiac catheterization (N/A, 12/20/2014); Colonoscopy; Polypectomy; and Lumbar fusion (2018).   Her family history includes Cancer in her mother; Colon cancer in her paternal uncle; Colon cancer (age of onset: 3) in her paternal aunt and paternal uncle; Esophageal cancer in her brother; Heart disease in her father.She reports that she quit smoking about 27 years ago. Her smoking use included cigarettes. She has a 10.00 pack-year smoking history. She has never used smokeless tobacco. She reports that she does not drink alcohol or use drugs.    ROS Review of Systems  Constitutional: Negative.   HENT: Negative.   Eyes: Negative for visual disturbance.  Respiratory: Negative for shortness of breath.   Cardiovascular: Negative for chest pain.  Gastrointestinal: Negative for abdominal pain.  Musculoskeletal: Positive for back pain. Negative for arthralgias.  Skin:       Lesion as noted above    Objective:  BP 118/78   Pulse 82   Temp 97.8 F (36.6 C) (Temporal)   Resp 20   Ht 5\' 6"  (1.676 m)   Wt 185 lb 2 oz (84 kg)   SpO2 99%   BMI 29.88  kg/m   BP Readings from Last 3 Encounters:  07/12/19 118/78  03/11/19 137/83  01/19/19 (!) 156/96    Wt Readings from Last 3 Encounters:  07/12/19 185 lb 2 oz (84 kg)  03/11/19 175 lb (79.4 kg)  01/19/19 174 lb (78.9 kg)     Physical Exam Constitutional:      General: She is not in acute distress.    Appearance: She is well-developed.  Cardiovascular:     Rate and Rhythm: Normal rate and regular rhythm.  Pulmonary:     Breath sounds: Normal breath sounds.  Skin:    General: Skin is warm and dry.  Neurological:     Mental Status: She is alert and oriented to person, place, and time.      The lesion measures approximately 3 mm at its  longest diameter.  He does have a dark black border to it.  Patient does have a history of sun exposure in the area.  Location is at the lateral aspect of the right lower leg approximately 8 cm above the lateral malleolus.   Assessment & Plan:   Diagnoses and all orders for this visit:  Lumbar radiculopathy -     oxyCODONE-acetaminophen (PERCOCET) 5-325 MG tablet; One each morning, two each afternoon and two at bedtime -     oxyCODONE-acetaminophen (PERCOCET) 5-325 MG tablet; One each morning, two each afternoon and two at bedtime -     oxyCODONE-acetaminophen (PERCOCET) 5-325 MG tablet; One each morning, two each afternoon and two at bedtime  Controlled substance agreement signed -     ToxASSURE Select 13 (MW), Urine  Neoplasm of uncertain behavior of skin -     Ambulatory referral to Dermatology       I have discontinued Shata Harter-[name]'s fluticasone. I am also having her maintain her Multiple Vitamins-Minerals (HAIR/SKIN/NAILS PO), vitamin C, Magnesium, b complex vitamins, Polyethylene Glycol 3350 (MIRALAX PO), clobetasol cream, pregabalin, azelastine, tiZANidine, chlorthalidone, terbinafine, cyclobenzaprine, pantoprazole, metoprolol succinate, potassium chloride, escitalopram, diltiazem, buPROPion, oxyCODONE-acetaminophen, oxyCODONE-acetaminophen, and oxyCODONE-acetaminophen.  Allergies as of 07/12/2019      Reactions   Gabapentin Anxiety   Intense, alarming dreams   Amlodipine Swelling   Hydralazine    Hydrocodone    Norco   Morphine And Related Nausea And Vomiting   Promethazine    Burns through the IV   Tramadol Other (See Comments)   Upset stomach      Medication List       Accurate as of Jul 12, 2019 10:35 AM. If you have any questions, ask your nurse or doctor.        STOP taking these medications   fluticasone 50 MCG/ACT nasal spray Commonly known as: FLONASE Stopped by: Claretta Fraise, MD     TAKE these medications   azelastine 0.05 % ophthalmic  solution Commonly known as: OPTIVAR PLACE 1 DROP INTO EACH EYE ONCE DAILY AS NEEDED FOR ALLERGIES   b complex vitamins capsule Take 1 capsule by mouth daily.   buPROPion 300 MG 24 hr tablet Commonly known as: WELLBUTRIN XL Take 1 tablet (300 mg total) by mouth daily.   chlorthalidone 25 MG tablet Commonly known as: HYGROTON TAKE 1/2 TABLET (12.5 MG TOTAL) BY MOUTH 2 (TWO) TIMES DAILY.   clobetasol cream 0.05 % Commonly known as: TEMOVATE Apply 1 application topically 2 (two) times daily.   cyclobenzaprine 10 MG tablet Commonly known as: FLEXERIL Take 1 tablet (10 mg total) by mouth 3 (three) times daily as needed for muscle spasms.  diltiazem 300 MG 24 hr capsule Commonly known as: CARDIZEM CD TAKE 1 CAPSULE (300 MG TOTAL) BY MOUTH DAILY. FOR BLOOD PRESSURE   escitalopram 10 MG tablet Commonly known as: LEXAPRO TAKE 1 TABLET BY MOUTH EVERY DAY   HAIR/SKIN/NAILS PO Take 1 tablet by mouth daily. Reported on 03/19/2015   Magnesium 250 MG Tabs Take by mouth.   metoprolol succinate 50 MG 24 hr tablet Commonly known as: TOPROL-XL TAKE 1 TABLET (50 MG TOTAL) BY MOUTH DAILY. FOR BLOOD PRESSURE CONTROL   MIRALAX PO 2-3 times a week   oxyCODONE-acetaminophen 5-325 MG tablet Commonly known as: Percocet One each morning, two each afternoon and two at bedtime Start taking on: Jul 26, 2019 What changed: These instructions start on Jul 26, 2019. If you are unsure what to do until then, ask your doctor or other care provider. Changed by: Claretta Fraise, MD   oxyCODONE-acetaminophen 5-325 MG tablet Commonly known as: Percocet One each morning, two each afternoon and two at bedtime Start taking on: August 25, 2019 What changed: These instructions start on August 25, 2019. If you are unsure what to do until then, ask your doctor or other care provider. Changed by: Claretta Fraise, MD   oxyCODONE-acetaminophen 5-325 MG tablet Commonly known as: Percocet One each morning, two each  afternoon and two at bedtime Start taking on: September 24, 2019 What changed: These instructions start on September 24, 2019. If you are unsure what to do until then, ask your doctor or other care provider. Changed by: Claretta Fraise, MD   pantoprazole 40 MG tablet Commonly known as: PROTONIX TAKE 1 TABLET (40 MG TOTAL) BY MOUTH 2 (TWO) TIMES DAILY BEFORE A MEAL.   potassium chloride 10 MEQ tablet Commonly known as: KLOR-CON TAKE 1 TABLET BY MOUTH EVERY DAY   pregabalin 300 MG capsule Commonly known as: LYRICA Take 1 capsule (300 mg total) by mouth at bedtime.   terbinafine 250 MG tablet Commonly known as: LamISIL Take 1 tablet (250 mg total) by mouth daily.   tiZANidine 4 MG tablet Commonly known as: ZANAFLEX TAKE 1 TABLET (4 MG TOTAL) BY MOUTH 3 (THREE) TIMES DAILY.   vitamin C 1000 MG tablet Take 1,000 mg by mouth daily.        Follow-up: Return in about 3 months (around 10/12/2019), or if symptoms worsen or fail to improve.  Claretta Fraise, M.D.

## 2019-07-15 LAB — TOXASSURE SELECT 13 (MW), URINE

## 2019-07-20 ENCOUNTER — Other Ambulatory Visit: Payer: Self-pay | Admitting: Family Medicine

## 2019-07-20 MED ORDER — TIZANIDINE HCL 4 MG PO TABS: 4.0000 mg | ORAL_TABLET | Freq: Three times a day (TID) | ORAL | 0 refills | Status: DC

## 2019-07-20 NOTE — Telephone Encounter (Signed)
Confirm with the patient that she is using the tizanidine.  She also has a prescription for cyclobenzaprine.  She can use one of the other but not both since they are both muscle relaxers.

## 2019-07-20 NOTE — Telephone Encounter (Signed)
Uses Tizanidine for back, Cyclobenzaprine prn for migraines and does not take together.

## 2019-07-20 NOTE — Telephone Encounter (Signed)
Done. Thanks, WS 

## 2019-07-20 NOTE — Telephone Encounter (Signed)
Called patient, no answer, left message to return call 

## 2019-07-21 ENCOUNTER — Encounter: Payer: Self-pay | Admitting: Family Medicine

## 2019-07-29 ENCOUNTER — Encounter: Payer: Self-pay | Admitting: Family Medicine

## 2019-07-31 ENCOUNTER — Other Ambulatory Visit: Payer: Self-pay | Admitting: Family Medicine

## 2019-07-31 DIAGNOSIS — B351 Tinea unguium: Secondary | ICD-10-CM

## 2019-07-31 DIAGNOSIS — I10 Essential (primary) hypertension: Secondary | ICD-10-CM

## 2019-08-02 ENCOUNTER — Ambulatory Visit: Payer: BC Managed Care – PPO | Admitting: Family Medicine

## 2019-08-03 ENCOUNTER — Other Ambulatory Visit: Payer: Self-pay | Admitting: Family Medicine

## 2019-08-03 DIAGNOSIS — F32A Depression, unspecified: Secondary | ICD-10-CM

## 2019-08-18 ENCOUNTER — Other Ambulatory Visit: Payer: Self-pay

## 2019-08-18 MED ORDER — CLOBETASOL PROPIONATE 0.05 % EX CREA: 1.0000 "application " | TOPICAL_CREAM | Freq: Two times a day (BID) | CUTANEOUS | 2 refills | Status: DC

## 2019-08-19 ENCOUNTER — Other Ambulatory Visit: Payer: Self-pay | Admitting: Family Medicine

## 2019-08-19 DIAGNOSIS — I10 Essential (primary) hypertension: Secondary | ICD-10-CM

## 2019-09-05 ENCOUNTER — Other Ambulatory Visit: Payer: Self-pay | Admitting: Family Medicine

## 2019-09-05 DIAGNOSIS — K219 Gastro-esophageal reflux disease without esophagitis: Secondary | ICD-10-CM

## 2019-09-30 ENCOUNTER — Other Ambulatory Visit: Payer: Self-pay | Admitting: Family Medicine

## 2019-10-02 ENCOUNTER — Other Ambulatory Visit: Payer: Self-pay | Admitting: Family Medicine

## 2019-10-12 ENCOUNTER — Encounter: Payer: Self-pay | Admitting: Family Medicine

## 2019-10-12 ENCOUNTER — Ambulatory Visit: Payer: BC Managed Care – PPO | Admitting: Family Medicine

## 2019-10-12 ENCOUNTER — Other Ambulatory Visit: Payer: Self-pay

## 2019-10-12 VITALS — BP 138/89 | HR 75 | Temp 97.9°F | Ht 66.0 in | Wt 185.8 lb

## 2019-10-12 DIAGNOSIS — G4730 Sleep apnea, unspecified: Secondary | ICD-10-CM | POA: Diagnosis not present

## 2019-10-12 DIAGNOSIS — K219 Gastro-esophageal reflux disease without esophagitis: Secondary | ICD-10-CM

## 2019-10-12 DIAGNOSIS — E785 Hyperlipidemia, unspecified: Secondary | ICD-10-CM

## 2019-10-12 DIAGNOSIS — G8929 Other chronic pain: Secondary | ICD-10-CM | POA: Diagnosis not present

## 2019-10-12 DIAGNOSIS — G43019 Migraine without aura, intractable, without status migrainosus: Secondary | ICD-10-CM

## 2019-10-12 DIAGNOSIS — I10 Essential (primary) hypertension: Secondary | ICD-10-CM

## 2019-10-12 DIAGNOSIS — I272 Pulmonary hypertension, unspecified: Secondary | ICD-10-CM

## 2019-10-12 DIAGNOSIS — M5416 Radiculopathy, lumbar region: Secondary | ICD-10-CM

## 2019-10-12 DIAGNOSIS — F1120 Opioid dependence, uncomplicated: Secondary | ICD-10-CM

## 2019-10-12 MED ORDER — OXYCODONE-ACETAMINOPHEN 5-325 MG PO TABS: ORAL_TABLET | ORAL | 0 refills | Status: DC

## 2019-10-12 MED ORDER — DILTIAZEM HCL ER COATED BEADS 300 MG PO CP24: 300.0000 mg | ORAL_CAPSULE | Freq: Every day | ORAL | 1 refills | Status: DC

## 2019-10-12 MED ORDER — ESCITALOPRAM OXALATE 10 MG PO TABS: 10.0000 mg | ORAL_TABLET | Freq: Every day | ORAL | 1 refills | Status: DC

## 2019-10-12 MED ORDER — CHLORTHALIDONE 25 MG PO TABS: ORAL_TABLET | ORAL | 1 refills | Status: DC

## 2019-10-12 MED ORDER — PREGABALIN 200 MG PO CAPS: 400.0000 mg | ORAL_CAPSULE | Freq: Every day | ORAL | 1 refills | Status: DC

## 2019-10-12 MED ORDER — CLOBETASOL PROPIONATE 0.05 % EX CREA: 1.0000 "application " | TOPICAL_CREAM | Freq: Two times a day (BID) | CUTANEOUS | 2 refills | Status: DC

## 2019-10-12 MED ORDER — CYCLOBENZAPRINE HCL 10 MG PO TABS: 10.0000 mg | ORAL_TABLET | Freq: Three times a day (TID) | ORAL | 1 refills | Status: DC | PRN

## 2019-10-12 MED ORDER — POTASSIUM CHLORIDE ER 10 MEQ PO TBCR: 10.0000 meq | EXTENDED_RELEASE_TABLET | Freq: Every day | ORAL | 1 refills | Status: DC

## 2019-10-12 MED ORDER — METOPROLOL SUCCINATE ER 50 MG PO TB24: 50.0000 mg | ORAL_TABLET | Freq: Every day | ORAL | 0 refills | Status: DC

## 2019-10-12 NOTE — Progress Notes (Signed)
Subjective:  Patient ID: Lisa Parrish, female    DOB: 08-17-1960  Age: 59 y.o. MRN: [id number]  CC: Follow-up (3 month)   HPI Lisa Parrish presents for follow up of chronic pain. It affects the left hip and SI joints as well as the left leg. She has been taking shots for it as well. Pain is 7/10.. Daily MME is 37.5 and LME is 2.01.   Depression screen [facility] 2/9 10/12/2019 07/12/2019 01/19/2019  Decreased Interest 0 0 0  Down, Depressed, Hopeless 0 0 0  PHQ - 2 Score 0 0 0    History Lisa Parrish has a past medical history of Allergy, Anxiety, Anxiety state (08/24/2012), Basal cell carcinoma, Constipation, Depression, GERD (gastroesophageal reflux disease), Headache, migraine, Hyperlipidemia, Hypertension, Kidney stones, Osteoarthritis, Pulmonary HTN ([place]) (12/21/2014), S/P cardiac cath 12/20/14 normal coronary arteries (12/21/2014), and Seasonal allergies.   She has a past surgical history that includes Hemorrhoid surgery (1989); Tubal ligation (1990); lumbar disckectomy (2006); Cardiac catheterization (N/A, 12/20/2014); Colonoscopy; Polypectomy; and Lumbar fusion (2018).   Her family history includes Cancer in her mother; Colon cancer in her paternal uncle; Colon cancer (age of onset: 75) in her paternal aunt and paternal uncle; Esophageal cancer in her brother; Heart disease in her father.She reports that she quit smoking about 28 years ago. Her smoking use included cigarettes. She has a 10.00 pack-year smoking history. She has never used smokeless tobacco. She reports that she does not drink alcohol and does not use drugs.    ROS Review of Systems  Constitutional: Negative.   HENT: Negative.   Eyes: Negative for visual disturbance.  Respiratory: Negative for shortness of breath.   Cardiovascular: Negative for chest pain.  Gastrointestinal: Negative for abdominal pain.  Musculoskeletal: Negative for arthralgias.    Objective:  BP 138/89   Pulse 75   Temp 97.9 F (36.6 C)  (Temporal)   Ht 5\' 6"  (1.676 m)   Wt 185 lb 12.8 oz (84.3 kg)   BMI 29.99 kg/m   BP Readings from Last 3 Encounters:  10/12/19 138/89  07/12/19 118/78  03/11/19 137/83    Wt Readings from Last 3 Encounters:  10/12/19 185 lb 12.8 oz (84.3 kg)  07/12/19 185 lb 2 oz (84 kg)  03/11/19 175 lb (79.4 kg)     Physical Exam Constitutional:      General: She is not in acute distress.    Appearance: She is well-developed.  Cardiovascular:     Rate and Rhythm: Normal rate and regular rhythm.  Pulmonary:     Breath sounds: Normal breath sounds.  Skin:    General: Skin is warm and dry.  Neurological:     Mental Status: She is alert and oriented to person, place, and time.       Assessment & Plan:   Lisa Parrish was seen today for follow-up.  Diagnoses and all orders for this visit:  Essential hypertension -     chlorthalidone (HYGROTON) 25 MG tablet; TAKE 1/2 TABLET (12.5 MG TOTAL) BY MOUTH 2 (TWO) TIMES DAILY. -     diltiazem (CARDIZEM CD) 300 MG 24 hr capsule; Take 1 capsule (300 mg total) by mouth daily. For blood pressure -     metoprolol succinate (TOPROL-XL) 50 MG 24 hr tablet; Take 1 tablet (50 mg total) by mouth daily. For blood pressure control  Other chronic pain -     cyclobenzaprine (FLEXERIL) 10 MG tablet; Take 1 tablet (10 mg total) by mouth 3 (three) times daily as needed for  muscle spasms.  Hyperlipidemia, unspecified hyperlipidemia type  Sleep apnea in adult  Gastroesophageal reflux disease without esophagitis  Opiate dependence, continuous (HCC)  Pulmonary HTN (HCC)  Lumbar radiculopathy -     oxyCODONE-acetaminophen (PERCOCET) 5-325 MG tablet; One each morning, two each afternoon and two at bedtime -     oxyCODONE-acetaminophen (PERCOCET) 5-325 MG tablet; One each morning, two each afternoon and two at bedtime -     oxyCODONE-acetaminophen (PERCOCET) 5-325 MG tablet; One each morning, two each afternoon and two at bedtime -     cyclobenzaprine (FLEXERIL)  10 MG tablet; Take 1 tablet (10 mg total) by mouth 3 (three) times daily as needed for muscle spasms. -     Ambulatory referral to Neurosurgery  Intractable migraine without aura and without status migrainosus -     cyclobenzaprine (FLEXERIL) 10 MG tablet; Take 1 tablet (10 mg total) by mouth 3 (three) times daily as needed for muscle spasms.  Other orders -     pregabalin (LYRICA) 200 MG capsule; Take 2 capsules (400 mg total) by mouth at bedtime. -     clobetasol cream (TEMOVATE) 0.05 %; Apply 1 application topically 2 (two) times daily. -     escitalopram (LEXAPRO) 10 MG tablet; Take 1 tablet (10 mg total) by mouth daily. -     potassium chloride (KLOR-CON) 10 MEQ tablet; Take 1 tablet (10 mEq total) by mouth daily.       I have discontinued Lisa Parrish's fluticasone. I have also changed her pregabalin, escitalopram, and potassium chloride. Additionally, I am having her maintain her Multiple Vitamins-Minerals (HAIR/SKIN/NAILS PO), vitamin C, Magnesium, b complex vitamins, Polyethylene Glycol 3350 (MIRALAX PO), azelastine, terbinafine, buPROPion, pantoprazole, tiZANidine, oxyCODONE-acetaminophen, oxyCODONE-acetaminophen, oxyCODONE-acetaminophen, chlorthalidone, clobetasol cream, cyclobenzaprine, diltiazem, and metoprolol succinate.  Allergies as of 10/12/2019      Reactions   Gabapentin Anxiety   Intense, alarming dreams   Amlodipine Swelling   Hydralazine    Hydrocodone    Norco   Morphine And Related Nausea And Vomiting   Promethazine    Burns through the IV   Tramadol Other (See Comments)   Upset stomach      Medication List       Accurate as of October 12, 2019 11:59 PM. If you have any questions, ask your nurse or doctor.        STOP taking these medications   fluticasone 50 MCG/ACT nasal spray Commonly known as: FLONASE Stopped by: Claretta Fraise, MD     TAKE these medications   azelastine 0.05 % ophthalmic solution Commonly known as: OPTIVAR PLACE 1  DROP INTO EACH EYE ONCE DAILY AS NEEDED FOR ALLERGIES   b complex vitamins capsule Take 1 capsule by mouth daily.   buPROPion 300 MG 24 hr tablet Commonly known as: WELLBUTRIN XL TAKE 1 TABLET BY MOUTH EVERY DAY   chlorthalidone 25 MG tablet Commonly known as: HYGROTON TAKE 1/2 TABLET (12.5 MG TOTAL) BY MOUTH 2 (TWO) TIMES DAILY.   clobetasol cream 0.05 % Commonly known as: TEMOVATE Apply 1 application topically 2 (two) times daily.   cyclobenzaprine 10 MG tablet Commonly known as: FLEXERIL Take 1 tablet (10 mg total) by mouth 3 (three) times daily as needed for muscle spasms.   diltiazem 300 MG 24 hr capsule Commonly known as: CARDIZEM CD Take 1 capsule (300 mg total) by mouth daily. For blood pressure   escitalopram 10 MG tablet Commonly known as: LEXAPRO Take 1 tablet (10 mg total) by mouth daily.  HAIR/SKIN/NAILS PO Take 1 tablet by mouth daily. Reported on 03/19/2015   Magnesium 250 MG Tabs Take by mouth.   metoprolol succinate 50 MG 24 hr tablet Commonly known as: TOPROL-XL Take 1 tablet (50 mg total) by mouth daily. For blood pressure control   MIRALAX PO 2-3 times a week   oxyCODONE-acetaminophen 5-325 MG tablet Commonly known as: Percocet One each morning, two each afternoon and two at bedtime Start taking on: October 25, 2019 What changed: These instructions start on October 25, 2019. If you are unsure what to do until then, ask your doctor or other care provider. Changed by: Claretta Fraise, MD   oxyCODONE-acetaminophen 5-325 MG tablet Commonly known as: Percocet One each morning, two each afternoon and two at bedtime Start taking on: November 24, 2019 What changed: These instructions start on November 24, 2019. If you are unsure what to do until then, ask your doctor or other care provider. Changed by: Claretta Fraise, MD   oxyCODONE-acetaminophen 5-325 MG tablet Commonly known as: Percocet One each morning, two each afternoon and two at  bedtime Start taking on: December 24, 2019 What changed: These instructions start on December 24, 2019. If you are unsure what to do until then, ask your doctor or other care provider. Changed by: Claretta Fraise, MD   pantoprazole 40 MG tablet Commonly known as: PROTONIX TAKE 1 TABLET (40 MG TOTAL) BY MOUTH 2 (TWO) TIMES DAILY BEFORE A MEAL.   potassium chloride 10 MEQ tablet Commonly known as: KLOR-CON Take 1 tablet (10 mEq total) by mouth daily.   pregabalin 200 MG capsule Commonly known as: LYRICA Take 2 capsules (400 mg total) by mouth at bedtime. What changed:   medication strength  See the new instructions. Changed by: Claretta Fraise, MD   terbinafine 250 MG tablet Commonly known as: LAMISIL TAKE 1 TABLET BY MOUTH EVERY DAY   tiZANidine 4 MG tablet Commonly known as: ZANAFLEX TAKE 1 TABLET (4 MG TOTAL) BY MOUTH 3 (THREE) TIMES DAILY.   vitamin C 1000 MG tablet Take 1,000 mg by mouth daily.        Follow-up: Return in about 3 months (around 01/12/2020).  Claretta Fraise, M.D.

## 2019-10-22 ENCOUNTER — Encounter: Payer: Self-pay | Admitting: Family Medicine

## 2019-10-26 ENCOUNTER — Other Ambulatory Visit: Payer: Self-pay | Admitting: Family Medicine

## 2019-10-26 DIAGNOSIS — B351 Tinea unguium: Secondary | ICD-10-CM

## 2019-10-27 ENCOUNTER — Other Ambulatory Visit: Payer: Self-pay

## 2019-10-27 ENCOUNTER — Other Ambulatory Visit: Payer: Self-pay | Admitting: Family Medicine

## 2019-10-27 ENCOUNTER — Ambulatory Visit: Payer: Self-pay

## 2019-10-27 DIAGNOSIS — M546 Pain in thoracic spine: Secondary | ICD-10-CM

## 2019-10-27 DIAGNOSIS — M545 Low back pain, unspecified: Secondary | ICD-10-CM

## 2019-11-15 ENCOUNTER — Other Ambulatory Visit: Payer: Self-pay | Admitting: Family Medicine

## 2019-11-15 DIAGNOSIS — H6690 Otitis media, unspecified, unspecified ear: Secondary | ICD-10-CM

## 2019-11-27 ENCOUNTER — Other Ambulatory Visit: Payer: Self-pay | Admitting: Family Medicine

## 2019-11-27 DIAGNOSIS — K219 Gastro-esophageal reflux disease without esophagitis: Secondary | ICD-10-CM

## 2019-12-22 ENCOUNTER — Other Ambulatory Visit: Payer: Self-pay

## 2019-12-22 ENCOUNTER — Ambulatory Visit: Payer: BC Managed Care – PPO | Admitting: Family Medicine

## 2019-12-22 ENCOUNTER — Encounter: Payer: Self-pay | Admitting: Family Medicine

## 2019-12-22 VITALS — BP 150/85 | HR 70 | Temp 97.4°F | Ht 66.0 in | Wt 186.0 lb

## 2019-12-22 DIAGNOSIS — F1120 Opioid dependence, uncomplicated: Secondary | ICD-10-CM | POA: Diagnosis not present

## 2019-12-22 DIAGNOSIS — M5416 Radiculopathy, lumbar region: Secondary | ICD-10-CM

## 2019-12-25 ENCOUNTER — Other Ambulatory Visit: Payer: Self-pay | Admitting: Family Medicine

## 2019-12-25 ENCOUNTER — Encounter: Payer: Self-pay | Admitting: Family Medicine

## 2019-12-25 NOTE — Progress Notes (Signed)
Subjective:  Patient ID: Lisa Parrish, female    DOB: [birth year]  Age: 59 y.o. MRN: [id number]  CC: Paperwork   HPI Lisa Parrish presents for disability form related to her ongoing back pain. Lisa Parrish continues to be debilitated due to the severity of the pain. She cannot bend over or rotate the spine to turn as a result of the pain. MEdications do not reduce it significantly. Recent MRI shows no operable lesion.   Depression screen [facility] 2/9 12/22/2019 10/12/2019 07/12/2019  Decreased Interest 0 0 0  Down, Depressed, Hopeless 0 0 0  PHQ - 2 Score 0 0 0    History Lisa Parrish has a past medical history of Allergy, Anxiety, Anxiety state (08/24/2012), Basal cell carcinoma, Constipation, Depression, GERD (gastroesophageal reflux disease), Headache, migraine, Hyperlipidemia, Hypertension, Kidney stones, Osteoarthritis, Pulmonary HTN ([place]) (12/21/2014), S/P cardiac cath 12/20/14 normal coronary arteries (12/21/2014), and Seasonal allergies.   She has a past surgical history that includes Hemorrhoid surgery (1989); Tubal ligation (1990); lumbar disckectomy (2006); Cardiac catheterization (N/A, 12/20/2014); Colonoscopy; Polypectomy; and Lumbar fusion (2018).   Her family history includes Cancer in her mother; Colon cancer in her paternal uncle; Colon cancer (age of onset: 21) in her paternal aunt and paternal uncle; Esophageal cancer in her brother; Heart disease in her father.She reports that she quit smoking about 28 years ago. Her smoking use included cigarettes. She has a 10.00 pack-year smoking history. She has never used smokeless tobacco. She reports that she does not drink alcohol and does not use drugs.    ROS Review of Systems  Musculoskeletal: Positive for arthralgias, back pain, gait problem and myalgias.  Neurological: Positive for weakness and numbness.    Objective:  BP (!) 150/85    Pulse 70    Temp (!) 97.4 F (36.3 C) (Temporal)    Ht 5\' 6"  (1.676 m)    Wt 186 lb (84.4 kg)     BMI 30.02 kg/m   BP Readings from Last 3 Encounters:  12/22/19 (!) 150/85  10/12/19 138/89  07/12/19 118/78    Wt Readings from Last 3 Encounters:  12/22/19 186 lb (84.4 kg)  10/12/19 185 lb 12.8 oz (84.3 kg)  07/12/19 185 lb 2 oz (84 kg)     Physical Exam Constitutional:      General: She is not in acute distress.    Appearance: She is well-developed. She is ill-appearing.  Cardiovascular:     Rate and Rhythm: Normal rate and regular rhythm.  Pulmonary:     Breath sounds: Normal breath sounds.  Skin:    General: Skin is warm and dry.  Neurological:     Mental Status: She is alert and oriented to person, place, and time.       Assessment & Plan:   Lisa Parrish was seen today for paperwork.  Diagnoses and all orders for this visit:  Lumbar radiculopathy  Opiate dependence, continuous ([place])       I am having Lisa Parrish maintain her Multiple Vitamins-Minerals (HAIR/SKIN/NAILS PO), vitamin C, Magnesium, b complex vitamins, Polyethylene Glycol 3350 (MIRALAX PO), azelastine, terbinafine, buPROPion, tiZANidine, oxyCODONE-acetaminophen, oxyCODONE-acetaminophen, oxyCODONE-acetaminophen, pregabalin, chlorthalidone, clobetasol cream, cyclobenzaprine, diltiazem, escitalopram, metoprolol succinate, potassium chloride, and pantoprazole.  Allergies as of 12/22/2019      Reactions   Gabapentin Anxiety   Intense, alarming dreams   Amlodipine Swelling   Hydralazine    Hydrocodone    Norco   Morphine And Related Nausea And Vomiting   Promethazine    Burns through the IV  Tramadol Other (See Comments)   Upset stomach      Medication List       Accurate as of December 22, 2019 11:59 PM. If you have any questions, ask your nurse or doctor.        azelastine 0.05 % ophthalmic solution Commonly known as: OPTIVAR PLACE 1 DROP INTO EACH EYE ONCE DAILY AS NEEDED FOR ALLERGIES   b complex vitamins capsule Take 1 capsule by mouth daily.   buPROPion 300 MG 24 hr  tablet Commonly known as: WELLBUTRIN XL TAKE 1 TABLET BY MOUTH EVERY DAY   chlorthalidone 25 MG tablet Commonly known as: HYGROTON TAKE 1/2 TABLET (12.5 MG TOTAL) BY MOUTH 2 (TWO) TIMES DAILY.   clobetasol cream 0.05 % Commonly known as: TEMOVATE Apply 1 application topically 2 (two) times daily.   cyclobenzaprine 10 MG tablet Commonly known as: FLEXERIL Take 1 tablet (10 mg total) by mouth 3 (three) times daily as needed for muscle spasms.   diltiazem 300 MG 24 hr capsule Commonly known as: CARDIZEM CD Take 1 capsule (300 mg total) by mouth daily. For blood pressure   escitalopram 10 MG tablet Commonly known as: LEXAPRO Take 1 tablet (10 mg total) by mouth daily.   HAIR/SKIN/NAILS PO Take 1 tablet by mouth daily. Reported on 03/19/2015   Magnesium 250 MG Tabs Take by mouth.   metoprolol succinate 50 MG 24 hr tablet Commonly known as: TOPROL-XL Take 1 tablet (50 mg total) by mouth daily. For blood pressure control   MIRALAX PO 2-3 times a week   oxyCODONE-acetaminophen 5-325 MG tablet Commonly known as: Percocet One each morning, two each afternoon and two at bedtime   oxyCODONE-acetaminophen 5-325 MG tablet Commonly known as: Percocet One each morning, two each afternoon and two at bedtime   oxyCODONE-acetaminophen 5-325 MG tablet Commonly known as: Percocet One each morning, two each afternoon and two at bedtime   pantoprazole 40 MG tablet Commonly known as: PROTONIX TAKE 1 TABLET (40 MG TOTAL) BY MOUTH 2 (TWO) TIMES DAILY BEFORE A MEAL.   potassium chloride 10 MEQ tablet Commonly known as: KLOR-CON Take 1 tablet (10 mEq total) by mouth daily.   pregabalin 200 MG capsule Commonly known as: LYRICA Take 2 capsules (400 mg total) by mouth at bedtime.   terbinafine 250 MG tablet Commonly known as: LAMISIL TAKE 1 TABLET BY MOUTH EVERY DAY   tiZANidine 4 MG tablet Commonly known as: ZANAFLEX TAKE 1 TABLET (4 MG TOTAL) BY MOUTH 3 (THREE) TIMES DAILY.    vitamin C 1000 MG tablet Take 1,000 mg by mouth daily.      Disability form completed  Follow-up: Return if symptoms worsen or fail to improve, and as previously arranged.  Claretta Fraise, M.D.

## 2020-01-03 DIAGNOSIS — Z029 Encounter for administrative examinations, unspecified: Secondary | ICD-10-CM

## 2020-01-04 ENCOUNTER — Encounter: Payer: Self-pay | Admitting: Family Medicine

## 2020-01-11 ENCOUNTER — Other Ambulatory Visit: Payer: Self-pay | Admitting: Family Medicine

## 2020-01-11 DIAGNOSIS — F32A Depression, unspecified: Secondary | ICD-10-CM

## 2020-01-25 ENCOUNTER — Other Ambulatory Visit: Payer: Self-pay

## 2020-01-25 ENCOUNTER — Ambulatory Visit: Payer: BC Managed Care – PPO | Admitting: Family Medicine

## 2020-01-25 ENCOUNTER — Encounter: Payer: Self-pay | Admitting: Family Medicine

## 2020-01-25 VITALS — BP 148/99 | HR 84 | Temp 98.1°F | Resp 20 | Ht 66.0 in | Wt 183.1 lb

## 2020-01-25 DIAGNOSIS — M5416 Radiculopathy, lumbar region: Secondary | ICD-10-CM | POA: Diagnosis not present

## 2020-01-25 DIAGNOSIS — R339 Retention of urine, unspecified: Secondary | ICD-10-CM

## 2020-01-25 LAB — URINALYSIS, COMPLETE
Bilirubin, UA: NEGATIVE
Glucose, UA: NEGATIVE
Ketones, UA: NEGATIVE
Leukocytes,UA: NEGATIVE
Nitrite, UA: NEGATIVE
Protein,UA: NEGATIVE
RBC, UA: NEGATIVE
Specific Gravity, UA: 1.02 (ref 1.005–1.030)
Urobilinogen, Ur: 0.2 mg/dL (ref 0.2–1.0)
pH, UA: 7 (ref 5.0–7.5)

## 2020-01-25 LAB — MICROSCOPIC EXAMINATION

## 2020-01-25 MED ORDER — OXYCODONE-ACETAMINOPHEN 5-325 MG PO TABS: ORAL_TABLET | ORAL | 0 refills | Status: DC

## 2020-01-25 NOTE — Progress Notes (Signed)
Subjective:  Patient ID: Lisa Parrish, female    DOB: 05-Oct-1960  Age: 59 y.o. MRN: [id number]  CC: Medical Management of Chronic Issues  HPI Lisa Parrish presents for Concerned about her blood pressure dropping at times.  This does not make her syncopal or dizzy.  Patient continues to take oxycodone for her chronic pain syndrome.  She has a lumbar radiculopathy for which she takes oxycodone5/325 2 twice daily.  Pain is intractable.  Partial relief only with medication.  PDMP score show NARx of 491 for narcotic and for sedative.Her overdose risk score is 220.  Her morphine milligram equivalent is 37.5 mg daily she has a record of filling her oxycodone appropriately once monthly along with her pregabalin which she uses to mitigate the need for opiate medication.  Lisa Parrish also has concerned that for the last 6 weeks she has been having urinary retention.  It seems to last about a week to a week and a half at a time she feels pressure after she urinates and does not feel that she is completely emptying.  She is actually going less frequently than usual.  She is not incontinent of urine.   Depression screen [facility] 2/9 01/25/2020 01/25/2020 12/22/2019  Decreased Interest 0 0 0  Down, Depressed, Hopeless 3 3 0  PHQ - 2 Score 3 3 0  Altered sleeping 0 - -  Tired, decreased energy 0 - -  Change in appetite 0 - -  Feeling bad or failure about yourself  0 - -  Trouble concentrating 0 - -  Moving slowly or fidgety/restless 0 - -  Suicidal thoughts 0 - -  PHQ-9 Score 3 - -    History Lisa Parrish has a past medical history of Allergy, Anxiety, Anxiety state (08/24/2012), Basal cell carcinoma, Constipation, Depression, GERD (gastroesophageal reflux disease), Headache, migraine, Hyperlipidemia, Hypertension, Kidney stones, Osteoarthritis, Pulmonary HTN ([place]) (12/21/2014), S/P cardiac cath 12/20/14 normal coronary arteries (12/21/2014), and Seasonal allergies.   She has a past surgical  history that includes Hemorrhoid surgery (1989); Tubal ligation (1990); lumbar disckectomy (2006); Cardiac catheterization (N/A, 12/20/2014); Colonoscopy; Polypectomy; and Lumbar fusion (2018).   Her family history includes Cancer in her mother; Colon cancer in her paternal uncle; Colon cancer (age of onset: 72) in her paternal aunt and paternal uncle; Esophageal cancer in her brother; Heart disease in her father.She reports that she quit smoking about 28 years ago. Her smoking use included cigarettes. She has a 10.00 pack-year smoking history. She has never used smokeless tobacco. She reports that she does not drink alcohol and does not use drugs.    ROS Review of Systems  Constitutional: Negative.   HENT: Negative.   Eyes: Negative for visual disturbance.  Respiratory: Negative for shortness of breath.   Cardiovascular: Negative for chest pain.  Gastrointestinal: Negative for abdominal pain.  Genitourinary: Positive for decreased urine volume and difficulty urinating. Negative for dysuria, enuresis, flank pain, frequency and urgency.  Musculoskeletal: Positive for arthralgias and back pain.    Objective:  BP (!) 148/99   Pulse 84   Temp 98.1 F (36.7 C) (Temporal)   Resp 20   Ht 5\' 6"  (1.676 m)   Wt 183 lb 2 oz (83.1 kg)   SpO2 95%   BMI 29.56 kg/m   BP Readings from Last 3 Encounters:  01/25/20 (!) 148/99  12/22/19 (!) 150/85  10/12/19 138/89    Wt Readings from Last 3 Encounters:  01/25/20 183 lb 2 oz (83.1 kg)  12/22/19  186 lb (84.4 kg)  10/12/19 185 lb 12.8 oz (84.3 kg)     Physical Exam Constitutional:      General: She is not in acute distress.    Appearance: She is well-developed.  HENT:     Head: Normocephalic and atraumatic.  Eyes:     Conjunctiva/sclera: Conjunctivae normal.     Pupils: Pupils are equal, round, and reactive to light.  Neck:     Thyroid: No thyromegaly.  Cardiovascular:     Rate and Rhythm: Normal rate and regular rhythm.     Heart  sounds: Normal heart sounds. No murmur heard.   Pulmonary:     Effort: Pulmonary effort is normal. No respiratory distress.     Breath sounds: Normal breath sounds. No wheezing or rales.  Abdominal:     General: Bowel sounds are normal. There is no distension.     Palpations: Abdomen is soft.     Tenderness: There is no abdominal tenderness.  Musculoskeletal:        General: Normal range of motion.     Cervical back: Normal range of motion and neck supple.  Lymphadenopathy:     Cervical: No cervical adenopathy.  Skin:    General: Skin is warm and dry.  Neurological:     Mental Status: She is alert and oriented to person, place, and time.  Psychiatric:        Behavior: Behavior normal.        Thought Content: Thought content normal.        Judgment: Judgment normal.    Results for orders placed or performed in visit on 01/25/20  Urine Culture   Specimen: Urine   UR  Result Value Ref Range   Urine Culture, Routine Final report    Organism ID, Bacteria Comment   Microscopic Examination   Urine  Result Value Ref Range   WBC, UA 0-5 0 - 5 /hpf   RBC 0-2 0 - 2 /hpf   Epithelial Cells (non renal) 0-10 0 - 10 /hpf   Renal Epithel, UA 0-10 (A) None seen /hpf   Mucus, UA Present Not Estab.   Bacteria, UA Few None seen/Few  Urinalysis, Complete  Result Value Ref Range   Specific Gravity, UA 1.020 1.005 - 1.030   pH, UA 7.0 5.0 - 7.5   Color, UA Yellow Yellow   Appearance Ur Clear Clear   Leukocytes,UA Negative Negative   Protein,UA Negative Negative/Trace   Glucose, UA Negative Negative   Ketones, UA Negative Negative   RBC, UA Negative Negative   Bilirubin, UA Negative Negative   Urobilinogen, Ur 0.2 0.2 - 1.0 mg/dL   Nitrite, UA Negative Negative   Microscopic Examination See below:       Assessment & Plan:   Lisa Parrish was seen today for medical management of chronic issues.  Diagnoses and all orders for this visit:  Urinary retention -     Urine Culture -      Urinalysis, Complete -     Ambulatory referral to Urology -     Microscopic Examination  Lumbar radiculopathy -     oxyCODONE-acetaminophen (PERCOCET) 5-325 MG tablet; One each morning, two each afternoon and two at bedtime -     oxyCODONE-acetaminophen (PERCOCET) 5-325 MG tablet; One each morning, two each afternoon and two at bedtime -     oxyCODONE-acetaminophen (PERCOCET) 5-325 MG tablet; One each morning, two each afternoon and two at bedtime       I have discontinued  Lisa Parrish's terbinafine. I am also having her maintain her Multiple Vitamins-Minerals (HAIR/SKIN/NAILS PO), vitamin C, Magnesium, b complex vitamins, Polyethylene Glycol 3350 (MIRALAX PO), azelastine, pregabalin, chlorthalidone, clobetasol cream, cyclobenzaprine, diltiazem, escitalopram, metoprolol succinate, potassium chloride, pantoprazole, tiZANidine, buPROPion, oxyCODONE-acetaminophen, oxyCODONE-acetaminophen, and oxyCODONE-acetaminophen.  Allergies as of 01/25/2020      Reactions   Gabapentin Anxiety   Intense, alarming dreams   Amlodipine Swelling   Hydralazine    Hydrocodone    Norco   Morphine And Related Nausea And Vomiting   Promethazine    Burns through the IV   Tramadol Other (See Comments)   Upset stomach      Medication List       Accurate as of January 25, 2020 11:59 PM. If you have any questions, ask your nurse or doctor.        STOP taking these medications   terbinafine 250 MG tablet Commonly known as: LAMISIL Stopped by: Claretta Fraise, MD     TAKE these medications   azelastine 0.05 % ophthalmic solution Commonly known as: OPTIVAR PLACE 1 DROP INTO EACH EYE ONCE DAILY AS NEEDED FOR ALLERGIES   b complex vitamins capsule Take 1 capsule by mouth daily.   buPROPion 300 MG 24 hr tablet Commonly known as: WELLBUTRIN XL TAKE 1 TABLET BY MOUTH EVERY DAY   chlorthalidone 25 MG tablet Commonly known as: HYGROTON TAKE 1/2 TABLET (12.5 MG TOTAL) BY MOUTH 2 (TWO) TIMES  DAILY.   clobetasol cream 0.05 % Commonly known as: TEMOVATE Apply 1 application topically 2 (two) times daily.   cyclobenzaprine 10 MG tablet Commonly known as: FLEXERIL Take 1 tablet (10 mg total) by mouth 3 (three) times daily as needed for muscle spasms.   diltiazem 300 MG 24 hr capsule Commonly known as: CARDIZEM CD Take 1 capsule (300 mg total) by mouth daily. For blood pressure   escitalopram 10 MG tablet Commonly known as: LEXAPRO Take 1 tablet (10 mg total) by mouth daily.   HAIR/SKIN/NAILS PO Take 1 tablet by mouth daily. Reported on 03/19/2015   Magnesium 250 MG Tabs Take by mouth.   metoprolol succinate 50 MG 24 hr tablet Commonly known as: TOPROL-XL Take 1 tablet (50 mg total) by mouth daily. For blood pressure control   MIRALAX PO 2-3 times a week   oxyCODONE-acetaminophen 5-325 MG tablet Commonly known as: Percocet One each morning, two each afternoon and two at bedtime What changed: Another medication with the same name was changed. Make sure you understand how and when to take each. Changed by: Claretta Fraise, MD   oxyCODONE-acetaminophen 5-325 MG tablet Commonly known as: Percocet One each morning, two each afternoon and two at bedtime Start taking on: February 23, 2020 What changed: These instructions start on February 23, 2020. If you are unsure what to do until then, ask your doctor or other care provider. Changed by: Claretta Fraise, MD   oxyCODONE-acetaminophen 5-325 MG tablet Commonly known as: Percocet One each morning, two each afternoon and two at bedtime Start taking on: March 25, 2020 What changed: These instructions start on March 25, 2020. If you are unsure what to do until then, ask your doctor or other care provider. Changed by: Claretta Fraise, MD   pantoprazole 40 MG tablet Commonly known as: PROTONIX TAKE 1 TABLET (40 MG TOTAL) BY MOUTH 2 (TWO) TIMES DAILY BEFORE A MEAL.   potassium chloride 10 MEQ tablet Commonly known as:  KLOR-CON Take 1 tablet (10 mEq total) by mouth daily.   pregabalin  200 MG capsule Commonly known as: LYRICA Take 2 capsules (400 mg total) by mouth at bedtime.   tiZANidine 4 MG tablet Commonly known as: ZANAFLEX TAKE 1 TABLET (4 MG TOTAL) BY MOUTH 3 (THREE) TIMES DAILY.   vitamin C 1000 MG tablet Take 1,000 mg by mouth daily.        Follow-up: Return in about 3 months (around 04/26/2020).  Claretta Fraise, M.D.

## 2020-01-27 LAB — URINE CULTURE

## 2020-02-05 ENCOUNTER — Other Ambulatory Visit: Payer: Self-pay | Admitting: Family Medicine

## 2020-02-05 DIAGNOSIS — I10 Essential (primary) hypertension: Secondary | ICD-10-CM

## 2020-02-20 ENCOUNTER — Other Ambulatory Visit: Payer: Self-pay | Admitting: Family Medicine

## 2020-03-17 ENCOUNTER — Other Ambulatory Visit: Payer: Self-pay | Admitting: Family Medicine

## 2020-04-03 ENCOUNTER — Other Ambulatory Visit: Payer: Self-pay | Admitting: Family Medicine

## 2020-04-03 DIAGNOSIS — I10 Essential (primary) hypertension: Secondary | ICD-10-CM

## 2020-04-03 DIAGNOSIS — F32A Depression, unspecified: Secondary | ICD-10-CM

## 2020-04-10 ENCOUNTER — Other Ambulatory Visit: Payer: Self-pay | Admitting: Orthopedic Surgery

## 2020-04-10 DIAGNOSIS — M533 Sacrococcygeal disorders, not elsewhere classified: Secondary | ICD-10-CM

## 2020-04-11 ENCOUNTER — Other Ambulatory Visit: Payer: Self-pay | Admitting: Family Medicine

## 2020-04-13 ENCOUNTER — Other Ambulatory Visit: Payer: Self-pay | Admitting: Family Medicine

## 2020-04-19 ENCOUNTER — Other Ambulatory Visit: Payer: Self-pay

## 2020-04-19 ENCOUNTER — Ambulatory Visit
Admission: RE | Admit: 2020-04-19 | Discharge: 2020-04-19 | Disposition: A | Discharge status: Home / Self Care | Payer: BC Managed Care – PPO | Source: Ambulatory Visit | Attending: Orthopedic Surgery | Admitting: Orthopedic Surgery

## 2020-04-19 ENCOUNTER — Ambulatory Visit
Admission: RE | Admit: 2020-04-19 | Discharge: 2020-04-19 | Disposition: A | Discharge status: Home / Self Care | Payer: Worker's Compensation | Source: Ambulatory Visit | Attending: Orthopedic Surgery | Admitting: Orthopedic Surgery

## 2020-04-19 DIAGNOSIS — M533 Sacrococcygeal disorders, not elsewhere classified: Secondary | ICD-10-CM

## 2020-04-21 ENCOUNTER — Other Ambulatory Visit: Payer: Self-pay | Admitting: Family Medicine

## 2020-04-21 DIAGNOSIS — I10 Essential (primary) hypertension: Secondary | ICD-10-CM

## 2020-04-24 ENCOUNTER — Ambulatory Visit: Payer: BC Managed Care – PPO | Admitting: Family Medicine

## 2020-04-24 ENCOUNTER — Encounter: Payer: Self-pay | Admitting: Family Medicine

## 2020-04-24 ENCOUNTER — Other Ambulatory Visit: Payer: Self-pay

## 2020-04-24 VITALS — BP 152/89 | HR 78 | Temp 98.2°F | Resp 20 | Ht 66.0 in | Wt 187.0 lb

## 2020-04-24 DIAGNOSIS — I10 Essential (primary) hypertension: Secondary | ICD-10-CM | POA: Diagnosis not present

## 2020-04-24 DIAGNOSIS — M5416 Radiculopathy, lumbar region: Secondary | ICD-10-CM | POA: Diagnosis not present

## 2020-04-24 DIAGNOSIS — E785 Hyperlipidemia, unspecified: Secondary | ICD-10-CM | POA: Diagnosis not present

## 2020-04-24 DIAGNOSIS — E6609 Other obesity due to excess calories: Secondary | ICD-10-CM

## 2020-04-24 DIAGNOSIS — Z6830 Body mass index (BMI) 30.0-30.9, adult: Secondary | ICD-10-CM

## 2020-04-24 LAB — BAYER DCA HB A1C WAIVED: HB A1C (BAYER DCA - WAIVED): 5.5 % (ref <7.0)

## 2020-04-24 MED ORDER — OXYCODONE-ACETAMINOPHEN 5-325 MG PO TABS: ORAL_TABLET | ORAL | 0 refills | Status: DC

## 2020-04-24 MED ORDER — OZEMPIC (0.25 OR 0.5 MG/DOSE) 2 MG/1.5ML ~~LOC~~ SOPN
0.5000 mg | PEN_INJECTOR | SUBCUTANEOUS | 2 refills | Status: DC
Start: 2020-04-24 — End: 2020-06-14

## 2020-04-24 MED ORDER — PREGABALIN 200 MG PO CAPS: 400.0000 mg | ORAL_CAPSULE | Freq: Every day | ORAL | 1 refills | Status: DC

## 2020-04-24 NOTE — Progress Notes (Signed)
Subjective:  Patient ID: Lisa Parrish, female    DOB: March 13, 1960  Age: 60 y.o. MRN: [id number]  CC: Medical Management of Chronic Issues, Hyperlipidemia, and Hypertension   HPI Lisa Parrish presents for Pain is 7-9 all the time. 10/10 if she misses a dose of lyrica or oxy.  Patient needs refills of both of these.  PDMP shows elevated narcotic score of 581 and sedative 400 on her NARx scale.  She is receiving Tylenol 3 to use 1 twice a day from her [employer] provider.  She has been okay to use that for pain that occurs between doses of the oxycodone and not covered by the pregabalin.  Pain is currently lower back.  With radiation to the legs.  She would like to lose some weight to see if that would help with some of the pain.  She is at body mass index 30+.  Depression screen [facility] 2/9 04/24/2020 01/25/2020 01/25/2020  Decreased Interest 0 0 0  Down, Depressed, Hopeless '1 3 3  ' PHQ - 2 Score '1 3 3  ' Altered sleeping - 0 -  Tired, decreased energy - 0 -  Change in appetite - 0 -  Feeling bad or failure about yourself  - 0 -  Trouble concentrating - 0 -  Moving slowly or fidgety/restless - 0 -  Suicidal thoughts - 0 -  PHQ-9 Score - 3 -    History [name] has a past medical history of Allergy, Anxiety, Anxiety state (08/24/2012), Basal cell carcinoma, Chronic left SI joint pain, Constipation, Depression, GERD (gastroesophageal reflux disease), Headache, migraine, Hyperlipidemia, Hypertension, Kidney stones, Osteoarthritis, Pulmonary HTN ([place]) (12/21/2014), S/P cardiac cath 12/20/14 normal coronary arteries (12/21/2014), and Seasonal allergies.   She has a past surgical history that includes Hemorrhoid surgery (1989); Tubal ligation (1990); lumbar disckectomy (2006); Cardiac catheterization (N/A, 12/20/2014); Colonoscopy; Polypectomy; and Lumbar fusion (2018).   Her family history includes Cancer in her mother; Colon cancer in her paternal uncle; Colon cancer (age of onset:  76) in her paternal aunt and paternal uncle; Esophageal cancer in her brother; Heart disease in her father.She reports that she quit smoking about 28 years ago. Her smoking use included cigarettes. She has a 10.00 pack-year smoking history. She has never used smokeless tobacco. She reports that she does not drink alcohol and does not use drugs.    ROS Review of Systems  Constitutional: Negative.   HENT: Negative.   Eyes: Negative for visual disturbance.  Respiratory: Negative for shortness of breath.   Cardiovascular: Negative for chest pain.  Gastrointestinal: Negative for abdominal pain.  Musculoskeletal: Positive for arthralgias and back pain.    Objective:  BP (!) 152/89   Pulse 78   Temp 98.2 F (36.8 C)   Resp 20   Ht '5\' 6"'  (1.676 m)   Wt 187 lb (84.8 kg)   SpO2 98%   BMI 30.18 kg/m   BP Readings from Last 3 Encounters:  04/24/20 (!) 152/89  01/25/20 (!) 148/99  12/22/19 (!) 150/85    Wt Readings from Last 3 Encounters:  04/24/20 187 lb (84.8 kg)  01/25/20 183 lb 2 oz (83.1 kg)  12/22/19 186 lb (84.4 kg)     Physical Exam Constitutional:      General: She is not in acute distress.    Appearance: She is well-developed and well-nourished.  HENT:     Head: Normocephalic and atraumatic.  Eyes:     Extraocular Movements: EOM normal.     Conjunctiva/sclera: Conjunctivae normal.  Pupils: Pupils are equal, round, and reactive to light.  Cardiovascular:     Rate and Rhythm: Normal rate and regular rhythm.     Heart sounds: Normal heart sounds. No murmur heard.   Pulmonary:     Effort: Pulmonary effort is normal. No respiratory distress.     Breath sounds: Normal breath sounds. No wheezing or rales.  Abdominal:     General: Bowel sounds are normal. There is no distension.     Palpations: Abdomen is soft.     Tenderness: There is no abdominal tenderness.  Musculoskeletal:        General: Tenderness present. No edema.     Cervical back: Normal range of  motion and neck supple.     Lumbar back: Spasms and tenderness present. No deformity. Decreased range of motion. Normal pulse.  Skin:    General: Skin is warm and dry.  Neurological:     Mental Status: She is alert and oriented to person, place, and time.     Deep Tendon Reflexes: Reflexes are normal and symmetric.  Psychiatric:        Mood and Affect: Mood and affect normal.        Behavior: Behavior normal.        Thought Content: Thought content normal.       Assessment & Plan:   Zerline was seen today for medical management of chronic issues, hyperlipidemia and hypertension.  Diagnoses and all orders for this visit:  Essential hypertension -     CMP14+EGFR -     Bayer DCA Hb A1c Waived  Hyperlipidemia, unspecified hyperlipidemia type -     CMP14+EGFR -     Bayer DCA Hb A1c Waived  Lumbar radiculopathy -     oxyCODONE-acetaminophen (PERCOCET) 5-325 MG tablet; One each morning, two each afternoon and two at bedtime -     oxyCODONE-acetaminophen (PERCOCET) 5-325 MG tablet; One each morning, two each afternoon and two at bedtime -     oxyCODONE-acetaminophen (PERCOCET) 5-325 MG tablet; One each morning, two each afternoon and two at bedtime  Class 1 obesity due to excess calories with serious comorbidity and body mass index (BMI) of 30.0 to 30.9 in adult -     CMP14+EGFR -     Bayer DCA Hb A1c Waived  Other orders -     pregabalin (LYRICA) 200 MG capsule; Take 2 capsules (400 mg total) by mouth at bedtime. -     Semaglutide,0.25 or 0.5MG/DOS, (OZEMPIC, 0.25 OR 0.5 MG/DOSE,) 2 MG/1.5ML SOPN; Inject 0.5 mg into the skin once a week.       I have discontinued Kamron Harter-[name]'s Multiple Vitamins-Minerals (HAIR/SKIN/NAILS PO). I am also having her start on Ozempic (0.25 or 0.5 MG/DOSE). Additionally, I am having her maintain her vitamin C, Magnesium, b complex vitamins, Polyethylene Glycol 3350 (MIRALAX PO), azelastine, chlorthalidone, cyclobenzaprine, pantoprazole,  clobetasol cream, buPROPion, diltiazem, tiZANidine, metoprolol succinate, escitalopram, potassium chloride, Acetaminophen-Codeine, cyclobenzaprine, methocarbamol, pregabalin, oxyCODONE-acetaminophen, oxyCODONE-acetaminophen, and oxyCODONE-acetaminophen.  Allergies as of 04/24/2020      Reactions   Gabapentin Anxiety   Intense, alarming dreams   Amlodipine Swelling   Hydralazine    Hydrocodone    Norco   Morphine And Related Nausea And Vomiting   Promethazine    Burns through the IV   Tramadol Other (See Comments)   Upset stomach      Medication List       Accurate as of April 24, 2020  6:23 PM. If you have any questions,  ask your nurse or doctor.        STOP taking these medications   HAIR/SKIN/NAILS PO Stopped by: Claretta Fraise, MD     TAKE these medications   Acetaminophen-Codeine 300-30 MG tablet Take 1 tablet by mouth every 6 (six) hours as needed.   azelastine 0.05 % ophthalmic solution Commonly known as: OPTIVAR PLACE 1 DROP INTO EACH EYE ONCE DAILY AS NEEDED FOR ALLERGIES   b complex vitamins capsule Take 1 capsule by mouth daily.   buPROPion 300 MG 24 hr tablet Commonly known as: WELLBUTRIN XL TAKE 1 TABLET BY MOUTH EVERY DAY   chlorthalidone 25 MG tablet Commonly known as: HYGROTON TAKE 1/2 TABLET (12.5 MG TOTAL) BY MOUTH 2 (TWO) TIMES DAILY.   clobetasol cream 0.05 % Commonly known as: TEMOVATE APPLY TO AFFECTED AREA TWICE A DAY   cyclobenzaprine 10 MG tablet Commonly known as: FLEXERIL Take 1 tablet (10 mg total) by mouth 3 (three) times daily as needed for muscle spasms.   cyclobenzaprine 5 MG tablet Commonly known as: FLEXERIL Take 5-10 mg by mouth at bedtime as needed.   diltiazem 300 MG 24 hr capsule Commonly known as: CARDIZEM CD TAKE 1 CAPSULE (300 MG TOTAL) BY MOUTH DAILY. FOR BLOOD PRESSURE   escitalopram 10 MG tablet Commonly known as: LEXAPRO TAKE 1 TABLET BY MOUTH EVERY DAY   Magnesium 250 MG Tabs Take by mouth.    methocarbamol 500 MG tablet Commonly known as: ROBAXIN Take 500 mg by mouth every 6 (six) hours as needed.   metoprolol succinate 50 MG 24 hr tablet Commonly known as: TOPROL-XL TAKE 1 TABLET (50 MG TOTAL) BY MOUTH DAILY. FOR BLOOD PRESSURE CONTROL   MIRALAX PO 2-3 times a week   oxyCODONE-acetaminophen 5-325 MG tablet Commonly known as: Percocet One each morning, two each afternoon and two at bedtime What changed: Another medication with the same name was changed. Make sure you understand how and when to take each. Changed by: Claretta Fraise, MD   oxyCODONE-acetaminophen 5-325 MG tablet Commonly known as: Percocet One each morning, two each afternoon and two at bedtime Start taking on: May 24, 2020 What changed: These instructions start on May 24, 2020. If you are unsure what to do until then, ask your doctor or other care provider. Changed by: Claretta Fraise, MD   oxyCODONE-acetaminophen 5-325 MG tablet Commonly known as: Percocet One each morning, two each afternoon and two at bedtime Start taking on: June 23, 2020 What changed: These instructions start on June 23, 2020. If you are unsure what to do until then, ask your doctor or other care provider. Changed by: Claretta Fraise, MD   Ozempic (0.25 or 0.5 MG/DOSE) 2 MG/1.5ML Sopn Generic drug: Semaglutide(0.25 or 0.5MG/DOS) Inject 0.5 mg into the skin once a week. Started by: Claretta Fraise, MD   pantoprazole 40 MG tablet Commonly known as: PROTONIX TAKE 1 TABLET (40 MG TOTAL) BY MOUTH 2 (TWO) TIMES DAILY BEFORE A MEAL.   potassium chloride 10 MEQ tablet Commonly known as: KLOR-CON TAKE 1 TABLET BY MOUTH EVERY DAY   pregabalin 200 MG capsule Commonly known as: LYRICA Take 2 capsules (400 mg total) by mouth at bedtime.   tiZANidine 4 MG tablet Commonly known as: ZANAFLEX Take 1 tablet (4 mg total) by mouth 3 (three) times daily.   vitamin C 1000 MG tablet Take 1,000 mg by mouth daily.        Follow-up:  Return in about 3 months (around 07/22/2020).  Claretta Fraise, M.D.

## 2020-04-25 LAB — CMP14+EGFR
ALT: 45 IU/L — ABNORMAL HIGH (ref 0–32)
AST: 34 IU/L (ref 0–40)
Albumin/Globulin Ratio: 2 (ref 1.2–2.2)
Albumin: 4.9 g/dL (ref 3.8–4.9)
Alkaline Phosphatase: 90 IU/L (ref 44–121)
BUN/Creatinine Ratio: 23 (ref 9–23)
BUN: 18 mg/dL (ref 6–24)
Bilirubin Total: 0.4 mg/dL (ref 0.0–1.2)
CO2: 24 mmol/L (ref 20–29)
Calcium: 9.7 mg/dL (ref 8.7–10.2)
Chloride: 96 mmol/L (ref 96–106)
Creatinine, Ser: 0.79 mg/dL (ref 0.57–1.00)
GFR calc Af Amer: 95 mL/min/{1.73_m2} (ref >59)
GFR calc non Af Amer: 82 mL/min/{1.73_m2} (ref >59)
Globulin, Total: 2.4 g/dL (ref 1.5–4.5)
Glucose: 101 mg/dL — ABNORMAL HIGH (ref 65–99)
Potassium: 3.3 mmol/L — ABNORMAL LOW (ref 3.5–5.2)
Sodium: 139 mmol/L (ref 134–144)
Total Protein: 7.3 g/dL (ref 6.0–8.5)

## 2020-04-25 NOTE — Progress Notes (Signed)
Hello Aleasia,  Your lab result is normal and/or stable.Some minor variations that are not significant are commonly marked abnormal, but do not represent any medical problem for you.  Best regards, Claretta Fraise, M.D.

## 2020-04-26 ENCOUNTER — Ambulatory Visit: Payer: BC Managed Care – PPO | Admitting: Family Medicine

## 2020-05-05 ENCOUNTER — Other Ambulatory Visit: Payer: Self-pay | Admitting: Family Medicine

## 2020-05-05 DIAGNOSIS — I10 Essential (primary) hypertension: Secondary | ICD-10-CM

## 2020-05-05 DIAGNOSIS — K219 Gastro-esophageal reflux disease without esophagitis: Secondary | ICD-10-CM

## 2020-05-18 ENCOUNTER — Other Ambulatory Visit: Payer: Self-pay | Admitting: Family Medicine

## 2020-06-05 ENCOUNTER — Other Ambulatory Visit: Payer: Self-pay | Admitting: Orthopedic Surgery

## 2020-06-05 DIAGNOSIS — M533 Sacrococcygeal disorders, not elsewhere classified: Secondary | ICD-10-CM

## 2020-06-14 ENCOUNTER — Other Ambulatory Visit: Payer: Self-pay | Admitting: Family Medicine

## 2020-06-14 DIAGNOSIS — I10 Essential (primary) hypertension: Secondary | ICD-10-CM

## 2020-06-14 DIAGNOSIS — F32A Depression, unspecified: Secondary | ICD-10-CM

## 2020-06-19 ENCOUNTER — Ambulatory Visit
Admission: RE | Admit: 2020-06-19 | Discharge: 2020-06-19 | Disposition: A | Discharge status: Home / Self Care | Payer: Worker's Compensation | Source: Ambulatory Visit | Attending: Orthopedic Surgery | Admitting: Orthopedic Surgery

## 2020-06-19 ENCOUNTER — Other Ambulatory Visit: Payer: Self-pay

## 2020-06-19 DIAGNOSIS — M533 Sacrococcygeal disorders, not elsewhere classified: Secondary | ICD-10-CM

## 2020-06-19 MED ORDER — METHYLPREDNISOLONE ACETATE 40 MG/ML INJ SUSP (RADIOLOG: 80.0000 mg | Freq: Once | INTRAMUSCULAR | Status: DC

## 2020-06-22 ENCOUNTER — Telehealth: Payer: Self-pay

## 2020-06-22 NOTE — Telephone Encounter (Signed)
Messaged pt via [facility] explaining that she needs to be seen by Dr. Livia Snellen before a 60m supply can be given. Options to check with her insurance or use goodrx given.

## 2020-06-29 ENCOUNTER — Other Ambulatory Visit: Payer: Self-pay | Admitting: Family Medicine

## 2020-06-29 NOTE — Telephone Encounter (Signed)
Last office visit 04/24/20 Last refill 04/12/20, #90, 2 refills

## 2020-07-03 DIAGNOSIS — Z029 Encounter for administrative examinations, unspecified: Secondary | ICD-10-CM

## 2020-07-05 ENCOUNTER — Other Ambulatory Visit: Payer: Self-pay | Admitting: Family Medicine

## 2020-07-05 DIAGNOSIS — I10 Essential (primary) hypertension: Secondary | ICD-10-CM

## 2020-07-07 ENCOUNTER — Other Ambulatory Visit: Payer: Self-pay | Admitting: Family Medicine

## 2020-07-07 DIAGNOSIS — F32A Depression, unspecified: Secondary | ICD-10-CM

## 2020-07-19 ENCOUNTER — Ambulatory Visit: Payer: BC Managed Care – PPO | Admitting: Family Medicine

## 2020-07-19 ENCOUNTER — Encounter: Payer: Self-pay | Admitting: Family Medicine

## 2020-07-19 ENCOUNTER — Other Ambulatory Visit: Payer: Self-pay

## 2020-07-19 VITALS — BP 133/85 | HR 68 | Temp 98.2°F | Ht 66.0 in | Wt 176.0 lb

## 2020-07-19 DIAGNOSIS — I10 Essential (primary) hypertension: Secondary | ICD-10-CM

## 2020-07-19 DIAGNOSIS — F32A Depression, unspecified: Secondary | ICD-10-CM | POA: Diagnosis not present

## 2020-07-19 DIAGNOSIS — Z79899 Other long term (current) drug therapy: Secondary | ICD-10-CM

## 2020-07-19 DIAGNOSIS — K219 Gastro-esophageal reflux disease without esophagitis: Secondary | ICD-10-CM

## 2020-07-19 DIAGNOSIS — M5416 Radiculopathy, lumbar region: Secondary | ICD-10-CM | POA: Diagnosis not present

## 2020-07-19 MED ORDER — PANTOPRAZOLE SODIUM 40 MG PO TBEC
40.0000 mg | DELAYED_RELEASE_TABLET | Freq: Two times a day (BID) | ORAL | 1 refills | Status: DC
Start: 2020-07-19 — End: 2020-12-17

## 2020-07-19 MED ORDER — TIZANIDINE HCL 4 MG PO TABS: 4.0000 mg | ORAL_TABLET | Freq: Three times a day (TID) | ORAL | 1 refills | Status: DC

## 2020-07-19 MED ORDER — OZEMPIC (0.25 OR 0.5 MG/DOSE) 2 MG/1.5ML ~~LOC~~ SOPN: 0.5000 mg | PEN_INJECTOR | SUBCUTANEOUS | 0 refills | Status: DC

## 2020-07-19 MED ORDER — ESCITALOPRAM OXALATE 10 MG PO TABS: 1.0000 | ORAL_TABLET | Freq: Every day | ORAL | 1 refills | Status: DC

## 2020-07-19 MED ORDER — CHLORTHALIDONE 25 MG PO TABS: ORAL_TABLET | ORAL | 1 refills | Status: DC

## 2020-07-19 MED ORDER — POTASSIUM CHLORIDE ER 10 MEQ PO TBCR: 10.0000 meq | EXTENDED_RELEASE_TABLET | Freq: Every day | ORAL | 1 refills | Status: DC

## 2020-07-19 MED ORDER — OXYCODONE-ACETAMINOPHEN 5-325 MG PO TABS: ORAL_TABLET | ORAL | 0 refills | Status: DC

## 2020-07-19 MED ORDER — BUPROPION HCL ER (XL) 300 MG PO TB24
1.0000 | ORAL_TABLET | Freq: Every day | ORAL | 1 refills | Status: DC
Start: 2020-07-19 — End: 2021-02-20

## 2020-07-19 MED ORDER — METOPROLOL SUCCINATE ER 50 MG PO TB24: 50.0000 mg | ORAL_TABLET | Freq: Every day | ORAL | 1 refills | Status: DC

## 2020-07-19 MED ORDER — DILTIAZEM HCL ER COATED BEADS 300 MG PO CP24: 300.0000 mg | ORAL_CAPSULE | Freq: Every day | ORAL | 1 refills | Status: DC

## 2020-07-19 MED ORDER — PREGABALIN 200 MG PO CAPS: 400.0000 mg | ORAL_CAPSULE | Freq: Every day | ORAL | 1 refills | Status: DC

## 2020-07-19 NOTE — Progress Notes (Signed)
Subjective:  Patient ID: Lisa Parrish, female    DOB: 01/21/1961  Age: 60 y.o. MRN: [id number]  CC: Medical Management of Chronic Issues   HPI Lisa Parrish presents for  follow-up of hypertension. Patient has no history of headache chest pain or shortness of breath or recent cough. Patient also denies symptoms of TIA such as focal numbness or weakness. Patient denies side effects from medication. States taking it regularly.Hsa cut back on portions and making better choices  Pain at 6-7 in back and leg. Review of PDMP shows that the patient has filed scrips for Tramadol from her orthopedist in addition to the percocet prescribed by me. Lisa Parrish states Lisa Parrish didn't realize the tramadol was controlled.   Depression screen [facility] 2/9 07/19/2020 04/24/2020 01/25/2020 01/25/2020 12/22/2019  Decreased Interest 0 0 0 0 0  Down, Depressed, Hopeless 0 1 3 3  0  PHQ - 2 Score 0 1 3 3  0  Altered sleeping - - 0 - -  Tired, decreased energy - - 0 - -  Change in appetite - - 0 - -  Feeling bad or failure about yourself  - - 0 - -  Trouble concentrating - - 0 - -  Moving slowly or fidgety/restless - - 0 - -  Suicidal thoughts - - 0 - -  PHQ-9 Score - - 3 - -    History Lisa Parrish has a past medical history of Allergy, Anxiety, Anxiety state (08/24/2012), Basal cell carcinoma, Chronic left SI joint pain, Constipation, Depression, GERD (gastroesophageal reflux disease), Headache, migraine, Hyperlipidemia, Hypertension, Kidney stones, Osteoarthritis, Pulmonary HTN (HCC) (12/21/2014), S/P cardiac cath 12/20/14 normal coronary arteries (12/21/2014), and Seasonal allergies.   Lisa Parrish has a past surgical history that includes Hemorrhoid surgery (1989); Tubal ligation (1990); lumbar disckectomy (2006); Cardiac catheterization (N/A, 12/20/2014); Colonoscopy; Polypectomy; and Lumbar fusion (2018).   Her family history includes Cancer in her mother; Colon cancer in her paternal uncle; Colon cancer (age of onset: 35) in  her paternal aunt and paternal uncle; Esophageal cancer in her brother; Heart disease in her father.Lisa Parrish reports that Lisa Parrish quit smoking about 28 years ago. Her smoking use included cigarettes. Lisa Parrish has a 10.00 pack-year smoking history. Lisa Parrish has never used smokeless tobacco. Lisa Parrish reports that Lisa Parrish does not drink alcohol and does not use drugs.  Current Outpatient Medications on File Prior to Visit  Medication Sig Dispense Refill  . Ascorbic Acid (VITAMIN C) 1000 MG tablet Take 1,000 mg by mouth daily.    03-31[birth year]1978 azelastine (OPTIVAR) 0.05 % ophthalmic solution PLACE 1 DROP INTO EACH EYE ONCE DAILY AS NEEDED FOR ALLERGIES 18 mL 1  . b complex vitamins capsule Take 1 capsule by mouth daily.    . clobetasol cream (TEMOVATE) 0.05 % APPLY TO AFFECTED AREA TWICE A DAY 60 g 2  . cyclobenzaprine (FLEXERIL) 10 MG tablet Take 1 tablet (10 mg total) by mouth 3 (three) times daily as needed for muscle spasms. 90 tablet 1  . cyclobenzaprine (FLEXERIL) 5 MG tablet Take 5-10 mg by mouth at bedtime as needed.    . Magnesium 250 MG TABS Take by mouth.    . methocarbamol (ROBAXIN) 500 MG tablet Take 500 mg by mouth every 6 (six) hours as needed.    76 oxyCODONE-acetaminophen (PERCOCET) 5-325 MG tablet One each morning, two each afternoon and two at bedtime 150 tablet 0  . oxyCODONE-acetaminophen (PERCOCET) 5-325 MG tablet One each morning, two each afternoon and two at bedtime 150 tablet 0  . Polyethylene Glycol 3350 (  MIRALAX PO) 2-3 times a week     No current facility-administered medications on file prior to visit.    ROS Review of Systems  Constitutional: Negative.   HENT: Negative.   Eyes: Negative for visual disturbance.  Respiratory: Negative for shortness of breath.   Cardiovascular: Negative for chest pain.  Gastrointestinal: Negative for abdominal pain.  Musculoskeletal: Negative for arthralgias.    Objective:  BP 133/85   Pulse 68   Temp 98.2 F (36.8 C)   Ht 5\' 6"  (1.676 m)   Wt 176 lb (79.8 kg)    SpO2 96%   BMI 28.41 kg/m   BP Readings from Last 3 Encounters:  07/19/20 133/85  04/24/20 (!) 152/89  01/25/20 (!) 148/99    Wt Readings from Last 3 Encounters:  07/19/20 176 lb (79.8 kg)  04/24/20 187 lb (84.8 kg)  01/25/20 183 lb 2 oz (83.1 kg)     Physical Exam Constitutional:      General: Lisa Parrish is not in acute distress.    Appearance: Lisa Parrish is well-developed.  Cardiovascular:     Rate and Rhythm: Normal rate and regular rhythm.  Pulmonary:     Breath sounds: Normal breath sounds.  Skin:    General: Skin is warm and dry.  Neurological:     Mental Status: Lisa Parrish is alert and oriented to person, place, and time.       Assessment & Plan:   Lisa Parrish was seen today for medical management of chronic issues.  Diagnoses and all orders for this visit:  Controlled substance agreement signed -     ToxASSURE Select 13 (MW), Urine  Depression, unspecified depression type -     buPROPion (WELLBUTRIN XL) 300 MG 24 hr tablet; Take 1 tablet (300 mg total) by mouth daily. Dx:  Lisa Parrish  Essential hypertension -     chlorthalidone (HYGROTON) 25 MG tablet; One tablet by mouth daily -     diltiazem (CARDIZEM CD) 300 MG 24 hr capsule; Take 1 capsule (300 mg total) by mouth daily. -     metoprolol succinate (TOPROL-XL) 50 MG 24 hr tablet; Take 1 tablet (50 mg total) by mouth daily. For blood pressure control  Lumbar radiculopathy -     oxyCODONE-acetaminophen (PERCOCET) 5-325 MG tablet; One each morning, two each afternoon and two at bedtime  Gastroesophageal reflux disease without esophagitis -     pantoprazole (PROTONIX) 40 MG tablet; Take 1 tablet (40 mg total) by mouth 2 (two) times daily before a meal.  Other orders -     escitalopram (LEXAPRO) 10 MG tablet; Take 1 tablet (10 mg total) by mouth daily. -     potassium chloride (KLOR-CON) 10 MEQ tablet; Take 1 tablet (10 mEq total) by mouth daily. -     pregabalin (LYRICA) 200 MG capsule; Take 2 capsules (400 mg total) by mouth at  bedtime. -     Semaglutide,0.25 or 0.5MG /DOS, (OZEMPIC, 0.25 OR 0.5 MG/DOSE,) 2 MG/1.5ML SOPN; Inject 0.5 mg into the skin once a week. (NEEDS TO BE SEEN BEFORE NEXT REFILL) -     tiZANidine (ZANAFLEX) 4 MG tablet; Take 1 tablet (4 mg total) by mouth 3 (three) times daily.   Allergies as of 07/19/2020      Reactions   Gabapentin Anxiety   Intense, alarming dreams   Amlodipine Swelling   Hydralazine    Hydrocodone    Norco   Morphine And Related Nausea And Vomiting   Promethazine    Burns through the IV  Tramadol Other (See Comments)   Upset stomach      Medication List       Accurate as of Jul 19, 2020 11:59 PM. If you have any questions, ask your nurse or doctor.        STOP taking these medications   Acetaminophen-Codeine 300-30 MG tablet Stopped by: Claretta Fraise, MD     TAKE these medications   azelastine 0.05 % ophthalmic solution Commonly known as: OPTIVAR PLACE 1 DROP INTO EACH EYE ONCE DAILY AS NEEDED FOR ALLERGIES   b complex vitamins capsule Take 1 capsule by mouth daily.   buPROPion 300 MG 24 hr tablet Commonly known as: WELLBUTRIN XL Take 1 tablet (300 mg total) by mouth daily. Dx:  Lisa Parrish   chlorthalidone 25 MG tablet Commonly known as: HYGROTON One tablet by mouth daily What changed: See the new instructions. Changed by: Claretta Fraise, MD   clobetasol cream 0.05 % Commonly known as: TEMOVATE APPLY TO AFFECTED AREA TWICE A DAY   cyclobenzaprine 10 MG tablet Commonly known as: FLEXERIL Take 1 tablet (10 mg total) by mouth 3 (three) times daily as needed for muscle spasms.   cyclobenzaprine 5 MG tablet Commonly known as: FLEXERIL Take 5-10 mg by mouth at bedtime as needed.   diltiazem 300 MG 24 hr capsule Commonly known as: CARDIZEM CD Take 1 capsule (300 mg total) by mouth daily. What changed: additional instructions Changed by: Claretta Fraise, MD   escitalopram 10 MG tablet Commonly known as: LEXAPRO Take 1 tablet (10 mg total) by mouth  daily.   Magnesium 250 MG Tabs Take by mouth.   methocarbamol 500 MG tablet Commonly known as: ROBAXIN Take 500 mg by mouth every 6 (six) hours as needed.   metoprolol succinate 50 MG 24 hr tablet Commonly known as: TOPROL-XL Take 1 tablet (50 mg total) by mouth daily. For blood pressure control   MIRALAX PO 2-3 times a week   oxyCODONE-acetaminophen 5-325 MG tablet Commonly known as: Percocet One each morning, two each afternoon and two at bedtime   oxyCODONE-acetaminophen 5-325 MG tablet Commonly known as: Percocet One each morning, two each afternoon and two at bedtime   oxyCODONE-acetaminophen 5-325 MG tablet Commonly known as: Percocet One each morning, two each afternoon and two at bedtime   Ozempic (0.25 or 0.5 MG/DOSE) 2 MG/1.5ML Sopn Generic drug: Semaglutide(0.25 or 0.5MG /DOS) Inject 0.5 mg into the skin once a week. (NEEDS TO BE SEEN BEFORE NEXT REFILL)   pantoprazole 40 MG tablet Commonly known as: PROTONIX Take 1 tablet (40 mg total) by mouth 2 (two) times daily before a meal.   potassium chloride 10 MEQ tablet Commonly known as: KLOR-CON Take 1 tablet (10 mEq total) by mouth daily.   pregabalin 200 MG capsule Commonly known as: LYRICA Take 2 capsules (400 mg total) by mouth at bedtime.   tiZANidine 4 MG tablet Commonly known as: ZANAFLEX Take 1 tablet (4 mg total) by mouth 3 (three) times daily.   vitamin C 1000 MG tablet Take 1,000 mg by mouth daily.       Meds ordered this encounter  Medications  . buPROPion (WELLBUTRIN XL) 300 MG 24 hr tablet    Sig: Take 1 tablet (300 mg total) by mouth daily. Dx:  Lisa Parrish    Dispense:  90 tablet    Refill:  1  . chlorthalidone (HYGROTON) 25 MG tablet    Sig: One tablet by mouth daily    Dispense:  90 tablet  Refill:  1  . diltiazem (CARDIZEM CD) 300 MG 24 hr capsule    Sig: Take 1 capsule (300 mg total) by mouth daily.    Dispense:  90 capsule    Refill:  1  . escitalopram (LEXAPRO) 10 MG tablet     Sig: Take 1 tablet (10 mg total) by mouth daily.    Dispense:  90 tablet    Refill:  1  . metoprolol succinate (TOPROL-XL) 50 MG 24 hr tablet    Sig: Take 1 tablet (50 mg total) by mouth daily. For blood pressure control    Dispense:  90 tablet    Refill:  1  . oxyCODONE-acetaminophen (PERCOCET) 5-325 MG tablet    Sig: One each morning, two each afternoon and two at bedtime    Dispense:  150 tablet    Refill:  0  . pantoprazole (PROTONIX) 40 MG tablet    Sig: Take 1 tablet (40 mg total) by mouth 2 (two) times daily before a meal.    Dispense:  180 tablet    Refill:  1  . potassium chloride (KLOR-CON) 10 MEQ tablet    Sig: Take 1 tablet (10 mEq total) by mouth daily.    Dispense:  90 tablet    Refill:  1  . pregabalin (LYRICA) 200 MG capsule    Sig: Take 2 capsules (400 mg total) by mouth at bedtime.    Dispense:  180 capsule    Refill:  1  . Semaglutide,0.25 or 0.5MG /DOS, (OZEMPIC, 0.25 OR 0.5 MG/DOSE,) 2 MG/1.5ML SOPN    Sig: Inject 0.5 mg into the skin once a week. (NEEDS TO BE SEEN BEFORE NEXT REFILL)    Dispense:  3 mL    Refill:  0  . tiZANidine (ZANAFLEX) 4 MG tablet    Sig: Take 1 tablet (4 mg total) by mouth 3 (three) times daily.    Dispense:  90 tablet    Refill:  1    Pt. To follow up in one month. If there is tramadol in her system at that time Lisa Parrish will no longer bbe eligible for oxycodone from this practice.  Follow-up: Return in about 1 month (around 08/19/2020).  Claretta Fraise, M.D.

## 2020-07-20 ENCOUNTER — Other Ambulatory Visit: Payer: Self-pay | Admitting: Family Medicine

## 2020-07-22 ENCOUNTER — Encounter: Payer: Self-pay | Admitting: Family Medicine

## 2020-07-26 LAB — TOXASSURE SELECT 13 (MW), URINE

## 2020-08-10 ENCOUNTER — Telehealth: Payer: Self-pay | Admitting: Family Medicine

## 2020-08-12 ENCOUNTER — Encounter: Payer: Self-pay | Admitting: Family Medicine

## 2020-08-12 NOTE — Telephone Encounter (Signed)
Lettershould be on the printer by my office

## 2020-08-13 NOTE — Telephone Encounter (Signed)
PATIENT AWARE LETTER IS AVAILABLE FOR PICK UP

## 2020-08-23 ENCOUNTER — Other Ambulatory Visit: Payer: Self-pay

## 2020-08-23 ENCOUNTER — Encounter (HOSPITAL_COMMUNITY): Payer: Self-pay | Admitting: Emergency Medicine

## 2020-08-23 ENCOUNTER — Emergency Department (HOSPITAL_COMMUNITY)
Admission: EM | Admit: 2020-08-23 | Discharge: 2020-08-23 | Disposition: A | Discharge status: Home / Self Care | Payer: BC Managed Care – PPO | Attending: Emergency Medicine | Admitting: Emergency Medicine

## 2020-08-23 DIAGNOSIS — Z79899 Other long term (current) drug therapy: Secondary | ICD-10-CM | POA: Diagnosis not present

## 2020-08-23 DIAGNOSIS — J029 Acute pharyngitis, unspecified: Secondary | ICD-10-CM | POA: Diagnosis present

## 2020-08-23 DIAGNOSIS — Z85828 Personal history of other malignant neoplasm of skin: Secondary | ICD-10-CM | POA: Diagnosis not present

## 2020-08-23 DIAGNOSIS — R059 Cough, unspecified: Secondary | ICD-10-CM | POA: Diagnosis not present

## 2020-08-23 DIAGNOSIS — I10 Essential (primary) hypertension: Secondary | ICD-10-CM | POA: Diagnosis not present

## 2020-08-23 DIAGNOSIS — Z87891 Personal history of nicotine dependence: Secondary | ICD-10-CM | POA: Diagnosis not present

## 2020-08-23 DIAGNOSIS — Z20822 Contact with and (suspected) exposure to covid-19: Secondary | ICD-10-CM | POA: Insufficient documentation

## 2020-08-23 LAB — RESP PANEL BY RT-PCR (FLU A&B, COVID) ARPGX2
Influenza A by PCR: NEGATIVE
Influenza B by PCR: NEGATIVE
SARS Coronavirus 2 by RT PCR: NEGATIVE

## 2020-08-23 LAB — GROUP A STREP BY PCR: Group A Strep by PCR: NOT DETECTED

## 2020-08-23 MED ORDER — IBUPROFEN 800 MG PO TABS: 800.0000 mg | ORAL_TABLET | Freq: Three times a day (TID) | ORAL | 0 refills | Status: DC

## 2020-08-23 MED ORDER — LIDOCAINE VISCOUS HCL 2 % MT SOLN: 15.0000 mL | OROMUCOSAL | 0 refills | Status: DC | PRN

## 2020-08-23 MED ORDER — DEXAMETHASONE SODIUM PHOSPHATE 10 MG/ML IJ SOLN
10.0000 mg | Freq: Once | INTRAMUSCULAR | Status: AC
  Administered 2020-08-23: 10 mg
  Filled 2020-08-23: qty 1

## 2020-08-23 NOTE — ED Provider Notes (Signed)
[facility] EMERGENCY DEPARTMENT Provider Note   CSN: [id number] Arrival date & time: 08/23/20  2992     History Chief Complaint  Patient presents with   Sore Throat    Lisa Parrish is a 60 y.o. female.  Patient presents to the emergency department for evaluation of sore throat.  Patient reports that she woke up around 1:30 AM with a burning pain in her throat.  Patient reports a slight cough that is nonproductive and has had some mild nausea as well.  No fever.      Past Medical History:  Diagnosis Date   Allergy    Anxiety    Anxiety state 08/24/2012   Basal cell carcinoma    Right Leg    Chronic left SI joint pain    WORK COMP    Constipation    due to pain meds    Depression    GERD (gastroesophageal reflux disease)    Headache, migraine    Hyperlipidemia    Hypertension    Kidney stones    Osteoarthritis    back   Pulmonary HTN ([place]) 12/21/2014   S/P cardiac cath 12/20/14 normal coronary arteries 12/21/2014   Seasonal allergies     Patient Active Problem List   Diagnosis Date Noted   Opiate dependence, continuous ([place]) 10/12/2019   Back pain 02/04/2016   Lumbar radiculopathy 02/04/2016   BMI 27.0-27.9,adult 02/27/2015   Gastroesophageal reflux disease without esophagitis 02/27/2015   Depression 02/27/2015   Pulmonary HTN ([place]) 12/21/2014   Sleep apnea in adult 12/21/2014   Hyperlipidemia 12/19/2014   Chronic pain 06/12/2013   HTN (hypertension) 08/24/2012   Migraines 08/24/2012    Past Surgical History:  Procedure Laterality Date   CARDIAC CATHETERIZATION N/A 12/20/2014   Procedure: Left Heart Cath and Coronary Angiography;  Surgeon: Sherren Mocha, MD;  Location: [place] [facility];  Service: Cardiovascular;  Laterality: N/A;   COLONOSCOPY     HEMORRHOID SURGERY  1989   lumbar disckectomy  2006   LUMBAR FUSION  2018   POLYPECTOMY     TUBAL LIGATION  1990     OB History   No obstetric history on file.     Family History   Problem Relation Age of Onset   Cancer Mother    Heart disease Father    Colon cancer Paternal Aunt 37       father's half sister   Colon cancer Paternal Uncle 58       father's half brother   Esophageal cancer Brother    Colon cancer Paternal Uncle    Colon polyps Neg Hx    Rectal cancer Neg Hx    Stomach cancer Neg Hx     Social History   Tobacco Use   Smoking status: Former    Packs/day: 1.00    Years: 10.00    Pack years: 10.00    Types: Cigarettes    Quit date: 09/18/1991    Years since quitting: 28.9   Smokeless tobacco: Never  Vaping Use   Vaping Use: Never used  Substance Use Topics   Alcohol use: No   Drug use: No    Comment: no IVDA    Home Medications Prior to Admission medications   Medication Sig Start Date End Date Taking? Authorizing Provider  ibuprofen (ADVIL) 800 MG tablet Take 1 tablet (800 mg total) by mouth 3 (three) times daily. 08/23/20  Yes Kyleeann Cremeans, Gwenyth Allegra, MD  lidocaine (XYLOCAINE) 2 % solution Use as directed 15  mLs in the mouth or throat as needed for mouth pain. 08/23/20  Yes Jatoya Armbrister, Gwenyth Allegra, MD  Ascorbic Acid (VITAMIN C) 1000 MG tablet Take 1,000 mg by mouth daily.    [provider]  azelastine (OPTIVAR) 0.05 % ophthalmic solution PLACE 1 DROP INTO EACH EYE ONCE DAILY AS NEEDED FOR ALLERGIES 05/21/20   Claretta Fraise, MD  b complex vitamins capsule Take 1 capsule by mouth daily.    [provider]  buPROPion (WELLBUTRIN XL) 300 MG 24 hr tablet Take 1 tablet (300 mg total) by mouth daily. Dx:  [name] 07/19/20   Claretta Fraise, MD  chlorthalidone (HYGROTON) 25 MG tablet One tablet by mouth daily 07/19/20   Claretta Fraise, MD  clobetasol cream (TEMOVATE) 0.05 % APPLY TO AFFECTED AREA TWICE A DAY 02/20/20   Claretta Fraise, MD  cyclobenzaprine (FLEXERIL) 10 MG tablet Take 1 tablet (10 mg total) by mouth 3 (three) times daily as needed for muscle spasms. 10/12/19   Claretta Fraise, MD  cyclobenzaprine (FLEXERIL) 5 MG tablet  Take 5-10 mg by mouth at bedtime as needed. 04/20/20   [provider]  diltiazem (CARDIZEM CD) 300 MG 24 hr capsule Take 1 capsule (300 mg total) by mouth daily. 07/19/20   Claretta Fraise, MD  escitalopram (LEXAPRO) 10 MG tablet Take 1 tablet (10 mg total) by mouth daily. 07/19/20   Claretta Fraise, MD  Magnesium 250 MG TABS Take by mouth.    [provider]  methocarbamol (ROBAXIN) 500 MG tablet Take 500 mg by mouth every 6 (six) hours as needed. 04/20/20   [provider]  metoprolol succinate (TOPROL-XL) 50 MG 24 hr tablet Take 1 tablet (50 mg total) by mouth daily. For blood pressure control 07/19/20   Claretta Fraise, MD  oxyCODONE-acetaminophen [facility]) 5-325 MG tablet One each morning, two each afternoon and two at bedtime 06/23/20   Claretta Fraise, MD  oxyCODONE-acetaminophen [facility]) 5-325 MG tablet One each morning, two each afternoon and two at bedtime 05/24/20   Claretta Fraise, MD  oxyCODONE-acetaminophen (PERCOCET) 5-325 MG tablet One each morning, two each afternoon and two at bedtime 07/19/20   Claretta Fraise, MD  pantoprazole (PROTONIX) 40 MG tablet Take 1 tablet (40 mg total) by mouth 2 (two) times daily before a meal. 07/19/20   Claretta Fraise, MD  Polyethylene Glycol 3350 (MIRALAX PO) 2-3 times a week 12/25/16   [provider]  potassium chloride (KLOR-CON) 10 MEQ tablet Take 1 tablet (10 mEq total) by mouth daily. 07/19/20   Claretta Fraise, MD  pregabalin (LYRICA) 200 MG capsule Take 2 capsules (400 mg total) by mouth at bedtime. 07/19/20   Claretta Fraise, MD  Semaglutide,0.25 or 0.5MG /DOS, (OZEMPIC, 0.25 OR 0.5 MG/DOSE,) 2 MG/1.5ML SOPN Inject 0.5 mg into the skin once a week. 07/23/20   Claretta Fraise, MD  tiZANidine (ZANAFLEX) 4 MG tablet Take 1 tablet (4 mg total) by mouth 3 (three) times daily. 07/19/20   Claretta Fraise, MD    Allergies    Gabapentin, Amlodipine, Hydralazine, Hydrocodone, Morphine and related, Promethazine, and Tramadol  Review  of Systems   Review of Systems  HENT:  Positive for sore throat.   Respiratory:  Positive for cough.   Gastrointestinal:  Positive for nausea.  All other systems reviewed and are negative.  Physical Exam Updated Vital Signs BP 138/87   Pulse 63   Temp 97.6 F (36.4 C)   Resp 18   Ht 5\' 6"  (1.676 m)   Wt 79.4 kg  SpO2 95%   BMI 28.25 kg/m   Physical Exam Vitals and nursing note reviewed.  Constitutional:      General: She is not in acute distress.    Appearance: Normal appearance. She is well-developed.  HENT:     Head: Normocephalic and atraumatic.     Right Ear: Hearing normal.     Left Ear: Hearing normal.     Nose: Nose normal.  Eyes:     Conjunctiva/sclera: Conjunctivae normal.     Pupils: Pupils are equal, round, and reactive to light.  Cardiovascular:     Rate and Rhythm: Regular rhythm.     Heart sounds: S1 normal and S2 normal. No murmur heard.   No friction rub. No gallop.  Pulmonary:     Effort: Pulmonary effort is normal. No respiratory distress.     Breath sounds: Normal breath sounds.  Chest:     Chest wall: No tenderness.  Abdominal:     General: Bowel sounds are normal.     Palpations: Abdomen is soft.     Tenderness: There is no abdominal tenderness. There is no guarding or rebound. Negative signs include Murphy's sign and McBurney's sign.     Hernia: No hernia is present.  Musculoskeletal:        General: Normal range of motion.     Cervical back: Normal range of motion and neck supple.  Skin:    General: Skin is warm and dry.     Findings: No rash.  Neurological:     Mental Status: She is alert and oriented to person, place, and time.     GCS: GCS eye subscore is 4. GCS verbal subscore is 5. GCS motor subscore is 6.     Cranial Nerves: No cranial nerve deficit.     Sensory: No sensory deficit.     Coordination: Coordination normal.  Psychiatric:        Speech: Speech normal.        Behavior: Behavior normal.        Thought Content:  Thought content normal.    ED Results / Procedures / Treatments   Labs (all labs ordered are listed, but only abnormal results are displayed) Labs Reviewed  GROUP A STREP BY PCR  RESP PANEL BY RT-PCR (FLU A&B, COVID) [id number]    EKG None  Radiology No results found.  Procedures Procedures   Medications Ordered in ED Medications  dexamethasone (DECADRON) injection 10 mg (10 mg Other Given 08/23/20 0457)    ED Course  I have reviewed the triage vital signs and the nursing notes.  Pertinent labs & imaging results that were available during my care of the patient were reviewed by me and considered in my medical decision making (see chart for details).    MDM Rules/Calculators/A&P                          Patient with mild URI symptoms including sore throat.  Oropharyngeal examination is clear.  No sign of abscess.  Strep negative, COVID-negative.  Treat symptomatically. Final Clinical Impression(s) / ED Diagnoses Final diagnoses:  Viral pharyngitis    Rx / DC Orders ED Discharge Orders          Ordered    lidocaine (XYLOCAINE) 2 % solution  As needed        08/23/20 0435    ibuprofen (ADVIL) 800 MG tablet  3 times daily        08/23/20 0435  Orpah Greek, MD 08/24/20 [phone]

## 2020-08-23 NOTE — ED Triage Notes (Signed)
Pt c/o sore throat, cough and nausea.

## 2020-08-27 ENCOUNTER — Ambulatory Visit: Payer: BC Managed Care – PPO | Admitting: Family Medicine

## 2020-08-27 ENCOUNTER — Encounter: Payer: Self-pay | Admitting: Family Medicine

## 2020-08-27 ENCOUNTER — Other Ambulatory Visit: Payer: Self-pay

## 2020-08-27 VITALS — BP 140/91 | HR 83 | Temp 97.4°F | Ht 66.0 in | Wt 172.6 lb

## 2020-08-27 DIAGNOSIS — M5416 Radiculopathy, lumbar region: Secondary | ICD-10-CM | POA: Diagnosis not present

## 2020-08-27 DIAGNOSIS — F1120 Opioid dependence, uncomplicated: Secondary | ICD-10-CM | POA: Diagnosis not present

## 2020-08-27 MED ORDER — CLOBETASOL PROPIONATE 0.05 % EX CREA: TOPICAL_CREAM | CUTANEOUS | 3 refills | Status: AC

## 2020-08-27 MED ORDER — OXYCODONE-ACETAMINOPHEN 5-325 MG PO TABS: ORAL_TABLET | ORAL | 0 refills | Status: DC

## 2020-08-27 MED ORDER — AZELASTINE HCL 0.05 % OP SOLN: OPHTHALMIC | 1 refills | Status: DC

## 2020-08-27 NOTE — Progress Notes (Signed)
Chief Complaint  Patient presents with   Follow-up    1 month with toxassure    HPI  Patient presents today for follow-up on chronic pain management.  She was brought in for early follow-up because of the discrepancy on PDMP.  This showed that she has been getting tramadol from another source.  See my note from last month.  Today she is in for recheck of toxassure to make sure that there is nothing like tramadol or other controlled substances in the urine.  Additionally for recheck of the PDMP.  Of note is the PDMP shows that she is refilled nothing but the drugs authorized by me.  She did run out about 3 days ago and has been in 8-10/10 pain.  Normally her pain runs 4/10-6/10.  PMH: Smoking status noted ROS: Per HPI  Objective: BP (!) 140/91   Pulse 83   Temp (!) 97.4 F (36.3 C)   Ht 5\' 6"  (1.676 m)   Wt 172 lb 9.6 oz (78.3 kg)   SpO2 98%   BMI 27.86 kg/m  Gen: NAD, alert, cooperative with exam HEENT: NCAT, EOMI, PERRL CV: RRR, good S1/S2, no murmur Resp: CTABL, no wheezes, non-labored Abd: SNTND, BS present, no guarding or organomegaly Ext: No edema, warm Neuro: Alert and oriented, No gross deficits  Assessment and plan:  1. Lumbar radiculopathy   2. Uncomplicated opioid dependence ([place])     Meds ordered this encounter  Medications   azelastine (OPTIVAR) 0.05 % ophthalmic solution    Sig: PLACE 1 DROP INTO EACH EYE ONCE DAILY AS NEEDED FOR ALLERGIES    Dispense:  18 mL    Refill:  1   clobetasol cream (TEMOVATE) 0.05 %    Sig: APPLY TO AFFECTED AREA TWICE A DAY    Dispense:  60 g    Refill:  3   oxyCODONE-acetaminophen (PERCOCET) 5-325 MG tablet    Sig: One each morning, two each afternoon and two at bedtime    Dispense:  150 tablet    Refill:  0    Orders Placed This Encounter  Procedures   ToxASSURE Select 13 (MW), Urine    Follow up as needed.  Claretta Fraise, MD

## 2020-08-28 ENCOUNTER — Observation Stay (HOSPITAL_COMMUNITY)
Admission: EM | Admit: 2020-08-28 | Discharge: 2020-08-29 | Disposition: A | Discharge status: Home / Self Care | Payer: BC Managed Care – PPO | Attending: Internal Medicine | Admitting: Internal Medicine

## 2020-08-28 ENCOUNTER — Other Ambulatory Visit: Payer: Self-pay

## 2020-08-28 ENCOUNTER — Emergency Department (HOSPITAL_COMMUNITY): Payer: BC Managed Care – PPO

## 2020-08-28 ENCOUNTER — Encounter (HOSPITAL_COMMUNITY): Payer: Self-pay | Admitting: *Deleted

## 2020-08-28 DIAGNOSIS — Z20822 Contact with and (suspected) exposure to covid-19: Secondary | ICD-10-CM | POA: Diagnosis not present

## 2020-08-28 DIAGNOSIS — Z87891 Personal history of nicotine dependence: Secondary | ICD-10-CM | POA: Insufficient documentation

## 2020-08-28 DIAGNOSIS — I10 Essential (primary) hypertension: Secondary | ICD-10-CM | POA: Diagnosis not present

## 2020-08-28 DIAGNOSIS — R0789 Other chest pain: Principal | ICD-10-CM | POA: Insufficient documentation

## 2020-08-28 DIAGNOSIS — E876 Hypokalemia: Secondary | ICD-10-CM | POA: Diagnosis not present

## 2020-08-28 DIAGNOSIS — G8929 Other chronic pain: Secondary | ICD-10-CM | POA: Diagnosis present

## 2020-08-28 DIAGNOSIS — K219 Gastro-esophageal reflux disease without esophagitis: Secondary | ICD-10-CM | POA: Diagnosis present

## 2020-08-28 DIAGNOSIS — Z85828 Personal history of other malignant neoplasm of skin: Secondary | ICD-10-CM | POA: Diagnosis not present

## 2020-08-28 DIAGNOSIS — E785 Hyperlipidemia, unspecified: Secondary | ICD-10-CM | POA: Insufficient documentation

## 2020-08-28 DIAGNOSIS — Z79899 Other long term (current) drug therapy: Secondary | ICD-10-CM | POA: Diagnosis not present

## 2020-08-28 DIAGNOSIS — R079 Chest pain, unspecified: Secondary | ICD-10-CM | POA: Diagnosis present

## 2020-08-28 LAB — HEPATIC FUNCTION PANEL
ALT: 23 U/L (ref 0–44)
AST: 17 U/L (ref 15–41)
Albumin: 4.2 g/dL (ref 3.5–5.0)
Alkaline Phosphatase: 69 U/L (ref 38–126)
Bilirubin, Direct: 0.1 mg/dL (ref 0.0–0.2)
Indirect Bilirubin: 0.7 mg/dL (ref 0.3–0.9)
Total Bilirubin: 0.8 mg/dL (ref 0.3–1.2)
Total Protein: 6.9 g/dL (ref 6.5–8.1)

## 2020-08-28 LAB — TROPONIN I (HIGH SENSITIVITY)
Troponin I (High Sensitivity): 3 ng/L (ref <18)
Troponin I (High Sensitivity): 3 ng/L (ref <18)
Troponin I (High Sensitivity): 3 ng/L (ref <18)
Troponin I (High Sensitivity): 3 ng/L (ref <18)

## 2020-08-28 LAB — BASIC METABOLIC PANEL
Anion gap: 9 (ref 5–15)
BUN: 23 mg/dL — ABNORMAL HIGH (ref 6–20)
CO2: 28 mmol/L (ref 22–32)
Calcium: 9.3 mg/dL (ref 8.9–10.3)
Chloride: 98 mmol/L (ref 98–111)
Creatinine, Ser: 0.79 mg/dL (ref 0.44–1.00)
GFR, Estimated: >60 mL/min (ref >60)
Glucose, Bld: 90 mg/dL (ref 70–99)
Potassium: 3.1 mmol/L — ABNORMAL LOW (ref 3.5–5.1)
Sodium: 135 mmol/L (ref 135–145)

## 2020-08-28 LAB — CBC
HCT: 40.6 % (ref 36.0–46.0)
Hemoglobin: 13.7 g/dL (ref 12.0–15.0)
MCH: 29.8 pg (ref 26.0–34.0)
MCHC: 33.7 g/dL (ref 30.0–36.0)
MCV: 88.5 fL (ref 80.0–100.0)
Platelets: 276 10*3/uL (ref 150–400)
RBC: 4.59 MIL/uL (ref 3.87–5.11)
RDW: 13.1 % (ref 11.5–15.5)
WBC: 8.1 10*3/uL (ref 4.0–10.5)
nRBC: 0 % (ref 0.0–0.2)

## 2020-08-28 LAB — MAGNESIUM: Magnesium: 1.9 mg/dL (ref 1.7–2.4)

## 2020-08-28 LAB — LIPASE, BLOOD: Lipase: 27 U/L (ref 11–51)

## 2020-08-28 MED ORDER — POTASSIUM CHLORIDE CRYS ER 20 MEQ PO TBCR
40.0000 meq | EXTENDED_RELEASE_TABLET | Freq: Once | ORAL | Status: AC
  Administered 2020-08-28: 40 meq via ORAL
  Filled 2020-08-28: qty 2

## 2020-08-28 NOTE — ED Provider Notes (Signed)
Emergency Medicine Provider Triage Evaluation Note  Lisa Parrish , a 60 y.o. female  was evaluated in triage.  Pt complains of chest tightness.  She states that her symptoms started around 3 PM.  They were constant, central, nonradiating, and describes it as a squeezing pain.  States it was 9/10.  No modifying factors.  No nausea, vomiting, diaphoresis, or shortness of breath while her pain occurred.  She states her pain stopped about 1 to 1.5 hours later.  Since then, she has had a mild headache as well as nausea.  She is a prior smoker and quit more than 30 years ago.  Reports a history of hypertension and takes chlorthalidone as well as diltiazem, which she states she took this morning.  No history of first-degree relatives with cardiac history.  Patient also does note mild upper abdominal pain.  No other complaints.  Physical Exam  Pulse 79   Temp 98.3 F (36.8 C) (Oral)   SpO2 99%  Gen:   Awake, no distress   Resp:  Normal effort  MSK:   Moves extremities without difficulty  Other:    Medical Decision Making  Medically screening exam initiated at 5:23 PM.  Appropriate orders placed.  Lisa Parrish was informed that the remainder of the evaluation will be completed by another provider, this initial triage assessment does not replace that evaluation, and the importance of remaining in the ED until their evaluation is complete.     Lisa Sexton, PA-C 08/28/20 1725    Lisa Ferguson, MD 08/31/20 1026

## 2020-08-28 NOTE — ED Triage Notes (Signed)
Chest pain onset 3pm today

## 2020-08-28 NOTE — ED Provider Notes (Signed)
[facility] EMERGENCY DEPARTMENT Provider Note   CSN: [id number] Arrival date & time: 08/28/20  1629     History Chief Complaint  Patient presents with   Chest Pain    Lisa Parrish is a 60 y.o. female with past medical history of hypertension, hyperlipidemia, GERD and elevated BMI, who presents today for evaluation of chest pressure.  She states that at about 3 PM today she was watching TV and had onset of constant, central nonradiating squeezing pain that she describes as under her ribs.  She states her pain stopped about 1 hour later.  She is unable to identify any clear provoking or alleviating factors.  She did not take any medicines to help this.  She states that her blood pressure was in the 180s when this was happening.  She denies any recent surgeries or immobilizations.  No leg swelling.  No cough.  While she was having pain she did not have any nausea, vomiting, diaphoresis or shortness of breath.  She denies any cocaine use.  She has a remote history of smoking.  She denies any spicy foods recently.  She reports that she has not missed any doses of her medications.   HPI    Past Medical History:  Diagnosis Date   Allergy    Anxiety    Anxiety state 08/24/2012   Basal cell carcinoma    Right Leg    Chronic left SI joint pain    WORK COMP    Constipation    due to pain meds    Depression    GERD (gastroesophageal reflux disease)    Headache, migraine    Hyperlipidemia    Hypertension    Kidney stones    Osteoarthritis    back   Pulmonary HTN ([place]) 12/21/2014   S/P cardiac cath 12/20/14 normal coronary arteries 12/21/2014   Seasonal allergies     Patient Active Problem List   Diagnosis Date Noted   Chest pain 08/28/2020   Opiate dependence, continuous ([place]) 10/12/2019   Back pain 02/04/2016   Lumbar radiculopathy 02/04/2016   BMI 27.0-27.9,adult 02/27/2015   Gastroesophageal reflux disease without esophagitis 02/27/2015   Depression 02/27/2015    Pulmonary HTN ([place]) 12/21/2014   Sleep apnea in adult 12/21/2014   Hyperlipidemia 12/19/2014   Chronic pain 06/12/2013   HTN (hypertension) 08/24/2012   Migraines 08/24/2012    Past Surgical History:  Procedure Laterality Date   CARDIAC CATHETERIZATION N/A 12/20/2014   Procedure: Left Heart Cath and Coronary Angiography;  Surgeon: Sherren Mocha, MD;  Location: [place] [facility];  Service: Cardiovascular;  Laterality: N/A;   COLONOSCOPY     HEMORRHOID SURGERY  1989   lumbar disckectomy  2006   LUMBAR FUSION  2018   POLYPECTOMY     TUBAL LIGATION  1990     OB History   No obstetric history on file.     Family History  Problem Relation Age of Onset   Cancer Mother    Heart disease Father    Colon cancer Paternal Aunt 20       father's half sister   Colon cancer Paternal Uncle 85       father's half brother   Esophageal cancer Brother    Colon cancer Paternal Uncle    Colon polyps Neg Hx    Rectal cancer Neg Hx    Stomach cancer Neg Hx     Social History   Tobacco Use   Smoking status: Former    Packs/day:  1.00    Years: 10.00    Pack years: 10.00    Types: Cigarettes    Quit date: 09/18/1991    Years since quitting: 28.9   Smokeless tobacco: Never  Vaping Use   Vaping Use: Never used  Substance Use Topics   Alcohol use: No   Drug use: No    Comment: no IVDA    Home Medications Prior to Admission medications   Medication Sig Start Date End Date Taking? Authorizing Provider  Ascorbic Acid (VITAMIN C) 1000 MG tablet Take 1,000 mg by mouth daily.   Yes [provider]  azelastine (OPTIVAR) 0.05 % ophthalmic solution PLACE 1 DROP INTO EACH EYE ONCE DAILY AS NEEDED FOR ALLERGIES 08/27/20  Yes Stacks, Cletus Gash, MD  b complex vitamins capsule Take 1 capsule by mouth daily.   Yes [provider]  buPROPion (WELLBUTRIN XL) 300 MG 24 hr tablet Take 1 tablet (300 mg total) by mouth daily. Dx:  [name] 07/19/20  Yes Stacks, Cletus Gash, MD  chlorthalidone  (HYGROTON) 25 MG tablet One tablet by mouth daily 07/19/20  Yes Stacks, Cletus Gash, MD  clobetasol cream (TEMOVATE) 0.05 % APPLY TO AFFECTED AREA TWICE A DAY 08/27/20  Yes Stacks, Cletus Gash, MD  cyclobenzaprine (FLEXERIL) 5 MG tablet Take 5-10 mg by mouth at bedtime as needed. 04/20/20  Yes [provider]  diltiazem (CARDIZEM CD) 300 MG 24 hr capsule Take 1 capsule (300 mg total) by mouth daily. 07/19/20  Yes Stacks, Cletus Gash, MD  escitalopram (LEXAPRO) 10 MG tablet Take 1 tablet (10 mg total) by mouth daily. 07/19/20  Yes Claretta Fraise, MD  ibuprofen (ADVIL) 800 MG tablet Take 1 tablet (800 mg total) by mouth 3 (three) times daily. 08/23/20  Yes Pollina, Gwenyth Allegra, MD  lidocaine (XYLOCAINE) 2 % solution Use as directed 15 mLs in the mouth or throat as needed for mouth pain. 08/23/20  Yes Pollina, Gwenyth Allegra, MD  oxyCODONE-acetaminophen (PERCOCET) 5-325 MG tablet One each morning, two each afternoon and two at bedtime 06/23/20  Yes Stacks, Cletus Gash, MD  pantoprazole (PROTONIX) 40 MG tablet Take 1 tablet (40 mg total) by mouth 2 (two) times daily before a meal. Patient taking differently: Take 40 mg by mouth daily. 07/19/20  Yes Claretta Fraise, MD  Polyethylene Glycol 3350 (MIRALAX PO) 2-3 times a week 12/25/16  Yes [provider]  potassium chloride (KLOR-CON) 10 MEQ tablet Take 1 tablet (10 mEq total) by mouth daily. 07/19/20  Yes Stacks, Cletus Gash, MD  pregabalin (LYRICA) 200 MG capsule Take 2 capsules (400 mg total) by mouth at bedtime. 07/19/20  Yes Claretta Fraise, MD  Semaglutide,0.25 or 0.5MG /DOS, (OZEMPIC, 0.25 OR 0.5 MG/DOSE,) 2 MG/1.5ML SOPN Inject 0.5 mg into the skin once a week. 07/23/20  Yes Stacks, Cletus Gash, MD  tiZANidine (ZANAFLEX) 4 MG tablet Take 1 tablet (4 mg total) by mouth 3 (three) times daily. 07/19/20  Yes Claretta Fraise, MD  cyclobenzaprine (FLEXERIL) 10 MG tablet Take 1 tablet (10 mg total) by mouth 3 (three) times daily as needed for muscle spasms. Patient not taking: No  sig reported 10/12/19   Claretta Fraise, MD  Magnesium 250 MG TABS Take by mouth. Patient not taking: No sig reported    [provider]  methocarbamol (ROBAXIN) 500 MG tablet Take 500 mg by mouth every 6 (six) hours as needed. Patient not taking: No sig reported 04/20/20   [provider]  metoprolol succinate (TOPROL-XL) 50 MG 24 hr tablet Take 1 tablet (50 mg total) by mouth  daily. For blood pressure control Patient not taking: No sig reported 07/19/20   Claretta Fraise, MD  oxyCODONE-acetaminophen (PERCOCET) 5-325 MG tablet One each morning, two each afternoon and two at bedtime Patient not taking: No sig reported 07/19/20   Claretta Fraise, MD  oxyCODONE-acetaminophen (PERCOCET) 5-325 MG tablet One each morning, two each afternoon and two at bedtime Patient not taking: No sig reported 08/27/20   Claretta Fraise, MD    Allergies    Gabapentin, Amlodipine, Hydralazine, Hydrocodone, Morphine and related, Promethazine, and Tramadol  Review of Systems   Review of Systems  Constitutional:  Negative for diaphoresis, fatigue and fever.  Eyes:  Negative for visual disturbance.  Respiratory:  Negative for cough and shortness of breath.   Cardiovascular:  Positive for chest pain. Negative for palpitations and leg swelling.  Gastrointestinal:  Negative for abdominal pain, diarrhea, nausea and vomiting.  Skin:  Negative for rash.  Neurological:  Negative for dizziness, syncope, weakness and headaches.  All other systems reviewed and are negative.  Physical Exam Updated Vital Signs BP (!) 148/86   Pulse 64   Temp 98.2 F (36.8 C) (Oral)   Resp 14   Ht 5\' 6"  (1.676 m)   Wt 78 kg   SpO2 95%   BMI 27.76 kg/m   Physical Exam Vitals and nursing note reviewed.  Constitutional:      General: She is not in acute distress.    Appearance: She is not ill-appearing.  HENT:     Head: Atraumatic.  Eyes:     Conjunctiva/sclera: Conjunctivae normal.  Cardiovascular:     Rate and  Rhythm: Normal rate.     Heart sounds: Normal heart sounds.  Pulmonary:     Effort: Pulmonary effort is normal. No respiratory distress.     Breath sounds: Normal breath sounds.  Chest:     Chest wall: No tenderness.  Abdominal:     General: There is no distension.     Palpations: Abdomen is soft.     Tenderness: There is abdominal tenderness (Reports this feels different than her pain and is in different location.) in the epigastric area. There is no guarding or rebound.  Musculoskeletal:     Cervical back: Normal range of motion and neck supple.     Right lower leg: No tenderness. No edema.     Left lower leg: No tenderness. No edema.     Comments: No obvious acute injury  Skin:    General: Skin is warm.  Neurological:     Mental Status: She is alert.     Comments: Awake and alert, answers all questions appropriately.  Speech is not slurred.  Psychiatric:        Mood and Affect: Mood normal.        Behavior: Behavior normal.    ED Results / Procedures / Treatments   Labs (all labs ordered are listed, but only abnormal results are displayed) Labs Reviewed  BASIC METABOLIC PANEL - Abnormal; Notable for the following components:      Result Value   Potassium 3.1 (*)    BUN 23 (*)    All other components within normal limits  RESP PANEL BY RT-PCR (FLU A&B, COVID) ARPGX2  CBC  HEPATIC FUNCTION PANEL  LIPASE, BLOOD  MAGNESIUM  TROPONIN I (HIGH SENSITIVITY)  TROPONIN I (HIGH SENSITIVITY)  TROPONIN I (HIGH SENSITIVITY)  TROPONIN I (HIGH SENSITIVITY)    EKG None  Radiology DG Chest 2 View  Result Date: 08/28/2020 CLINICAL DATA:  Chest pain EXAM: CHEST - 2 VIEW COMPARISON:  02/18/2016 FINDINGS: The heart size and mediastinal contours are within normal limits. Aortic atherosclerosis. Both lungs are clear. The visualized skeletal structures are unremarkable. IMPRESSION: No active cardiopulmonary disease. Electronically Signed   By: Donavan Foil M.D.   On: 08/28/2020 18:16     Procedures Procedures   Medications Ordered in ED Medications  potassium chloride SA (KLOR-CON) CR tablet 40 mEq (40 mEq Oral Given 08/28/20 2027)    ED Course  I have reviewed the triage vital signs and the nursing notes.  Pertinent labs & imaging results that were available during my care of the patient were reviewed by me and considered in my medical decision making (see chart for details).    MDM Rules/Calculators/A&P HEAR Score: 5                       Lisa Parrish is a 60 year old woman who presents today for evaluation of 1.5 hours of substernal chest pressure that started at rest. She is pain-free throughout her entire time in the emergency room.  Multiple troponins are obtained and are not elevated.  She is hypokalemic with a potassium of 3.1 which is orally repleted.  Chest x-ray is unremarkable. Her EKG does show some nonspecific ST changes which appear to be new from her previous which was in 2016.  Given her nonspecific ST and T wave changes, episode of chest pain, and risk factors patient's heart score is 5.  Patient recommended for admission.    I spoke with hospitalist who will see patient for admission.   The patient appears reasonably stabilized for admission considering the current resources, flow, and capabilities available in the ED at this time, and I doubt any other [facility] requiring further screening and/or treatment in the ED prior to admission assuming timely admission and bed placement.  Note: Portions of this report may have been transcribed using voice recognition software. Every effort was made to ensure accuracy; however, inadvertent computerized transcription errors may be present   Final Clinical Impression(s) / ED Diagnoses Final diagnoses:  Chest pain, unspecified type  Primary hypertension  Hyperlipidemia, unspecified hyperlipidemia type    Rx / DC Orders ED Discharge Orders     None        Ollen Gross 08/28/20  2358    [name], MD 08/31/20 1028

## 2020-08-29 ENCOUNTER — Other Ambulatory Visit (HOSPITAL_COMMUNITY): Payer: Self-pay | Admitting: *Deleted

## 2020-08-29 ENCOUNTER — Encounter (HOSPITAL_COMMUNITY): Payer: BC Managed Care – PPO

## 2020-08-29 ENCOUNTER — Observation Stay (HOSPITAL_BASED_OUTPATIENT_CLINIC_OR_DEPARTMENT_OTHER): Payer: BC Managed Care – PPO

## 2020-08-29 DIAGNOSIS — R079 Chest pain, unspecified: Secondary | ICD-10-CM | POA: Diagnosis not present

## 2020-08-29 DIAGNOSIS — G8929 Other chronic pain: Secondary | ICD-10-CM | POA: Diagnosis not present

## 2020-08-29 DIAGNOSIS — E876 Hypokalemia: Secondary | ICD-10-CM

## 2020-08-29 DIAGNOSIS — R0789 Other chest pain: Secondary | ICD-10-CM

## 2020-08-29 DIAGNOSIS — E785 Hyperlipidemia, unspecified: Secondary | ICD-10-CM | POA: Diagnosis not present

## 2020-08-29 DIAGNOSIS — I10 Essential (primary) hypertension: Secondary | ICD-10-CM

## 2020-08-29 LAB — HIV ANTIBODY (ROUTINE TESTING W REFLEX): HIV Screen 4th Generation wRfx: NONREACTIVE

## 2020-08-29 LAB — LIPID PANEL
Cholesterol: 208 mg/dL — ABNORMAL HIGH (ref 0–200)
HDL: 52 mg/dL (ref >40)
LDL Cholesterol: 136 mg/dL — ABNORMAL HIGH (ref 0–99)
Total CHOL/HDL Ratio: 4 RATIO
Triglycerides: 99 mg/dL (ref <150)
VLDL: 20 mg/dL (ref 0–40)

## 2020-08-29 LAB — ECHOCARDIOGRAM COMPLETE
AR max vel: 2.54 cm2
AV Area VTI: 2.93 cm2
AV Area mean vel: 2.85 cm2
AV Mean grad: 5 mmHg
AV Peak grad: 10.9 mmHg
Ao pk vel: 1.65 m/s
Area-P 1/2: 3.31 cm2
Height: 66 in
MV VTI: 3.05 cm2
S' Lateral: 2.93 cm
Weight: 2752 oz

## 2020-08-29 LAB — RESP PANEL BY RT-PCR (FLU A&B, COVID) ARPGX2
Influenza A by PCR: NEGATIVE
Influenza B by PCR: NEGATIVE
SARS Coronavirus 2 by RT PCR: NEGATIVE

## 2020-08-29 MED ORDER — PANTOPRAZOLE SODIUM 40 MG PO TBEC
40.0000 mg | DELAYED_RELEASE_TABLET | Freq: Two times a day (BID) | ORAL | Status: DC
  Administered 2020-08-29 (×2): 40 mg via ORAL
  Filled 2020-08-29 (×2): qty 1

## 2020-08-29 MED ORDER — DILTIAZEM HCL ER COATED BEADS 180 MG PO CP24
300.0000 mg | ORAL_CAPSULE | Freq: Every day | ORAL | Status: DC
  Administered 2020-08-29: 300 mg via ORAL
  Filled 2020-08-29: qty 1

## 2020-08-29 MED ORDER — TIZANIDINE HCL 4 MG PO TABS
4.0000 mg | ORAL_TABLET | Freq: Three times a day (TID) | ORAL | Status: DC
  Administered 2020-08-29 (×2): 4 mg via ORAL
  Filled 2020-08-29 (×2): qty 1

## 2020-08-29 MED ORDER — ONDANSETRON HCL 4 MG/2ML IJ SOLN: 4.0000 mg | Freq: Four times a day (QID) | INTRAMUSCULAR | Status: DC | PRN

## 2020-08-29 MED ORDER — ROSUVASTATIN CALCIUM 10 MG PO TABS: 10.0000 mg | ORAL_TABLET | Freq: Every day | ORAL | Status: DC

## 2020-08-29 MED ORDER — CHLORTHALIDONE 25 MG PO TABS
25.0000 mg | ORAL_TABLET | Freq: Every day | ORAL | Status: DC
  Administered 2020-08-29: 25 mg via ORAL
  Filled 2020-08-29: qty 1

## 2020-08-29 MED ORDER — OXYCODONE-ACETAMINOPHEN 5-325 MG PO TABS
1.0000 | ORAL_TABLET | Freq: Four times a day (QID) | ORAL | Status: DC | PRN
  Administered 2020-08-29 (×3): 1 via ORAL
  Filled 2020-08-29 (×3): qty 1

## 2020-08-29 MED ORDER — PREGABALIN 75 MG PO CAPS
400.0000 mg | ORAL_CAPSULE | Freq: Every day | ORAL | Status: DC
  Administered 2020-08-29: 400 mg via ORAL
  Filled 2020-08-29: qty 2

## 2020-08-29 MED ORDER — BUPROPION HCL ER (XL) 300 MG PO TB24
300.0000 mg | ORAL_TABLET | Freq: Every day | ORAL | Status: DC
  Administered 2020-08-29: 300 mg via ORAL
  Filled 2020-08-29: qty 1

## 2020-08-29 MED ORDER — HEPARIN SODIUM (PORCINE) 5000 UNIT/ML IJ SOLN
5000.0000 [IU] | Freq: Three times a day (TID) | INTRAMUSCULAR | Status: DC
  Administered 2020-08-29 (×2): 5000 [IU] via SUBCUTANEOUS
  Filled 2020-08-29 (×2): qty 1

## 2020-08-29 MED ORDER — ACETAMINOPHEN 325 MG PO TABS
650.0000 mg | ORAL_TABLET | ORAL | Status: DC | PRN
  Filled 2020-08-29: qty 2

## 2020-08-29 MED ORDER — ALUM & MAG HYDROXIDE-SIMETH 200-200-20 MG/5ML PO SUSP
30.0000 mL | Freq: Once | ORAL | Status: AC
  Administered 2020-08-29: 30 mL via ORAL
  Filled 2020-08-29 (×2): qty 30

## 2020-08-29 MED ORDER — ESCITALOPRAM OXALATE 10 MG PO TABS
10.0000 mg | ORAL_TABLET | Freq: Every day | ORAL | Status: DC
  Administered 2020-08-29: 10 mg via ORAL
  Filled 2020-08-29: qty 1

## 2020-08-29 NOTE — Consult Note (Addendum)
Cardiology Consultation:   Patient ID: Lisa Parrish MRN: [id number]; DOB: [birth year]  Admit date: 08/28/2020 Date of Consult: 08/29/2020  PCP:  Claretta Fraise, MD   [facility] Providers Cardiologist: Previously followed by Dr. Bronson Ing   Patient Profile:   Lisa Parrish is a 60 y.o. female with past medical history of atypical chest pain (normal cors by cath in 2016), HTN, HLD, GERD, anxiety and depression who is being seen 08/29/2020 for the evaluation of chest pain at the request of Dr. Clearence Ped.  History of Present Illness:   Lisa Parrish most recently had a telehealth visit with Dr. Bronson Ing in 03/2019 and reported intermittent lower extremity edema but denied any chest pain or dyspnea. Given her lower extremity edema, it was recommended that she stop Cardizem and start Hydralazine 25 mg TID (appears this change was never made as she has remained on Cardizem).   She presented to [facility] ED on 08/28/2020 for evaluation of chest pain which had started earlier in the day. She reports being evaluated in the ED last week for a sore throat and it was thought her symptoms might have been secondary to a URI as they resolved with Lidocaine solution. She had overall been feeling well until yesterday when she developed a tightness along her sternum which occurred while watching television. Denies any associated nausea, vomiting, dyspnea or diaphoresis. Her episode lasted for almost 1.5 hours then resolved. Pain was not worse with exertion or positional changes. Her pain resolved yesterday afternoon and she denies any recurrent symptoms overnight or this morning. She is not active at baseline due to SI joint pain but says she is able to do chores around her home and walk around the grocery store without any anginal symptoms.   Initial labs showed WBC 8.1, Hgb 13.7, platelets 276, Na+ 135, K+ 3.1 and creatinine 0.79.  AST 17 and ALT 23.  COVID-negative.  Initial and repeat  Hs Troponin values have all been negative at 3. CXR with no active disease. EKG shows NSR, HR 79 with slight TWI along the anterior leads which is similar to prior tracings from 2016.  Past Medical History:  Diagnosis Date   Allergy    Anxiety    Anxiety state 08/24/2012   Basal cell carcinoma    Right Leg    Chronic left SI joint pain    WORK COMP    Constipation    due to pain meds    Depression    GERD (gastroesophageal reflux disease)    Headache, migraine    Hyperlipidemia    Hypertension    Kidney stones    Osteoarthritis    back   Pulmonary HTN ([place]) 12/21/2014   S/P cardiac cath 12/20/14 normal coronary arteries 12/21/2014   Seasonal allergies     Past Surgical History:  Procedure Laterality Date   CARDIAC CATHETERIZATION N/A 12/20/2014   Procedure: Left Heart Cath and Coronary Angiography;  Surgeon: Sherren Mocha, MD;  Location: [place] [facility];  Service: Cardiovascular;  Laterality: N/A;   COLONOSCOPY     HEMORRHOID SURGERY  1989   lumbar disckectomy  2006   LUMBAR FUSION  2018   POLYPECTOMY     TUBAL LIGATION  1990     Home Medications:  Prior to Admission medications   Medication Sig Start Date End Date Taking? Authorizing Provider  Ascorbic Acid (VITAMIN C) 1000 MG tablet Take 1,000 mg by mouth daily.   Yes [provider]  azelastine (OPTIVAR) 0.05 % ophthalmic solution  PLACE 1 DROP INTO EACH EYE ONCE DAILY AS NEEDED FOR ALLERGIES 08/27/20  Yes Stacks, Cletus Gash, MD  b complex vitamins capsule Take 1 capsule by mouth daily.   Yes [provider]  buPROPion (WELLBUTRIN XL) 300 MG 24 hr tablet Take 1 tablet (300 mg total) by mouth daily. Dx:  [name] 07/19/20  Yes Stacks, Cletus Gash, MD  chlorthalidone (HYGROTON) 25 MG tablet One tablet by mouth daily 07/19/20  Yes Stacks, Cletus Gash, MD  clobetasol cream (TEMOVATE) 0.05 % APPLY TO AFFECTED AREA TWICE A DAY 08/27/20  Yes Stacks, Cletus Gash, MD  cyclobenzaprine (FLEXERIL) 5 MG tablet Take 5-10 mg by mouth at  bedtime as needed. 04/20/20  Yes [provider]  diltiazem (CARDIZEM CD) 300 MG 24 hr capsule Take 1 capsule (300 mg total) by mouth daily. 07/19/20  Yes Stacks, Cletus Gash, MD  escitalopram (LEXAPRO) 10 MG tablet Take 1 tablet (10 mg total) by mouth daily. 07/19/20  Yes Claretta Fraise, MD  ibuprofen (ADVIL) 800 MG tablet Take 1 tablet (800 mg total) by mouth 3 (three) times daily. 08/23/20  Yes Pollina, Gwenyth Allegra, MD  lidocaine (XYLOCAINE) 2 % solution Use as directed 15 mLs in the mouth or throat as needed for mouth pain. 08/23/20  Yes Pollina, Gwenyth Allegra, MD  oxyCODONE-acetaminophen (PERCOCET) 5-325 MG tablet One each morning, two each afternoon and two at bedtime 06/23/20  Yes Stacks, Cletus Gash, MD  pantoprazole (PROTONIX) 40 MG tablet Take 1 tablet (40 mg total) by mouth 2 (two) times daily before a meal. Patient taking differently: Take 40 mg by mouth daily. 07/19/20  Yes Claretta Fraise, MD  Polyethylene Glycol 3350 (MIRALAX PO) 2-3 times a week 12/25/16  Yes [provider]  potassium chloride (KLOR-CON) 10 MEQ tablet Take 1 tablet (10 mEq total) by mouth daily. 07/19/20  Yes Stacks, Cletus Gash, MD  pregabalin (LYRICA) 200 MG capsule Take 2 capsules (400 mg total) by mouth at bedtime. 07/19/20  Yes Claretta Fraise, MD  Semaglutide,0.25 or 0.5MG /DOS, (OZEMPIC, 0.25 OR 0.5 MG/DOSE,) 2 MG/1.5ML SOPN Inject 0.5 mg into the skin once a week. 07/23/20  Yes Stacks, Cletus Gash, MD  tiZANidine (ZANAFLEX) 4 MG tablet Take 1 tablet (4 mg total) by mouth 3 (three) times daily. 07/19/20  Yes Claretta Fraise, MD  cyclobenzaprine (FLEXERIL) 10 MG tablet Take 1 tablet (10 mg total) by mouth 3 (three) times daily as needed for muscle spasms. Patient not taking: No sig reported 10/12/19   Claretta Fraise, MD  Magnesium 250 MG TABS Take by mouth. Patient not taking: No sig reported    [provider]  methocarbamol (ROBAXIN) 500 MG tablet Take 500 mg by mouth every 6 (six) hours as needed. Patient not  taking: No sig reported 04/20/20   [provider]  metoprolol succinate (TOPROL-XL) 50 MG 24 hr tablet Take 1 tablet (50 mg total) by mouth daily. For blood pressure control Patient not taking: No sig reported 07/19/20   Claretta Fraise, MD  oxyCODONE-acetaminophen (PERCOCET) 5-325 MG tablet One each morning, two each afternoon and two at bedtime Patient not taking: No sig reported 07/19/20   Claretta Fraise, MD  oxyCODONE-acetaminophen (PERCOCET) 5-325 MG tablet One each morning, two each afternoon and two at bedtime Patient not taking: No sig reported 08/27/20   Claretta Fraise, MD    Inpatient Medications: Scheduled Meds:  buPROPion  300 mg Oral Daily   chlorthalidone  25 mg Oral Daily   diltiazem  300 mg Oral Daily   escitalopram  10 mg Oral Daily  heparin  5,000 Units Subcutaneous Q8H   pantoprazole  40 mg Oral BID AC   pregabalin  400 mg Oral QHS   tiZANidine  4 mg Oral TID   Continuous Infusions:  PRN Meds: acetaminophen, ondansetron (ZOFRAN) IV, oxyCODONE-acetaminophen  Allergies:    Allergies  Allergen Reactions   Gabapentin Anxiety    Intense, alarming dreams   Amlodipine Swelling   Hydralazine    Hydrocodone     Norco   Morphine And Related Nausea And Vomiting   Promethazine     Burns through the IV   Tramadol Other (See Comments)    Upset stomach    Social History:   Social History   Socioeconomic History   Marital status: Married    Spouse name: Not on file   Number of children: Not on file   Years of education: Not on file   Highest education level: Not on file  Occupational History   Not on file  Tobacco Use   Smoking status: Former    Packs/day: 1.00    Years: 10.00    Pack years: 10.00    Types: Cigarettes    Quit date: 09/18/1991    Years since quitting: 28.9   Smokeless tobacco: Never  Vaping Use   Vaping Use: Never used  Substance and Sexual Activity   Alcohol use: No   Drug use: No    Comment: no IVDA   Sexual activity: Yes   Other Topics Concern   Not on file  Social History Narrative   Married, works as a Radiation protection practitioner.     Social Determinants of Health   Financial Resource Strain: Not on file  Food Insecurity: Not on file  Transportation Needs: Not on file  Physical Activity: Not on file  Stress: Not on file  Social Connections: Not on file  Intimate Partner Violence: Not on file    Family History:    Family History  Problem Relation Age of Onset   Cancer Mother    Heart disease Father    Colon cancer Paternal Aunt 35       father's half sister   Colon cancer Paternal Uncle 59       father's half brother   Esophageal cancer Brother    Colon cancer Paternal Uncle    Colon polyps Neg Hx    Rectal cancer Neg Hx    Stomach cancer Neg Hx      ROS:  Please see the history of present illness.   All other ROS reviewed and negative.     Physical Exam/Data:   Vitals:   08/28/20 2330 08/28/20 2356 08/28/20 2359 08/29/20 0359  BP: (!) 148/86  (!) 163/92 114/67  Pulse: 64  68 67  Resp: 14  20 20   Temp: 98.2 F (36.8 C)  98.5 F (36.9 C) 98.1 F (36.7 C)  TempSrc: Oral  Oral   SpO2: 95%  99% 97%  Weight:  78 kg    Height:  5\' 6"  (1.676 m)     No intake or output data in the 24 hours ending 08/29/20 0746 Last 3 Weights 08/28/2020 08/27/2020 08/23/2020  Weight (lbs) 172 lb 172 lb 9.6 oz 175 lb  Weight (kg) 78.019 kg 78.291 kg 79.379 kg     Body mass index is 27.76 kg/m.  General: Pleasant female appearing in no acute distress HEENT: normal Lymph: no adenopathy Neck: no JVD Endocrine:  No thryomegaly Vascular: No carotid bruits; FA pulses 2+ bilaterally without bruits  Cardiac:  normal S1, S2; RRR; no murmur.  Lungs:  clear to auscultation bilaterally, no wheezing, rhonchi or rales  Abd: soft, nontender, no hepatomegaly  Ext: no lower extremity edema Musculoskeletal:  No deformities, BUE and BLE strength normal and equal Skin: warm and dry  Neuro:  CNs 2-12  intact, no focal abnormalities noted Psych:  Normal affect   EKG:  The EKG was personally reviewed and demonstrates: NSR, HR 79 with slight TWI along the anterior leads which is similar to prior tracings from 2016.  Telemetry:  Telemetry was personally reviewed and demonstrates: NSR, HR in 70's to 80's. One strip labeled as VT but most consistent with sinus rhythm with IVCD.   Relevant CV Studies:  Cardiac Catheterization: 12/2014 1. Angiographically normal coronary arteries 2. Normal LV systolic function   Suspect noncardiac chest pain. Anticipate discharge home tomorrow am.  Echocardiogram: 10/2016 Study Conclusions   - Left ventricle: The cavity size was normal. Wall thickness was    increased in a pattern of mild LVH. Systolic function was    vigorous. The estimated ejection fraction was in the range of 65%    to 70%. Wall motion was normal; there were no regional wall    motion abnormalities. Doppler parameters are consistent with    abnormal left ventricular relaxation (grade 1 diastolic    dysfunction).  - Aortic valve: Mildly calcified annulus. Normal thickness    leaflets. Valve area (VTI): 3.11 cm^2. Valve area (Vmax): 2.67    cm^2. Valve area (Vmean): 2.43 cm^2.  - Technically adequate study.   Laboratory Data:  High Sensitivity Troponin:   Recent Labs  Lab 08/28/20 1707 08/28/20 1827 08/28/20 2026 08/28/20 2226  TROPONINIHS 3 3 3 3      Chemistry Recent Labs  Lab 08/28/20 1707  NA 135  K 3.1*  CL 98  CO2 28  GLUCOSE 90  BUN 23*  CREATININE 0.79  CALCIUM 9.3  GFRNONAA >60  ANIONGAP 9    Recent Labs  Lab 08/28/20 1827  PROT 6.9  ALBUMIN 4.2  AST 17  ALT 23  ALKPHOS 69  BILITOT 0.8   Hematology Recent Labs  Lab 08/28/20 1744  WBC 8.1  RBC 4.59  HGB 13.7  HCT 40.6  MCV 88.5  MCH 29.8  MCHC 33.7  RDW 13.1  PLT 276   BNPNo results for input(s): BNP, PROBNP in the last 168 hours.  DDimer No results for input(s): DDIMER in the last  168 hours.   Radiology/Studies:  DG Chest 2 View  Result Date: 08/28/2020 CLINICAL DATA:  Chest pain EXAM: CHEST - 2 VIEW COMPARISON:  02/18/2016 FINDINGS: The heart size and mediastinal contours are within normal limits. Aortic atherosclerosis. Both lungs are clear. The visualized skeletal structures are unremarkable. IMPRESSION: No active cardiopulmonary disease. Electronically Signed   By: Donavan Foil M.D.   On: 08/28/2020 18:16     Assessment and Plan:   1. Chest Pain with Atypical Features - Her chest pain occurred at rest and lasted for 1.5 hours and she denies any associated symptoms and was not associated with exertion. She did have normal cors by prior cardiac catheterization in 2016. - Her Hs Troponin values have been negative x4 and EKG shows slight TWI along the anterior leads which is similar to prior tracings.  - An ETT was ordered by the admitting team but the patient cannot walk for long distances given SI joint issues. Overall, her pain seems atypical for an ischemic etiology and is  less likely given normal cors by cath in 2016 and negative Hs Troponin values. Given her recent URI, will obtain an echocardiogram to rule-out an effusion but symptoms are not fully consistent with pleuritic pain either as they were not associated with positional changes or inspiration. If echocardiogram is reassuring, could likely follow-up as an outpatient and if she has recurrent symptoms in the interim would arrange for a Lexiscan Myoview at that time.   2. HTN - BP was initially elevated while in the ED but improved to 114/67 on most recent check.  - She has been continued on PTA Chlorthalidone 25mg  daily and Cardizem CD 300mg  daily.  3. HLD - FLP this admission shows total cholesterol of 208, triglycerides 99, HDL 52 and LDL 136. Would recommend adding Crestor 10mg  daily with repeat FLP and LFT's in 6-8 weeks.     Risk Assessment/Risk Scores:     HEAR Score (for undifferentiated chest  pain):  HEAR Score: 5   For questions or updates, please contact [place] Please consult [email or web address] for contact info under    Signed, Erma Heritage, PA-C  08/29/2020 7:46 AM   Attending note:  Patient seen and examined.  I reviewed her records and discussed the case with Lisa Parrish, I agree with her above findings.  Lisa Parrish presents with atypical chest discomfort, possibly recent viral URI based on symptoms.  At baseline no regular exertional chest discomfort or unusual breathlessness.  No recent cough, fevers or chills.  COVID-19 negative.  She was admitted for observation on the hospitalist service.  Previous cardiac evaluation includes documentation of normal coronaries in 2016.  On examination she appears comfortable, reports resolution of symptoms.  She is afebrile.  Heart rate in the 60s in sinus rhythm by telemetry which I personally reviewed.  Systolic blood pressure ranging 114-162.  Lungs are clear.  Cardiac exam with RRR no gallop.  Pertinent lab work includes creatinine 0.79, high-sensitivity troponin I levels normal x4, LDL 136, hemoglobin 13.7, platelets 276.  I personally reviewed her ECG which shows sinus rhythm with nonspecific ST-T changes and minimally increased voltage.  Chest x-ray shows no active cardiopulmonary process.  Echocardiogram has been ordered, if no evidence of cardiomyopathy, no further inpatient cardiac testing is indicated and she can likely be discharged later today.  We will arrange an outpatient follow-up visit.  She had previously followed with Dr. Bronson Ing.  Satira Sark, M.D., F.A.C.C.

## 2020-08-29 NOTE — Discharge Summary (Signed)
Physician Discharge Summary  Lisa Parrish TSV:[id number] DOB: 01-Nov-1960 DOA: 08/28/2020  PCP: Lisa Fraise, MD  Admit date: 08/28/2020  Discharge date: 08/29/2020  Admitted From:Home  Disposition:  Home  Recommendations for Outpatient Follow-up:  Follow up with PCP in 1-2 weeks Please follow-up with cardiology as scheduled on 8/3 at 2:30 PM Continue on other home medications as prior  Home Health: None  Equipment/Devices: None  Discharge Condition:Stable  CODE STATUS: Full  Diet recommendation: Heart Healthy  Brief/Interim Summary: Lisa Parrish  is a 60 y.o. female, with history of anxiety, chronic pain, GERD, hyperlipidemia, hypertension, depression who presents the ED with a chief complaint of chest pain.  Of note she had a cardiac cath in 2016.  At that time she was having burning chest pain and ended up with a diagnosis of GERD.  Today, she presented to the ED with atypical chest pain that lasted approximately 1-1/2 hours.  It was not associated with any exertion and she was evaluated by cardiology with recommendations for 2D echocardiogram.  Her troponins as well as EKG were noted to be within normal limits and she had no further chest pain during the course of her admission.  Her vital signs have remained stable.  Her 2D echocardiogram had returned later in the afternoon with no significant findings or concerns for ACS.  Her LVEF is 55-60% with no wall motion abnormalities.  She will have close follow-up on 8/3 with cardiology for further continued assessment.  She is in stable condition for discharge.  Discharge Diagnoses:  Active Problems:   HTN (hypertension)   Chronic pain   Hyperlipidemia   Gastroesophageal reflux disease without esophagitis   Chest pain   Hypokalemia  Principal discharge diagnosis: Atypical chest pain.  Discharge Instructions  Discharge Instructions     Diet - low sodium heart healthy   Complete by: As directed    Increase  activity slowly   Complete by: As directed       Allergies as of 08/29/2020       Reactions   Gabapentin Anxiety   Intense, alarming dreams   Amlodipine Swelling   Hydralazine    Hydrocodone    Norco   Morphine And Related Nausea And Vomiting   Promethazine    Burns through the IV   Tramadol Other (See Comments)   Upset stomach        Medication List     STOP taking these medications    Magnesium 250 MG Tabs   methocarbamol 500 MG tablet Commonly known as: ROBAXIN   metoprolol succinate 50 MG 24 hr tablet Commonly known as: TOPROL-XL       TAKE these medications    azelastine 0.05 % ophthalmic solution Commonly known as: OPTIVAR PLACE 1 DROP INTO EACH EYE ONCE DAILY AS NEEDED FOR ALLERGIES   b complex vitamins capsule Take 1 capsule by mouth daily.   buPROPion 300 MG 24 hr tablet Commonly known as: WELLBUTRIN XL Take 1 tablet (300 mg total) by mouth daily. Dx:  Lisa Parrish   chlorthalidone 25 MG tablet Commonly known as: HYGROTON One tablet by mouth daily   clobetasol cream 0.05 % Commonly known as: TEMOVATE APPLY TO AFFECTED AREA TWICE A DAY   cyclobenzaprine 5 MG tablet Commonly known as: FLEXERIL Take 5-10 mg by mouth at bedtime as needed. What changed: Another medication with the same name was removed. Continue taking this medication, and follow the directions you see here.   diltiazem 300 MG 24 hr capsule Commonly  known as: CARDIZEM CD Take 1 capsule (300 mg total) by mouth daily.   escitalopram 10 MG tablet Commonly known as: LEXAPRO Take 1 tablet (10 mg total) by mouth daily.   ibuprofen 800 MG tablet Commonly known as: ADVIL Take 1 tablet (800 mg total) by mouth 3 (three) times daily.   lidocaine 2 % solution Commonly known as: XYLOCAINE Use as directed 15 mLs in the mouth or throat as needed for mouth pain.   MIRALAX PO 2-3 times a week   oxyCODONE-acetaminophen 5-325 MG tablet Commonly known as: Percocet One each morning, two  each afternoon and two at bedtime What changed: Another medication with the same name was removed. Continue taking this medication, and follow the directions you see here.   Ozempic (0.25 or 0.5 MG/DOSE) 2 MG/1.5ML Sopn Generic drug: Semaglutide(0.25 or 0.5MG /DOS) Inject 0.5 mg into the skin once a week.   pantoprazole 40 MG tablet Commonly known as: PROTONIX Take 1 tablet (40 mg total) by mouth 2 (two) times daily before a meal. What changed: when to take this   potassium chloride 10 MEQ tablet Commonly known as: KLOR-CON Take 1 tablet (10 mEq total) by mouth daily.   pregabalin 200 MG capsule Commonly known as: LYRICA Take 2 capsules (400 mg total) by mouth at bedtime.   tiZANidine 4 MG tablet Commonly known as: ZANAFLEX Take 1 tablet (4 mg total) by mouth 3 (three) times daily.   vitamin C 1000 MG tablet Take 1,000 mg by mouth daily.        Follow-up Information     Erma Heritage, PA-C Follow up on 10/03/2020.   Specialties: Physician Assistant, Cardiology Why: [facility]iology [facility] Follow-up on 10/03/2020 at 2:30 PM. Contact information: [place] [address] [phone]         Lisa Fraise, MD. Schedule an appointment as soon as possible for a visit in 1 week(s).   Specialty: Family Medicine Contact information: [place] [address] [phone]                Allergies  Allergen Reactions   Gabapentin Anxiety    Intense, alarming dreams   Amlodipine Swelling   Hydralazine    Hydrocodone     Norco   Morphine And Related Nausea And Vomiting   Promethazine     Burns through the IV   Tramadol Other (See Comments)    Upset stomach    Consultations: Cardiology   Procedures/Studies: DG Chest 2 View  Result Date: 08/28/2020 CLINICAL DATA:  Chest pain EXAM: CHEST - 2 VIEW COMPARISON:  02/18/2016 FINDINGS: The heart size and mediastinal contours are within normal limits. Aortic atherosclerosis. Both lungs are  clear. The visualized skeletal structures are unremarkable. IMPRESSION: No active cardiopulmonary disease. Electronically Signed   By: Donavan Foil M.D.   On: 08/28/2020 18:16   ECHOCARDIOGRAM COMPLETE  Result Date: 08/29/2020    ECHOCARDIOGRAM REPORT   Patient Name:   Lisa Parrish Date of Exam: 08/29/2020 Medical Rec #:  [id number]           Height:       66.0 in Accession #:    [id number]          Weight:       172.0 lb Date of Birth:  1960/06/03            BSA:          1.876 m Patient Age:    38 years  BP:           114/67 mmHg Patient Gender: F                   HR:           67 bpm. Exam Location:  [facility] Procedure: 2D Echo, Cardiac Doppler and Color Doppler Indications:    Chest Pain  History:        Patient has prior history of Echocardiogram examinations, most                 recent 10/08/2016. Signs/Symptoms:Chest Pain; Risk                 Factors:Hypertension and Dyslipidemia.  Sonographer:    Wenda Low Referring Phys: [id number]  1. Left ventricular ejection fraction, by estimation, is 55 to 60%. The left ventricle has normal function. The left ventricle has no regional wall motion abnormalities. There is mild left ventricular hypertrophy. Left ventricular diastolic parameters were normal.  2. Right ventricular systolic function is normal. The right ventricular size is normal. There is normal pulmonary artery systolic pressure. The estimated right ventricular systolic pressure is 93.7 mmHg.  3. The mitral valve is grossly normal. Trivial mitral valve regurgitation.  4. The aortic valve is tricuspid. Aortic valve regurgitation is not visualized.  5. The inferior vena cava is normal in size with greater than 50% respiratory variability, suggesting right atrial pressure of 3 mmHg. FINDINGS  Left Ventricle: Left ventricular ejection fraction, by estimation, is 55 to 60%. The left ventricle has normal function. The left ventricle has no regional wall  motion abnormalities. The left ventricular internal cavity size was normal in size. There is  mild left ventricular hypertrophy. Left ventricular diastolic parameters were normal. Right Ventricle: The right ventricular size is normal. No increase in right ventricular wall thickness. Right ventricular systolic function is normal. There is normal pulmonary artery systolic pressure. The tricuspid regurgitant velocity is 2.42 m/s, and  with an assumed right atrial pressure of 3 mmHg, the estimated right ventricular systolic pressure is 16.9 mmHg. Left Atrium: Left atrial size was normal in size. Right Atrium: Right atrial size was normal in size. Pericardium: There is no evidence of pericardial effusion. Mitral Valve: The mitral valve is grossly normal. Mild mitral annular calcification. Trivial mitral valve regurgitation. MV peak gradient, 5.7 mmHg. The mean mitral valve gradient is 2.0 mmHg. Tricuspid Valve: The tricuspid valve is grossly normal. Tricuspid valve regurgitation is mild. Aortic Valve: The aortic valve is tricuspid. Aortic valve regurgitation is not visualized. Aortic valve mean gradient measures 5.0 mmHg. Aortic valve peak gradient measures 10.9 mmHg. Aortic valve area, by VTI measures 2.93 cm. Pulmonic Valve: The pulmonic valve was grossly normal. Pulmonic valve regurgitation is trivial. Aorta: The aortic root is normal in size and structure. Venous: The inferior vena cava is normal in size with greater than 50% respiratory variability, suggesting right atrial pressure of 3 mmHg. IAS/Shunts: No atrial level shunt detected by color flow Doppler.  LEFT VENTRICLE PLAX 2D LVIDd:         4.14 cm  Diastology LVIDs:         2.92 cm  LV e' medial:    9.90 cm/s LV PW:         1.14 cm  LV E/e' medial:  7.6 LV IVS:        1.21 cm  LV e' lateral:   9.99 cm/s LVOT diam:  2.10 cm  LV E/e' lateral: 7.6 LV SV:         94 LV SV Index:   50 LVOT Area:     3.46 cm  RIGHT VENTRICLE RV Basal diam:  3.52 cm RV Mid diam:     2.82 cm RV S prime:     15.70 cm/s TAPSE (M-mode): 3.1 cm LEFT ATRIUM             Index       RIGHT ATRIUM           Index LA diam:        4.20 cm 2.24 cm/m  RA Area:     17.10 cm LA Vol (A2C):   58.9 ml 31.40 ml/m RA Volume:   46.10 ml  24.58 ml/m LA Vol (A4C):   52.8 ml 28.15 ml/m LA Biplane Vol: 55.8 ml 29.75 ml/m  AORTIC VALVE AV Area (Vmax):    2.54 cm AV Area (Vmean):   2.85 cm AV Area (VTI):     2.93 cm AV Vmax:           165.00 cm/s AV Vmean:          106.000 cm/s AV VTI:            0.319 m AV Peak Grad:      10.9 mmHg AV Mean Grad:      5.0 mmHg LVOT Vmax:         121.00 cm/s LVOT Vmean:        87.200 cm/s LVOT VTI:          0.270 m LVOT/AV VTI ratio: 0.85  AORTA Ao Root diam: 3.10 cm Ao Asc diam:  3.00 cm MITRAL VALVE                TRICUSPID VALVE MV Area (PHT): 3.31 cm     TR Peak grad:   23.4 mmHg MV Area VTI:   3.05 cm     TR Vmax:        242.00 cm/s MV Peak grad:  5.7 mmHg MV Mean grad:  2.0 mmHg     SHUNTS MV Vmax:       1.19 m/s     Systemic VTI:  0.27 m MV Vmean:      56.5 cm/s    Systemic Diam: 2.10 cm MV Decel Time: 229 msec MV E velocity: 75.70 cm/s MV A velocity: 101.00 cm/s MV E/A ratio:  0.75 Rozann Lesches MD Electronically signed by Rozann Lesches MD Signature Date/Time: 08/29/2020/4:02:33 PM    Final      Discharge Exam: Vitals:   08/29/20 0359 08/29/20 1409  BP: 114/67 100/60  Pulse: 67 62  Resp: 20 18  Temp: 98.1 F (36.7 C) 97.8 F (36.6 C)  SpO2: 97% 98%   Vitals:   08/28/20 2356 08/28/20 2359 08/29/20 0359 08/29/20 1409  BP:  (!) 163/92 114/67 100/60  Pulse:  68 67 62  Resp:  20 20 18   Temp:  98.5 F (36.9 C) 98.1 F (36.7 C) 97.8 F (36.6 C)  TempSrc:  Oral  Oral  SpO2:  99% 97% 98%  Weight: 78 kg     Height: 5\' 6"  (1.676 m)       General: Pt is alert, awake, not in acute distress Cardiovascular: RRR, S1/S2 +, no rubs, no gallops Respiratory: CTA bilaterally, no wheezing, no rhonchi Abdominal: Soft, NT, ND, bowel sounds + Extremities:  no edema, no cyanosis    The  results of significant diagnostics from this hospitalization (including imaging, microbiology, ancillary and laboratory) are listed below for reference.     Microbiology: Recent Results (from the past 240 hour(s))  Group A Strep by PCR     Status: None   Collection Time: 08/23/20  3:12 AM   Specimen: Throat; Sterile Swab  Result Value Ref Range Status   Group A Strep by PCR NOT DETECTED NOT DETECTED Final    Comment: Performed at [facility], [address] [place], [place] [address]  Resp Panel by RT-PCR (Flu A&B, Covid) Nasopharyngeal Swab     Status: None   Collection Time: 08/23/20  3:12 AM   Specimen: Nasopharyngeal Swab; Nasopharyngeal(NP) swabs in vial transport medium  Result Value Ref Range Status   SARS Coronavirus 2 by RT PCR NEGATIVE NEGATIVE Final    Comment: (NOTE) SARS-CoV-2 target nucleic acids are NOT DETECTED.  The SARS-CoV-2 RNA is generally detectable in upper respiratory specimens during the acute phase of infection. The lowest concentration of SARS-CoV-2 viral copies this assay can detect is 138 copies/mL. A negative result does not preclude SARS-Cov-2 infection and should not be used as the sole basis for treatment or other patient management decisions. A negative result may occur with  improper specimen collection/handling, submission of specimen other than nasopharyngeal swab, presence of viral mutation(s) within the areas targeted by this assay, and inadequate number of viral copies(<138 copies/mL). A negative result must be combined with clinical observations, patient history, and epidemiological information. The expected result is Negative.  Fact Sheet for Patients:  EntrepreneurPulse.com.au  Fact Sheet for Healthcare Providers:  IncredibleEmployment.be  This test is no t yet approved or cleared by the [place] FDA and  has been authorized for detection and/or diagnosis of  SARS-CoV-2 by FDA under an Emergency Use Authorization (EUA). This EUA will remain  in effect (meaning this test can be used) for the duration of the COVID-19 declaration under Section 564(b)(1) of the Act, 21 U.S.C.section 360bbb-3(b)(1), unless the authorization is terminated  or revoked sooner.       Influenza A by PCR NEGATIVE NEGATIVE Final   Influenza B by PCR NEGATIVE NEGATIVE Final    Comment: (NOTE) The Xpert Xpress SARS-CoV-2/FLU/RSV plus assay is intended as an aid in the diagnosis of influenza from Nasopharyngeal swab specimens and should not be used as a sole basis for treatment. Nasal washings and aspirates are unacceptable for Xpert Xpress SARS-CoV-2/FLU/RSV testing.  Fact Sheet for Patients: EntrepreneurPulse.com.au  Fact Sheet for Healthcare Providers: IncredibleEmployment.be  This test is not yet approved or cleared by the [place] FDA and has been authorized for detection and/or diagnosis of SARS-CoV-2 by FDA under an Emergency Use Authorization (EUA). This EUA will remain in effect (meaning this test can be used) for the duration of the COVID-19 declaration under Section 564(b)(1) of the Act, 21 U.S.C. section 360bbb-3(b)(1), unless the authorization is terminated or revoked.  Performed at [facility], [address] [place], [place] [address]   Resp Panel by RT-PCR (Flu A&B, Covid) Nasopharyngeal Swab     Status: None   Collection Time: 08/28/20 11:37 PM   Specimen: Nasopharyngeal Swab; Nasopharyngeal(NP) swabs in vial transport medium  Result Value Ref Range Status   SARS Coronavirus 2 by RT PCR NEGATIVE NEGATIVE Final    Comment: (NOTE) SARS-CoV-2 target nucleic acids are NOT DETECTED.  The SARS-CoV-2 RNA is generally detectable in upper respiratory specimens during the acute phase of infection. The lowest concentration of SARS-CoV-2 viral copies  this assay can detect is 138 copies/mL. A negative result does  not preclude SARS-Cov-2 infection and should not be used as the sole basis for treatment or other patient management decisions. A negative result may occur with  improper specimen collection/handling, submission of specimen other than nasopharyngeal swab, presence of viral mutation(s) within the areas targeted by this assay, and inadequate number of viral copies(<138 copies/mL). A negative result must be combined with clinical observations, patient history, and epidemiological information. The expected result is Negative.  Fact Sheet for Patients:  EntrepreneurPulse.com.au  Fact Sheet for Healthcare Providers:  IncredibleEmployment.be  This test is no t yet approved or cleared by the [place] FDA and  has been authorized for detection and/or diagnosis of SARS-CoV-2 by FDA under an Emergency Use Authorization (EUA). This EUA will remain  in effect (meaning this test can be used) for the duration of the COVID-19 declaration under Section 564(b)(1) of the Act, 21 U.S.C.section 360bbb-3(b)(1), unless the authorization is terminated  or revoked sooner.       Influenza A by PCR NEGATIVE NEGATIVE Final   Influenza B by PCR NEGATIVE NEGATIVE Final    Comment: (NOTE) The Xpert Xpress SARS-CoV-2/FLU/RSV plus assay is intended as an aid in the diagnosis of influenza from Nasopharyngeal swab specimens and should not be used as a sole basis for treatment. Nasal washings and aspirates are unacceptable for Xpert Xpress SARS-CoV-2/FLU/RSV testing.  Fact Sheet for Patients: EntrepreneurPulse.com.au  Fact Sheet for Healthcare Providers: IncredibleEmployment.be  This test is not yet approved or cleared by the [place] FDA and has been authorized for detection and/or diagnosis of SARS-CoV-2 by FDA under an Emergency Use Authorization (EUA). This EUA will remain in effect (meaning this test can be used) for the  duration of the COVID-19 declaration under Section 564(b)(1) of the Act, 21 U.S.C. section 360bbb-3(b)(1), unless the authorization is terminated or revoked.  Performed at [facility], [address], [place], [place] [address]      Labs: BNP (last 3 results) No results for input(s): BNP in the last 8760 hours. Basic Metabolic Panel: Recent Labs  Lab 08/28/20 1707 08/28/20 1827  NA 135  --   K 3.1*  --   CL 98  --   CO2 28  --   GLUCOSE 90  --   BUN 23*  --   CREATININE 0.79  --   CALCIUM 9.3  --   MG  --  1.9   Liver Function Tests: Recent Labs  Lab 08/28/20 1827  AST 17  ALT 23  ALKPHOS 69  BILITOT 0.8  PROT 6.9  ALBUMIN 4.2   Recent Labs  Lab 08/28/20 1827  LIPASE 27   No results for input(s): AMMONIA in the last 168 hours. CBC: Recent Labs  Lab 08/28/20 1744  WBC 8.1  HGB 13.7  HCT 40.6  MCV 88.5  PLT 276   Cardiac Enzymes: No results for input(s): CKTOTAL, CKMB, CKMBINDEX, TROPONINI in the last 168 hours. BNP: Invalid input(s): POCBNP CBG: No results for input(s): GLUCAP in the last 168 hours. D-Dimer No results for input(s): DDIMER in the last 72 hours. Hgb A1c No results for input(s): HGBA1C in the last 72 hours. Lipid Profile Recent Labs    08/29/20 0543  CHOL 208*  HDL 52  LDLCALC 136*  TRIG 99  CHOLHDL 4.0   Thyroid function studies No results for input(s): TSH, T4TOTAL, T3FREE, THYROIDAB in the last 72 hours.  Invalid input(s): FREET3 Anemia work up No  results for input(s): VITAMINB12, FOLATE, FERRITIN, TIBC, IRON, RETICCTPCT in the last 72 hours. Urinalysis    Component Value Date/Time   APPEARANCEUR Clear 01/25/2020 1147   GLUCOSEU Negative 01/25/2020 1147   BILIRUBINUR Negative 01/25/2020 1147   PROTEINUR Negative 01/25/2020 1147   UROBILINOGEN negative 03/19/2015 1738   NITRITE Negative 01/25/2020 1147   LEUKOCYTESUR Negative 01/25/2020 1147   Sepsis Labs Invalid input(s): PROCALCITONIN,  WBC,   LACTICIDVEN Microbiology Recent Results (from the past 240 hour(s))  Group A Strep by PCR     Status: None   Collection Time: 08/23/20  3:12 AM   Specimen: Throat; Sterile Swab  Result Value Ref Range Status   Group A Strep by PCR NOT DETECTED NOT DETECTED Final    Comment: Performed at [facility], [address] [place], [place] [address]  Resp Panel by RT-PCR (Flu A&B, Covid) Nasopharyngeal Swab     Status: None   Collection Time: 08/23/20  3:12 AM   Specimen: Nasopharyngeal Swab; Nasopharyngeal(NP) swabs in vial transport medium  Result Value Ref Range Status   SARS Coronavirus 2 by RT PCR NEGATIVE NEGATIVE Final    Comment: (NOTE) SARS-CoV-2 target nucleic acids are NOT DETECTED.  The SARS-CoV-2 RNA is generally detectable in upper respiratory specimens during the acute phase of infection. The lowest concentration of SARS-CoV-2 viral copies this assay can detect is 138 copies/mL. A negative result does not preclude SARS-Cov-2 infection and should not be used as the sole basis for treatment or other patient management decisions. A negative result may occur with  improper specimen collection/handling, submission of specimen other than nasopharyngeal swab, presence of viral mutation(s) within the areas targeted by this assay, and inadequate number of viral copies(<138 copies/mL). A negative result must be combined with clinical observations, patient history, and epidemiological information. The expected result is Negative.  Fact Sheet for Patients:  EntrepreneurPulse.com.au  Fact Sheet for Healthcare Providers:  IncredibleEmployment.be  This test is no t yet approved or cleared by the [place] FDA and  has been authorized for detection and/or diagnosis of SARS-CoV-2 by FDA under an Emergency Use Authorization (EUA). This EUA will remain  in effect (meaning this test can be used) for the duration of the COVID-19 declaration under  Section 564(b)(1) of the Act, 21 U.S.C.section 360bbb-3(b)(1), unless the authorization is terminated  or revoked sooner.       Influenza A by PCR NEGATIVE NEGATIVE Final   Influenza B by PCR NEGATIVE NEGATIVE Final    Comment: (NOTE) The Xpert Xpress SARS-CoV-2/FLU/RSV plus assay is intended as an aid in the diagnosis of influenza from Nasopharyngeal swab specimens and should not be used as a sole basis for treatment. Nasal washings and aspirates are unacceptable for Xpert Xpress SARS-CoV-2/FLU/RSV testing.  Fact Sheet for Patients: EntrepreneurPulse.com.au  Fact Sheet for Healthcare Providers: IncredibleEmployment.be  This test is not yet approved or cleared by the [place] FDA and has been authorized for detection and/or diagnosis of SARS-CoV-2 by FDA under an Emergency Use Authorization (EUA). This EUA will remain in effect (meaning this test can be used) for the duration of the COVID-19 declaration under Section 564(b)(1) of the Act, 21 U.S.C. section 360bbb-3(b)(1), unless the authorization is terminated or revoked.  Performed at [facility], [address], [place], [place] [address]   Resp Panel by RT-PCR (Flu A&B, Covid) Nasopharyngeal Swab     Status: None   Collection Time: 08/28/20 11:37 PM   Specimen: Nasopharyngeal Swab; Nasopharyngeal(NP) swabs in vial transport medium  Result Value Ref Range Status   SARS Coronavirus 2 by RT PCR NEGATIVE NEGATIVE Final    Comment: (NOTE) SARS-CoV-2 target nucleic acids are NOT DETECTED.  The SARS-CoV-2 RNA is generally detectable in upper respiratory specimens during the acute phase of infection. The lowest concentration of SARS-CoV-2 viral copies this assay can detect is 138 copies/mL. A negative result does not preclude SARS-Cov-2 infection and should not be used as the sole basis for treatment or other patient management decisions. A negative result may occur with  improper  specimen collection/handling, submission of specimen other than nasopharyngeal swab, presence of viral mutation(s) within the areas targeted by this assay, and inadequate number of viral copies(<138 copies/mL). A negative result must be combined with clinical observations, patient history, and epidemiological information. The expected result is Negative.  Fact Sheet for Patients:  EntrepreneurPulse.com.au  Fact Sheet for Healthcare Providers:  IncredibleEmployment.be  This test is no t yet approved or cleared by the [place] FDA and  has been authorized for detection and/or diagnosis of SARS-CoV-2 by FDA under an Emergency Use Authorization (EUA). This EUA will remain  in effect (meaning this test can be used) for the duration of the COVID-19 declaration under Section 564(b)(1) of the Act, 21 U.S.C.section 360bbb-3(b)(1), unless the authorization is terminated  or revoked sooner.       Influenza A by PCR NEGATIVE NEGATIVE Final   Influenza B by PCR NEGATIVE NEGATIVE Final    Comment: (NOTE) The Xpert Xpress SARS-CoV-2/FLU/RSV plus assay is intended as an aid in the diagnosis of influenza from Nasopharyngeal swab specimens and should not be used as a sole basis for treatment. Nasal washings and aspirates are unacceptable for Xpert Xpress SARS-CoV-2/FLU/RSV testing.  Fact Sheet for Patients: EntrepreneurPulse.com.au  Fact Sheet for Healthcare Providers: IncredibleEmployment.be  This test is not yet approved or cleared by the [place] FDA and has been authorized for detection and/or diagnosis of SARS-CoV-2 by FDA under an Emergency Use Authorization (EUA). This EUA will remain in effect (meaning this test can be used) for the duration of the COVID-19 declaration under Section 564(b)(1) of the Act, 21 U.S.C. section 360bbb-3(b)(1), unless the authorization is terminated or revoked.  Performed at  [facility], [address], [place], [place] [address]      Time coordinating discharge: 35 minutes  SIGNED:   Rodena Goldmann, DO [facility] 08/29/2020, 4:39 PM  If 7PM-7AM, please contact night-coverage [email or web address]

## 2020-08-29 NOTE — H&P (Signed)
TRH H&P    Patient Demographics:    Lisa Parrish, is a 60 y.o. female  MRN: [id number]  DOB - 1961-01-17  Admit Date - 08/28/2020  Referring MD/NP/PA: Phylliss Bob  Outpatient Primary MD for the patient is Claretta Fraise, MD  Patient coming from: Home  Chief complaint- chest pain   HPI:    Lisa Parrish  is a 60 y.o. female, with history of anxiety, chronic pain, GERD, hyperlipidemia, hypertension, depression who presents the ED with a chief complaint of chest pain.  Of note she had a cardiac cath in 2016.  At that time she was having burning chest pain and ended up with a diagnosis of GERD.  Today she reports she was sitting down when she had sudden onset of pain was substernal.  Patient describes the pain by putting a fist rate between her breasts.  She says it was tight.  She tried moving, walking, rubbing the area, nothing made the pain better.  She reports the pain was constant, nothing made it worse.  She did not try any medications at home for it.  She reports a total duration of the pain was about 1 hour and 20 minutes.  She had no associated nausea, dizziness, changes in vision.  She does admit to anxiety and palpitations, but no sense of doom.  Patient reports that she was in the ED 6 days ago for a burning chest pain that did not feel like this.  6 days ago she was given lidocaine and ibuprofen and the pain went away.  She reports that she continues to take her PPI.  Patient has no other complaints at this time.  Patient does not smoke, does not drink alcohol, does not use illicit drugs.  She is vaccinated for COVID.  Patient is full code.  In the ED Temp 98.3, heart rate 66/79, respiratory rate 12-22, blood pressure 144/84, satting at 97% No leukocytosis, hemoglobin 13.7, chemistry panel reveals a hypokalemia Mag was 1.9, lipase 27, troponins were 3x3 Negative respiratory panel Chest x-ray shows  no active disease Admission requested for chest pain observation    Review of systems:    In addition to the HPI above,  No Fever-chills, No Headache, No changes with Vision or hearing, No problems swallowing food or Liquids, Admits to chest pain, no cough or Shortness of Breath, Admits to acid reflux, No Nausea or Vomiting, bowel movements are regular, No Blood in stool or Urine, No dysuria, No new skin rashes or bruises, No new joints pains-aches,  No new weakness, tingling, numbness in any extremity, No recent weight gain or loss, No polyuria, polydypsia or polyphagia, No significant Mental Stressors.  All other systems reviewed and are negative.    Past History of the following :    Past Medical History:  Diagnosis Date   Allergy    Anxiety    Anxiety state 08/24/2012   Basal cell carcinoma    Right Leg    Chronic left SI joint pain    WORK COMP    Constipation  due to pain meds    Depression    GERD (gastroesophageal reflux disease)    Headache, migraine    Hyperlipidemia    Hypertension    Kidney stones    Osteoarthritis    back   Pulmonary HTN ([place]) 12/21/2014   S/P cardiac cath 12/20/14 normal coronary arteries 12/21/2014   Seasonal allergies       Past Surgical History:  Procedure Laterality Date   CARDIAC CATHETERIZATION N/A 12/20/2014   Procedure: Left Heart Cath and Coronary Angiography;  Surgeon: Sherren Mocha, MD;  Location: [facility];  Service: Cardiovascular;  Laterality: N/A;   COLONOSCOPY     HEMORRHOID SURGERY  1989   lumbar disckectomy  2006   LUMBAR FUSION  2018   POLYPECTOMY     TUBAL LIGATION  1990      Social History:      Social History   Tobacco Use   Smoking status: Former    Packs/day: 1.00    Years: 10.00    Pack years: 10.00    Types: Cigarettes    Quit date: 09/18/1991    Years since quitting: 28.9   Smokeless tobacco: Never  Substance Use Topics   Alcohol use: No       Family History :      Family History  Problem Relation Age of Onset   Cancer Mother    Heart disease Father    Colon cancer Paternal Aunt 64       father's half sister   Colon cancer Paternal Uncle 55       father's half brother   Esophageal cancer Brother    Colon cancer Paternal Uncle    Colon polyps Neg Hx    Rectal cancer Neg Hx    Stomach cancer Neg Hx       Home Medications:   Prior to Admission medications   Medication Sig Start Date End Date Taking? Authorizing Provider  Ascorbic Acid (VITAMIN C) 1000 MG tablet Take 1,000 mg by mouth daily.   Yes [provider]  azelastine (OPTIVAR) 0.05 % ophthalmic solution PLACE 1 DROP INTO EACH EYE ONCE DAILY AS NEEDED FOR ALLERGIES 08/27/20  Yes Stacks, Cletus Gash, MD  b complex vitamins capsule Take 1 capsule by mouth daily.   Yes [provider]  buPROPion (WELLBUTRIN XL) 300 MG 24 hr tablet Take 1 tablet (300 mg total) by mouth daily. Dx:  [name] 07/19/20  Yes Stacks, Cletus Gash, MD  chlorthalidone (HYGROTON) 25 MG tablet One tablet by mouth daily 07/19/20  Yes Stacks, Cletus Gash, MD  clobetasol cream (TEMOVATE) 0.05 % APPLY TO AFFECTED AREA TWICE A DAY 08/27/20  Yes Stacks, Cletus Gash, MD  cyclobenzaprine (FLEXERIL) 5 MG tablet Take 5-10 mg by mouth at bedtime as needed. 04/20/20  Yes [provider]  diltiazem (CARDIZEM CD) 300 MG 24 hr capsule Take 1 capsule (300 mg total) by mouth daily. 07/19/20  Yes Stacks, Cletus Gash, MD  escitalopram (LEXAPRO) 10 MG tablet Take 1 tablet (10 mg total) by mouth daily. 07/19/20  Yes Claretta Fraise, MD  ibuprofen (ADVIL) 800 MG tablet Take 1 tablet (800 mg total) by mouth 3 (three) times daily. 08/23/20  Yes Pollina, Gwenyth Allegra, MD  lidocaine (XYLOCAINE) 2 % solution Use as directed 15 mLs in the mouth or throat as needed for mouth pain. 08/23/20  Yes Pollina, Gwenyth Allegra, MD  oxyCODONE-acetaminophen (PERCOCET) 5-325 MG tablet One each morning, two each afternoon and two at bedtime 06/23/20  Yes Claretta Fraise, MD  pantoprazole (PROTONIX) 40 MG tablet Take 1 tablet (40 mg total) by mouth 2 (two) times daily before a meal. Patient taking differently: Take 40 mg by mouth daily. 07/19/20  Yes Claretta Fraise, MD  Polyethylene Glycol 3350 (MIRALAX PO) 2-3 times a week 12/25/16  Yes [provider]  potassium chloride (KLOR-CON) 10 MEQ tablet Take 1 tablet (10 mEq total) by mouth daily. 07/19/20  Yes Stacks, Cletus Gash, MD  pregabalin (LYRICA) 200 MG capsule Take 2 capsules (400 mg total) by mouth at bedtime. 07/19/20  Yes Claretta Fraise, MD  Semaglutide,0.25 or 0.5MG /DOS, (OZEMPIC, 0.25 OR 0.5 MG/DOSE,) 2 MG/1.5ML SOPN Inject 0.5 mg into the skin once a week. 07/23/20  Yes Stacks, Cletus Gash, MD  tiZANidine (ZANAFLEX) 4 MG tablet Take 1 tablet (4 mg total) by mouth 3 (three) times daily. 07/19/20  Yes Claretta Fraise, MD  cyclobenzaprine (FLEXERIL) 10 MG tablet Take 1 tablet (10 mg total) by mouth 3 (three) times daily as needed for muscle spasms. Patient not taking: No sig reported 10/12/19   Claretta Fraise, MD  Magnesium 250 MG TABS Take by mouth. Patient not taking: No sig reported    [provider]  methocarbamol (ROBAXIN) 500 MG tablet Take 500 mg by mouth every 6 (six) hours as needed. Patient not taking: No sig reported 04/20/20   [provider]  metoprolol succinate (TOPROL-XL) 50 MG 24 hr tablet Take 1 tablet (50 mg total) by mouth daily. For blood pressure control Patient not taking: No sig reported 07/19/20   Claretta Fraise, MD  oxyCODONE-acetaminophen (PERCOCET) 5-325 MG tablet One each morning, two each afternoon and two at bedtime Patient not taking: No sig reported 07/19/20   Claretta Fraise, MD  oxyCODONE-acetaminophen (PERCOCET) 5-325 MG tablet One each morning, two each afternoon and two at bedtime Patient not taking: No sig reported 08/27/20   Claretta Fraise, MD     Allergies:     Allergies  Allergen Reactions   Gabapentin Anxiety    Intense, alarming dreams   Amlodipine  Swelling   Hydralazine    Hydrocodone     Norco   Morphine And Related Nausea And Vomiting   Promethazine     Burns through the IV   Tramadol Other (See Comments)    Upset stomach     Physical Exam:   Vitals  Blood pressure (!) 163/92, pulse 68, temperature 98.5 F (36.9 C), temperature source Oral, resp. rate 20, height 5\' 6"  (1.676 m), weight 78 kg, SpO2 99 %.  1.  General: Patient lying supine in bed,  no acute distress   2. Psychiatric: Alert and oriented x 3, mood and behavior normal for situation, pleasant and cooperative with exam   3. Neurologic: Speech and language are normal, face is symmetric, moves all 4 extremities voluntarily, at baseline without acute deficits on limited exam   4. HEENMT:  Head is atraumatic, normocephalic, pupils reactive to light, neck is supple, trachea is midline, mucous membranes are moist   5. Respiratory : Lungs are clear to auscultation bilaterally without wheezing, rhonchi, rales, no cyanosis, no increase in work of breathing or accessory muscle use   6. Cardiovascular : Heart rate normal, rhythm is regular, no murmurs, rubs or gallops, no peripheral edema, peripheral pulses palpated   7. Gastrointestinal:  Abdomen is soft, nondistended, nontender to palpation bowel sounds active, no masses or organomegaly palpated   8. Skin:  Skin is warm, dry and intact without rashes, acute lesions, or ulcers on limited exam  9.Musculoskeletal:  No acute deformities or trauma, no asymmetry in tone, no peripheral edema, peripheral pulses palpated, no tenderness to palpation in the extremities     Data Review:    CBC Recent Labs  Lab 08/28/20 1744  WBC 8.1  HGB 13.7  HCT 40.6  PLT 276  MCV 88.5  MCH 29.8  MCHC 33.7  RDW 13.1   ------------------------------------------------------------------------------------------------------------------  Results for orders placed or performed during the hospital encounter of 08/28/20 (from  the past 48 hour(s))  Basic metabolic panel     Status: Abnormal   Collection Time: 08/28/20  5:07 PM  Result Value Ref Range   Sodium 135 135 - 145 mmol/L   Potassium 3.1 (L) 3.5 - 5.1 mmol/L   Chloride 98 98 - 111 mmol/L   CO2 28 22 - 32 mmol/L   Glucose, Bld 90 70 - 99 mg/dL    Comment: Glucose reference range applies only to samples taken after fasting for at least 8 hours.   BUN 23 (H) 6 - 20 mg/dL   Creatinine, Ser 0.79 0.44 - 1.00 mg/dL   Calcium 9.3 8.9 - 10.3 mg/dL   GFR, Estimated >60 >60 mL/min    Comment: (NOTE) Calculated using the CKD-EPI Creatinine Equation (2021)    Anion gap 9 5 - 15    Comment: Performed at [facility], [address] [place], [place] [address]  Troponin I (High Sensitivity)     Status: None   Collection Time: 08/28/20  5:07 PM  Result Value Ref Range   Troponin I (High Sensitivity) 3 <18 ng/L    Comment: (NOTE) Elevated high sensitivity troponin I (hsTnI) values and significant  changes across serial measurements may suggest ACS but many other  chronic and acute conditions are known to elevate hsTnI results.  Refer to the "Links" section for chest pain algorithms and additional  guidance. Performed at [facility], [address], [place], [place] [address]   CBC     Status: None   Collection Time: 08/28/20  5:44 PM  Result Value Ref Range   WBC 8.1 4.0 - 10.5 K/uL   RBC 4.59 3.87 - 5.11 MIL/uL   Hemoglobin 13.7 12.0 - 15.0 g/dL   HCT 40.6 36.0 - 46.0 %   MCV 88.5 80.0 - 100.0 fL   MCH 29.8 26.0 - 34.0 pg   MCHC 33.7 30.0 - 36.0 g/dL   RDW 13.1 11.5 - 15.5 %   Platelets 276 150 - 400 K/uL   nRBC 0.0 0.0 - 0.2 %    Comment: Performed at [facility], [address] [place], [place] [address]  Hepatic function panel     Status: None   Collection Time: 08/28/20  6:27 PM  Result Value Ref Range   Total Protein 6.9 6.5 - 8.1 g/dL   Albumin 4.2 3.5 - 5.0 g/dL   AST 17 15 - 41 U/L   ALT 23 0 - 44 U/L   Alkaline Phosphatase 69 38 - 126  U/L   Total Bilirubin 0.8 0.3 - 1.2 mg/dL   Bilirubin, Direct 0.1 0.0 - 0.2 mg/dL   Indirect Bilirubin 0.7 0.3 - 0.9 mg/dL    Comment: Performed at [facility], [address], [place],  [address]  Lipase, blood     Status: None   Collection Time: 08/28/20  6:27 PM  Result Value Ref Range   Lipase 27 11 - 51 U/L    Comment: Performed at [facility], [address],  [place], [place] [address]  Magnesium     Status: None   Collection Time: 08/28/20  6:27 PM  Result Value Ref Range   Magnesium 1.9 1.7 - 2.4 mg/dL    Comment: Performed at [facility], [address], [place], [place] [address]  Troponin I (High Sensitivity)     Status: None   Collection Time: 08/28/20  6:27 PM  Result Value Ref Range   Troponin I (High Sensitivity) 3 <18 ng/L    Comment: (NOTE) Elevated high sensitivity troponin I (hsTnI) values and significant  changes across serial measurements may suggest ACS but many other  chronic and acute conditions are known to elevate hsTnI results.  Refer to the "Links" section for chest pain algorithms and additional  guidance. Performed at [facility], [address], [place], [place] [address]   Troponin I (High Sensitivity)     Status: None   Collection Time: 08/28/20  8:26 PM  Result Value Ref Range   Troponin I (High Sensitivity) 3 <18 ng/L    Comment: (NOTE) Elevated high sensitivity troponin I (hsTnI) values and significant  changes across serial measurements may suggest ACS but many other  chronic and acute conditions are known to elevate hsTnI results.  Refer to the "Links" section for chest pain algorithms and additional  guidance. Performed at [facility], [address] [place], [place] [address]   Troponin I (High Sensitivity)     Status: None   Collection Time: 08/28/20 10:26 PM  Result Value Ref Range   Troponin I (High Sensitivity) 3 <18 ng/L    Comment: (NOTE) Elevated high sensitivity troponin I (hsTnI) values and significant  changes  across serial measurements may suggest ACS but many other  chronic and acute conditions are known to elevate hsTnI results.  Refer to the "Links" section for chest pain algorithms and additional  guidance. Performed at [facility], [address], [place], [place] [address]   Resp Panel by RT-PCR (Flu A&B, Covid) Nasopharyngeal Swab     Status: None   Collection Time: 08/28/20 11:37 PM   Specimen: Nasopharyngeal Swab; Nasopharyngeal(NP) swabs in vial transport medium  Result Value Ref Range   SARS Coronavirus 2 by RT PCR NEGATIVE NEGATIVE    Comment: (NOTE) SARS-CoV-2 target nucleic acids are NOT DETECTED.  The SARS-CoV-2 RNA is generally detectable in upper respiratory specimens during the acute phase of infection. The lowest concentration of SARS-CoV-2 viral copies this assay can detect is 138 copies/mL. A negative result does not preclude SARS-Cov-2 infection and should not be used as the sole basis for treatment or other patient management decisions. A negative result may occur with  improper specimen collection/handling, submission of specimen other than nasopharyngeal swab, presence of viral mutation(s) within the areas targeted by this assay, and inadequate number of viral copies(<138 copies/mL). A negative result must be combined with clinical observations, patient history, and epidemiological information. The expected result is Negative.  Fact Sheet for Patients:  EntrepreneurPulse.com.au  Fact Sheet for Healthcare Providers:  IncredibleEmployment.be  This test is no t yet approved or cleared by the [place] FDA and  has been authorized for detection and/or diagnosis of SARS-CoV-2 by FDA under an Emergency Use Authorization (EUA). This EUA will remain  in effect (meaning this test can be used) for the duration of the COVID-19 declaration under Section 564(b)(1) of the Act, 21 U.S.C.section 360bbb-3(b)(1), unless the authorization  is terminated  or revoked sooner.       Influenza A by PCR  NEGATIVE NEGATIVE   Influenza B by PCR NEGATIVE NEGATIVE    Comment: (NOTE) The Xpert Xpress SARS-CoV-2/FLU/RSV plus assay is intended as an aid in the diagnosis of influenza from Nasopharyngeal swab specimens and should not be used as a sole basis for treatment. Nasal washings and aspirates are unacceptable for Xpert Xpress SARS-CoV-2/FLU/RSV testing.  Fact Sheet for Patients: EntrepreneurPulse.com.au  Fact Sheet for Healthcare Providers: IncredibleEmployment.be  This test is not yet approved or cleared by the [place] FDA and has been authorized for detection and/or diagnosis of SARS-CoV-2 by FDA under an Emergency Use Authorization (EUA). This EUA will remain in effect (meaning this test can be used) for the duration of the COVID-19 declaration under Section 564(b)(1) of the Act, 21 U.S.C. section 360bbb-3(b)(1), unless the authorization is terminated or revoked.  Performed at [facility], [address] [place], [place] [address]     Chemistries  Recent Labs  Lab 08/28/20 1707 08/28/20 1827  NA 135  --   K 3.1*  --   CL 98  --   CO2 28  --   GLUCOSE 90  --   BUN 23*  --   CREATININE 0.79  --   CALCIUM 9.3  --   MG  --  1.9  AST  --  17  ALT  --  23  ALKPHOS  --  69  BILITOT  --  0.8   ------------------------------------------------------------------------------------------------------------------  ------------------------------------------------------------------------------------------------------------------ GFR: Estimated Creatinine Clearance: 79.8 mL/min (by C-G formula based on SCr of 0.79 mg/dL). Liver Function Tests: Recent Labs  Lab 08/28/20 1827  AST 17  ALT 23  ALKPHOS 69  BILITOT 0.8  PROT 6.9  ALBUMIN 4.2   Recent Labs  Lab 08/28/20 1827  LIPASE 27   No results for input(s): AMMONIA in the last 168 hours. Coagulation Profile: No  results for input(s): INR, PROTIME in the last 168 hours. Cardiac Enzymes: No results for input(s): CKTOTAL, CKMB, CKMBINDEX, TROPONINI in the last 168 hours. BNP (last 3 results) No results for input(s): PROBNP in the last 8760 hours. HbA1C: No results for input(s): HGBA1C in the last 72 hours. CBG: No results for input(s): GLUCAP in the last 168 hours. Lipid Profile: No results for input(s): CHOL, HDL, LDLCALC, TRIG, CHOLHDL, LDLDIRECT in the last 72 hours. Thyroid Function Tests: No results for input(s): TSH, T4TOTAL, FREET4, T3FREE, THYROIDAB in the last 72 hours. Anemia Panel: No results for input(s): VITAMINB12, FOLATE, FERRITIN, TIBC, IRON, RETICCTPCT in the last 72 hours.  --------------------------------------------------------------------------------------------------------------- Urine analysis:    Component Value Date/Time   APPEARANCEUR Clear 01/25/2020 1147   GLUCOSEU Negative 01/25/2020 1147   BILIRUBINUR Negative 01/25/2020 1147   PROTEINUR Negative 01/25/2020 1147   UROBILINOGEN negative 03/19/2015 1738   NITRITE Negative 01/25/2020 1147   LEUKOCYTESUR Negative 01/25/2020 1147      Imaging Results:    DG Chest 2 View  Result Date: 08/28/2020 CLINICAL DATA:  Chest pain EXAM: CHEST - 2 VIEW COMPARISON:  02/18/2016 FINDINGS: The heart size and mediastinal contours are within normal limits. Aortic atherosclerosis. Both lungs are clear. The visualized skeletal structures are unremarkable. IMPRESSION: No active cardiopulmonary disease. Electronically Signed   By: Donavan Foil M.D.   On: 08/28/2020 18:16    My personal review of EKG: Rhythm NSR, Rate 79/min, QTc 392 ,no Acute ST changes   Assessment & Plan:    Active Problems:   HTN (hypertension)   Chronic pain   Hyperlipidemia   Gastroesophageal reflux disease without esophagitis  Chest pain   Hypokalemia   Chest pain Atypical chest pain Troponin normal x3 EKG is without ischemic changes Treadmill  stress test ordered for the a.m. Lipid panel in the a.m. EKG as needed chest pain Monitor on telemetry Hypokalemia Replace and recheck Check mag in the a.m. Hypertension Continue chlorthalidone and diltiazem GERD Continue PPI Chronic pain Continue Zanaflex, Lyrica, Percocet    DVT Prophylaxis-   Heparin- SCDs   AM Labs Ordered, also please review Full Orders  Family Communication: No family at bedside Code Status: Full  Admission status: Observation Time spent in minutes : [place] DO

## 2020-08-30 ENCOUNTER — Telehealth: Payer: Self-pay

## 2020-08-30 NOTE — Telephone Encounter (Signed)
Transition Care Management Follow-up Telephone Call Date of discharge and from where: [facility] 08/29/20 Diagnosis: atypical chest pain How have you been since you were released from the hospital? Better, no pain or issues since being hom Any questions or concerns? No  Items Reviewed: Did the pt receive and understand the discharge instructions provided? Yes  Medications obtained and verified? Yes  Other? No  Any new allergies since your discharge? No  Dietary orders reviewed? Yes Do you have support at home? Yes   Home Care and Equipment/Supplies: Were home health services ordered? No  Were any new equipment or medical supplies ordered?  No  Functional Questionnaire: (I = Independent and D = Dependent) ADLs: I  Bathing/Dressing- I  Meal Prep- I  Eating- I  Maintaining continence- I  Transferring/Ambulation- I  Managing Meds- I  Follow up appointments reviewed:  PCP Hospital f/u appt confirmed? Yes  Scheduled to see Hendricks Limes on 09/06/20 @ 3:05. [facility] f/u appt confirmed? Yes  Scheduled to see cardiology on 10/03/20 @ 2:30. Are transportation arrangements needed? No  If their condition worsens, is the pt aware to call PCP or go to the Emergency Dept.? Yes Was the patient provided with contact information for the PCP's office or ED? Yes Was to pt encouraged to call back with questions or concerns? Yes

## 2020-09-04 LAB — TOXASSURE SELECT 13 (MW), URINE

## 2020-09-06 ENCOUNTER — Ambulatory Visit: Payer: BC Managed Care – PPO | Admitting: Family Medicine

## 2020-09-06 ENCOUNTER — Encounter: Payer: Self-pay | Admitting: Family Medicine

## 2020-09-06 ENCOUNTER — Other Ambulatory Visit: Payer: Self-pay

## 2020-09-06 VITALS — BP 139/90 | HR 89 | Temp 97.9°F | Ht 66.0 in | Wt 171.4 lb

## 2020-09-06 DIAGNOSIS — R1319 Other dysphagia: Secondary | ICD-10-CM | POA: Diagnosis not present

## 2020-09-06 DIAGNOSIS — K219 Gastro-esophageal reflux disease without esophagitis: Secondary | ICD-10-CM

## 2020-09-06 MED ORDER — FAMOTIDINE 20 MG PO TABS: 20.0000 mg | ORAL_TABLET | Freq: Two times a day (BID) | ORAL | 2 refills | Status: DC

## 2020-09-06 NOTE — Progress Notes (Signed)
Assessment & Plan:  1. Gastroesophageal reflux disease without esophagitis Uncontrolled. Continue Protonix. Start famotidine. Education provided on GERD. - famotidine (PEPCID) 20 MG tablet; Take 1 tablet (20 mg total) by mouth 2 (two) times daily.  Dispense: 60 tablet; Refill: 2 - Ambulatory referral to Gastroenterology  2. Esophageal dysphagia - Ambulatory referral to Gastroenterology   Hendricks Limes, MSN, APRN, FNP-C [facility]  Subjective:   Patient ID: Lisa Parrish, female    DOB: 11-Jan-1961, 60 y.o.   MRN: [id number]  HPI: Lisa Parrish is a 60 y.o. female presenting on 09/06/2020 for Transitions Of Care (AP 08/29/20- chest pain )  Patient was admitted to [facility] 08/28/2020-08/29/2020 due to chest pain. Workup was normal. She has an appointment scheduled with cardiology on 10/03/2020. She reports today the pain prior to going to the ED occurred while she was watching television after working all day. It lasted 30-45 minutes. She did take pepto-bismol at that time, which was not helpful. She has had one episode of pain since she was in the hospital a few days ago that lasted for 15 minutes. She had just eaten spaghetti for lunch. Denies dizziness, shortness of breath, feeling like she is going to pass out, or radiation of pain. The pain is occurring in her epigastric region. She does have GERD and takes Protonix 40 mg twice daily. She has recently been having increased burping, reflux, and food getting stuck in her throat. She has never seen a gastroenterologist. She was prescribed the Protonix in 2016 when she was having chest pain.    ROS: Negative unless specifically indicated above in HPI.   Relevant past medical history reviewed and updated as indicated.   Allergies and medications reviewed and updated.   Current Outpatient Medications:    Ascorbic Acid (VITAMIN C) 1000 MG tablet, Take 1,000 mg by mouth daily., Disp: , Rfl:    azelastine  (OPTIVAR) 0.05 % ophthalmic solution, PLACE 1 DROP INTO EACH EYE ONCE DAILY AS NEEDED FOR ALLERGIES, Disp: 18 mL, Rfl: 1   b complex vitamins capsule, Take 1 capsule by mouth daily., Disp: , Rfl:    buPROPion (WELLBUTRIN XL) 300 MG 24 hr tablet, Take 1 tablet (300 mg total) by mouth daily. 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, Disp: 90 tablet, Rfl: 1   chlorthalidone (HYGROTON) 25 MG tablet, One tablet by mouth daily, Disp: 90 tablet, Rfl: 1   clobetasol cream (TEMOVATE) 0.05 %, APPLY TO AFFECTED AREA TWICE A DAY, Disp: 60 g, Rfl: 3   cyclobenzaprine (FLEXERIL) 5 MG tablet, Take 5-10 mg by mouth at bedtime as needed., Disp: , Rfl:    diltiazem (CARDIZEM CD) 300 MG 24 hr capsule, Take 1 capsule (300 mg total) by mouth daily., Disp: 90 capsule, Rfl: 1   escitalopram (LEXAPRO) 10 MG tablet, Take 1 tablet (10 mg total) by mouth daily., Disp: 90 tablet, Rfl: 1   ibuprofen (ADVIL) 800 MG tablet, Take 1 tablet (800 mg total) by mouth 3 (three) times daily., Disp: 21 tablet, Rfl: 0   lidocaine (XYLOCAINE) 2 % solution, Use as directed 15 mLs in the mouth or throat as needed for mouth pain., Disp: 200 mL, Rfl: 0   oxyCODONE-acetaminophen (PERCOCET) 5-325 MG tablet, One each morning, two each afternoon and two at bedtime, Disp: 150 tablet, Rfl: 0   pantoprazole (PROTONIX) 40 MG tablet, Take 1 tablet (40 mg total) by mouth 2 (two) times daily before a meal., Disp: 180 tablet, Rfl: 1   Polyethylene Glycol 3350 (MIRALAX  PO), 2-3 times a week, Disp: , Rfl:    potassium chloride (KLOR-CON) 10 MEQ tablet, Take 1 tablet (10 mEq total) by mouth daily., Disp: 90 tablet, Rfl: 1   pregabalin (LYRICA) 200 MG capsule, Take 2 capsules (400 mg total) by mouth at bedtime., Disp: 180 capsule, Rfl: 1   Semaglutide,0.25 or 0.5MG /DOS, (OZEMPIC, 0.25 OR 0.5 MG/DOSE,) 2 MG/1.5ML SOPN, Inject 0.5 mg into the skin once a week., Disp: 6 mL, Rfl: 0   tiZANidine (ZANAFLEX) 4 MG tablet, Take 1 tablet (4 mg total) by mouth 3 (three) times daily., Disp: 90 tablet,  Rfl: 1  Allergies  Allergen Reactions   Gabapentin Anxiety    Intense, alarming dreams   Amlodipine Swelling   Hydralazine    Hydrocodone     Norco   Morphine And Related Nausea And Vomiting   Promethazine     Burns through the IV   Tramadol Other (See Comments)    Upset stomach    Objective:   BP 139/90   Pulse 89   Temp 97.9 F (36.6 C) (Temporal)   Ht 5\' 6"  (1.676 m)   Wt 171 lb 6.4 oz (77.7 kg)   SpO2 97%   BMI 27.66 kg/m    Physical Exam Vitals reviewed.  Constitutional:      General: She is not in acute distress.    Appearance: Normal appearance. She is not ill-appearing, toxic-appearing or diaphoretic.  HENT:     Head: Normocephalic and atraumatic.  Eyes:     General: No scleral icterus.       Right eye: No discharge.        Left eye: No discharge.     Conjunctiva/sclera: Conjunctivae normal.  Cardiovascular:     Rate and Rhythm: Normal rate.  Pulmonary:     Effort: Pulmonary effort is normal. No respiratory distress.  Musculoskeletal:        General: Normal range of motion.     Cervical back: Normal range of motion.  Skin:    General: Skin is warm and dry.     Capillary Refill: Capillary refill takes less than 2 seconds.  Neurological:     General: No focal deficit present.     Mental Status: She is alert and oriented to person, place, and time. Mental status is at baseline.  Psychiatric:        Mood and Affect: Mood normal.        Behavior: Behavior normal.        Thought Content: Thought content normal.        Judgment: Judgment normal.

## 2020-09-26 ENCOUNTER — Ambulatory Visit: Payer: BC Managed Care – PPO | Admitting: Family Medicine

## 2020-09-27 ENCOUNTER — Encounter: Payer: Self-pay | Admitting: Family Medicine

## 2020-10-03 ENCOUNTER — Encounter: Payer: Self-pay | Admitting: Family Medicine

## 2020-10-03 ENCOUNTER — Ambulatory Visit: Payer: BC Managed Care – PPO | Admitting: Family Medicine

## 2020-10-03 ENCOUNTER — Ambulatory Visit: Payer: BC Managed Care – PPO | Admitting: Student

## 2020-10-03 ENCOUNTER — Other Ambulatory Visit: Payer: Self-pay

## 2020-10-03 VITALS — BP 152/95 | HR 80 | Temp 98.1°F | Ht 66.0 in | Wt 173.4 lb

## 2020-10-03 DIAGNOSIS — F1120 Opioid dependence, uncomplicated: Secondary | ICD-10-CM | POA: Diagnosis not present

## 2020-10-03 DIAGNOSIS — M5416 Radiculopathy, lumbar region: Secondary | ICD-10-CM

## 2020-10-03 MED ORDER — PREGABALIN 200 MG PO CAPS: 400.0000 mg | ORAL_CAPSULE | Freq: Every day | ORAL | 1 refills | Status: DC

## 2020-10-03 MED ORDER — OZEMPIC (0.25 OR 0.5 MG/DOSE) 2 MG/1.5ML ~~LOC~~ SOPN: 0.5000 mg | PEN_INJECTOR | SUBCUTANEOUS | 0 refills | Status: DC

## 2020-10-03 MED ORDER — OXYCODONE-ACETAMINOPHEN 5-325 MG PO TABS: ORAL_TABLET | ORAL | 0 refills | Status: DC

## 2020-10-03 NOTE — Progress Notes (Signed)
Subjective:  Patient ID: Lisa Parrish, female    DOB: 1960/12/21  Age: 60 y.o. MRN: [id number]  CC: Pain Management   HPI Lisa Parrish presents for severe low back pain. No sleep. Missed work 2 days ago.  Out of med for a week.  Pain has been horrible.  8-10/10 in the lower back.  PDMP shows a narcotic score of 621.  Sedative score is 411 overall overdose risk score is 210.  Recent refills are appropriate.  The last tramadol prescription filled elsewhere was April 26.  Depression screen Lisa Parrish  Decreased Interest 0 0 0  Down, Depressed, Hopeless 0 0 0  PHQ - 2 Score 0 0 0  Altered sleeping - 0 -  Tired, decreased energy - 0 -  Change in appetite - 0 -  Feeling bad or failure about yourself  - 0 -  Trouble concentrating - 0 -  Moving slowly or fidgety/restless - 0 -  Suicidal thoughts - 0 -  PHQ-9 Score - 0 -  Difficult doing work/chores - Not difficult at all -  Some recent data might be hidden    History Lisa Parrish has a past medical history of Allergy, Anxiety, Anxiety state (08/24/2012), Basal cell carcinoma, Chronic left SI joint pain, Constipation, Depression, GERD (gastroesophageal reflux disease), Headache, migraine, Hyperlipidemia, Hypertension, Kidney stones, Osteoarthritis, Pulmonary HTN ([place]) (12/21/2014), S/P cardiac cath 12/20/14 normal coronary arteries (12/21/2014), and Seasonal allergies.   She has a past surgical history that includes Hemorrhoid surgery (1989); Tubal ligation (1990); lumbar disckectomy (2006); Cardiac catheterization (N/A, 12/20/2014); Colonoscopy; Polypectomy; and Lumbar fusion (2018).   Her family history includes Cancer in her mother; Colon cancer in her paternal uncle; Colon cancer (age of onset: 90) in her paternal aunt and paternal uncle; Esophageal cancer in her brother; Heart disease in her father.She reports that she quit smoking about 29 years ago. Her smoking use included cigarettes. She has a 10.00  pack-year smoking history. She has never used smokeless tobacco. She reports that she does not drink alcohol and does not use drugs.    ROS Review of Systems  Constitutional: Negative.   HENT: Negative.    Eyes:  Negative for visual disturbance.  Respiratory:  Negative for shortness of breath.   Cardiovascular:  Negative for chest pain.  Gastrointestinal:  Negative for abdominal pain.  Musculoskeletal:  Positive for arthralgias and back pain.   Objective:  BP (!) 152/95   Pulse 80   Temp 98.1 F (36.7 C)   Ht '5\' 6"'$  (1.676 m)   Wt 173 lb 6.4 oz (78.7 kg)   SpO2 98%   BMI 27.99 kg/m   BP Readings from Last 3 Encounters:  10/03/20 (!) 152/95  09/06/20 139/90  08/29/20 100/60    Wt Readings from Last 3 Encounters:  10/03/20 173 lb 6.4 oz (78.7 kg)  09/06/20 171 lb 6.4 oz (77.7 kg)  08/28/20 172 lb (78 kg)     Physical Exam Constitutional:      General: She is not in acute distress.    Appearance: She is well-developed.  HENT:     Head: Normocephalic and atraumatic.  Eyes:     Conjunctiva/sclera: Conjunctivae normal.     Pupils: Pupils are equal, round, and reactive to light.  Cardiovascular:     Rate and Rhythm: Normal rate and regular rhythm.     Heart sounds: Normal heart sounds. No murmur heard. Pulmonary:     Effort: Pulmonary effort is normal. No respiratory  distress.     Breath sounds: Normal breath sounds. No wheezing or rales.  Abdominal:     General: Bowel sounds are normal. There is no distension.     Palpations: Abdomen is soft.     Tenderness: There is no abdominal tenderness.  Musculoskeletal:        General: Tenderness present.     Cervical back: Normal range of motion and neck supple.     Lumbar back: Spasms and tenderness present. No deformity. Decreased range of motion.  Skin:    General: Skin is warm and dry.  Neurological:     Mental Status: She is alert and oriented to person, place, and time.     Deep Tendon Reflexes: Reflexes are  normal and symmetric.  Psychiatric:        Behavior: Behavior normal.        Thought Content: Thought content normal.      Assessment & Plan:   Lisa Parrish was seen today for pain management.  Diagnoses and all orders for this visit:  Opiate dependence, continuous ([place]) -     ToxASSURE Select 13 (MW), Urine  Lumbar radiculopathy -     oxyCODONE-acetaminophen (PERCOCET) 5-325 MG tablet; One each morning, two each afternoon and two at bedtime -     ToxASSURE Select 13 (MW), Urine  Other orders -     pregabalin (LYRICA) 200 MG capsule; Take 2 capsules (400 mg total) by mouth at bedtime. -     Semaglutide,0.25 or 0.'5MG'$ /DOS, (OZEMPIC, 0.25 OR 0.5 MG/DOSE,) 2 MG/1.5ML SOPN; Inject 0.5 mg into the skin once a week.      I am having Lisa Parrish maintain her vitamin C, b complex vitamins, Polyethylene Glycol 3350 (MIRALAX PO), cyclobenzaprine, buPROPion, chlorthalidone, diltiazem, escitalopram, pantoprazole, potassium chloride, tiZANidine, lidocaine, ibuprofen, azelastine, clobetasol cream, famotidine, oxyCODONE-acetaminophen, pregabalin, and Ozempic (0.25 or 0.5 MG/DOSE).  Allergies as of 10/03/2020       Reactions   Gabapentin Anxiety   Intense, alarming dreams   Amlodipine Swelling   Hydralazine    Hydrocodone    Norco   Morphine And Related Nausea And Vomiting   Promethazine    Burns through the IV   Tramadol Other (See Comments)   Upset stomach        Medication List        Accurate as of October 03, 2020 11:59 PM. If you have any questions, ask your nurse or doctor.          azelastine 0.05 % ophthalmic solution Commonly known as: OPTIVAR PLACE 1 DROP INTO EACH EYE ONCE DAILY AS NEEDED FOR ALLERGIES   b complex vitamins capsule Take 1 capsule by mouth daily.   buPROPion 300 MG 24 hr tablet Commonly known as: WELLBUTRIN XL Take 1 tablet (300 mg total) by mouth daily. Dx:  Lisa Parrish   chlorthalidone 25 MG tablet Commonly known as: HYGROTON One tablet by  mouth daily   clobetasol cream 0.05 % Commonly known as: TEMOVATE APPLY TO AFFECTED AREA TWICE A DAY   cyclobenzaprine 5 MG tablet Commonly known as: FLEXERIL Take 5-10 mg by mouth at bedtime as needed.   diltiazem 300 MG 24 hr capsule Commonly known as: CARDIZEM CD Take 1 capsule (300 mg total) by mouth daily.   escitalopram 10 MG tablet Commonly known as: LEXAPRO Take 1 tablet (10 mg total) by mouth daily.   famotidine 20 MG tablet Commonly known as: PEPCID Take 1 tablet (20 mg total) by mouth 2 (two) times daily.  ibuprofen 800 MG tablet Commonly known as: ADVIL Take 1 tablet (800 mg total) by mouth 3 (three) times daily.   lidocaine 2 % solution Commonly known as: XYLOCAINE Use as directed 15 mLs in the mouth or throat as needed for mouth pain.   MIRALAX PO 2-3 times a week   oxyCODONE-acetaminophen 5-325 MG tablet Commonly known as: Percocet One each morning, two each afternoon and two at bedtime   Ozempic (0.25 or 0.5 MG/DOSE) 2 MG/1.5ML Sopn Generic drug: Semaglutide(0.25 or 0.'5MG'$ /DOS) Inject 0.5 mg into the skin once a week.   pantoprazole 40 MG tablet Commonly known as: PROTONIX Take 1 tablet (40 mg total) by mouth 2 (two) times daily before a meal.   potassium chloride 10 MEQ tablet Commonly known as: KLOR-CON Take 1 tablet (10 mEq total) by mouth daily.   pregabalin 200 MG capsule Commonly known as: LYRICA Take 2 capsules (400 mg total) by mouth at bedtime.   tiZANidine 4 MG tablet Commonly known as: ZANAFLEX Take 1 tablet (4 mg total) by mouth 3 (three) times daily.   vitamin C 1000 MG tablet Take 1,000 mg by mouth daily.         Follow-up: Return in about 1 month (around 11/03/2020).  Claretta Fraise, M.D.

## 2020-10-05 ENCOUNTER — Encounter: Payer: Self-pay | Admitting: Family Medicine

## 2020-10-05 LAB — TOXASSURE SELECT 13 (MW), URINE

## 2020-10-29 ENCOUNTER — Ambulatory Visit: Payer: BC Managed Care – PPO | Admitting: Family Medicine

## 2020-10-29 ENCOUNTER — Other Ambulatory Visit: Payer: Self-pay

## 2020-10-29 ENCOUNTER — Encounter: Payer: Self-pay | Admitting: Family Medicine

## 2020-10-29 ENCOUNTER — Other Ambulatory Visit: Payer: Self-pay | Admitting: Family Medicine

## 2020-10-29 VITALS — BP 132/80 | HR 76 | Temp 98.0°F | Resp 20 | Ht 66.0 in | Wt 172.0 lb

## 2020-10-29 DIAGNOSIS — M5416 Radiculopathy, lumbar region: Secondary | ICD-10-CM | POA: Diagnosis not present

## 2020-10-29 DIAGNOSIS — F1120 Opioid dependence, uncomplicated: Secondary | ICD-10-CM | POA: Diagnosis not present

## 2020-10-29 MED ORDER — OXYCODONE-ACETAMINOPHEN 5-325 MG PO TABS: ORAL_TABLET | ORAL | 0 refills | Status: DC

## 2020-10-29 NOTE — Progress Notes (Signed)
Subjective:  Patient ID: Lisa Parrish, female    DOB: January 01, 1961, 60 y.o.   MRN: [id number]  Patient Care Team: Claretta Fraise, MD as PCP - General (Family Medicine) Herminio Commons, MD (Inactive) as PCP - Cardiology (Cardiology)   Chief Complaint:  No chief complaint on file.   HPI: Lisa Parrish is a 60 y.o. female presenting on 10/29/2020 for No chief complaint on file.   Pt presents today for refill of Oxycodone for chronic back pain, her PCP is currently out of the office, so will provide 1 month supply until pt is able to see PCP. She has been taking medications as prescribed. Mild constipation at times from the medications, stool softeners and fiber helps.   Pain assessment: Cause of pain- lumbar radiculopathy, s/p spinal fusion at L4-5, S1 Pain location- lumbar spine Pain on scale of 1-10- 6/10 Frequency- daily What increases pain-activity, certain movements, lifting, bending What makes pain Better-rest, medications  Effects on ADL - moderate Any change in general medical condition-no  Current opioids rx- Oxycodone-Acetaminophen 5-325 mg # meds rx- 150 Effectiveness of current meds-good Adverse reactions from pain meds-mild constipation Morphine equivalent- 37.5 MeEg/Day  Pill count performed-No Last drug screen - 10/2020 ( high risk q16m moderate risk q613mlow risk yearly ) Urine drug screen today- No [facility] [place] [facility] [place]  If yes were their any concerning findings? - no   Overdose risk: low Opioid Risk  10/29/2020  Alcohol 0  Illegal Drugs 0  Rx Drugs 0  Alcohol 0  Illegal Drugs 0  Rx Drugs 0  Age between 16-45 years  0  History of Preadolescent Sexual Abuse 0  Psychological Disease 0  ADD Negative  OCD Negative  Bipolar Negative  Depression 1  Opioid Risk Tool Scoring 1  Opioid Risk Interpretation Low Risk     Pain contract signed on:    Relevant past medical, surgical, family, and social history reviewed and updated  as indicated.  Allergies and medications reviewed and updated. Data reviewed: Chart in Epic.   Past Medical History:  Diagnosis Date   Allergy    Anxiety    Anxiety state 08/24/2012   Basal cell carcinoma    Right Leg    Chronic left SI joint pain    WORK COMP    Constipation    due to pain meds    Depression    GERD (gastroesophageal reflux disease)    Headache, migraine    Hyperlipidemia    Hypertension    Kidney stones    Osteoarthritis    back   [place]   S/P cardiac cath 12/20/14 normal coronary arteries 12/21/2014   Seasonal allergies     Past Surgical History:  Procedure Laterality Date   CARDIAC CATHETERIZATION N/A 12/20/2014   Procedure: Left Heart Cath and Coronary Angiography;  Surgeon: MiSherren MochaMD;  [facility];  Service: Cardiovascular;  Laterality: N/A;   COLONOSCOPY     HEMORRHOID SURGERY  1989   lumbar disckectomy  2006   LUMBAR FUSION  2018   POLYPECTOMY     TUBAL LIGATION  1990    Social History   Socioeconomic History   Marital status: Married    Spouse name: Not on file   Number of children: Not on file   Years of education: Not on file   Highest education level: Not on file  Occupational History   Not on file  Tobacco Use  Smoking status: Former    Packs/day: 1.00    Years: 10.00    Pack years: 10.00    Types: Cigarettes    Quit date: 09/18/1991    Years since quitting: 29.1   Smokeless tobacco: Never  Vaping Use   Vaping Use: Never used  Substance and Sexual Activity   Alcohol use: No   Drug use: No    Comment: no IVDA   Sexual activity: Yes  Other Topics Concern   Not on file  Social History Narrative   Married, works as a Radiation protection practitioner.     Social Determinants of Health   Financial Resource Strain: Not on file  Food Insecurity: Not on file  Transportation Needs: Not on file  Physical Activity: Not on file  Stress: Not on file  Social Connections: Not on  file  Intimate Partner Violence: Not on file    Outpatient Encounter Medications as of 10/29/2020  Medication Sig   Ascorbic Acid (VITAMIN C) 1000 MG tablet Take 1,000 mg by mouth daily.   azelastine (OPTIVAR) 0.05 % ophthalmic solution PLACE 1 DROP INTO EACH EYE ONCE DAILY AS NEEDED FOR ALLERGIES   b complex vitamins capsule Take 1 capsule by mouth daily.   buPROPion (WELLBUTRIN XL) 300 MG 24 hr tablet Take 1 tablet (300 mg total) by mouth daily. Dx:  [name]   chlorthalidone (HYGROTON) 25 MG tablet One tablet by mouth daily   clobetasol cream (TEMOVATE) 0.05 % APPLY TO AFFECTED AREA TWICE A DAY   cyclobenzaprine (FLEXERIL) 5 MG tablet Take 5-10 mg by mouth at bedtime as needed.   diltiazem (CARDIZEM CD) 300 MG 24 hr capsule Take 1 capsule (300 mg total) by mouth daily.   escitalopram (LEXAPRO) 10 MG tablet Take 1 tablet (10 mg total) by mouth daily.   famotidine (PEPCID) 20 MG tablet Take 1 tablet (20 mg total) by mouth 2 (two) times daily.   ibuprofen (ADVIL) 800 MG tablet Take 1 tablet (800 mg total) by mouth 3 (three) times daily.   lidocaine (XYLOCAINE) 2 % solution Use as directed 15 mLs in the mouth or throat as needed for mouth pain.   pantoprazole (PROTONIX) 40 MG tablet Take 1 tablet (40 mg total) by mouth 2 (two) times daily before a meal.   Polyethylene Glycol 3350 (MIRALAX PO) 2-3 times a week   pregabalin (LYRICA) 200 MG capsule Take 2 capsules (400 mg total) by mouth at bedtime.   Semaglutide,0.25 or 0.'5MG'$ /DOS, (OZEMPIC, 0.25 OR 0.5 MG/DOSE,) 2 MG/1.5ML SOPN Inject 0.5 mg into the skin once a week.   tiZANidine (ZANAFLEX) 4 MG tablet Take 1 tablet (4 mg total) by mouth 3 (three) times daily.   [DISCONTINUED] oxyCODONE-acetaminophen (PERCOCET) 5-325 MG tablet One each morning, two each afternoon and two at bedtime   oxyCODONE-acetaminophen (PERCOCET) 5-325 MG tablet One each morning, two each afternoon and two at bedtime   potassium chloride (KLOR-CON) 10 MEQ tablet Take 1 tablet  (10 mEq total) by mouth daily. (Patient not taking: Reported on 10/29/2020)   No facility-administered encounter medications on file as of 10/29/2020.    Allergies  Allergen Reactions   Gabapentin Anxiety    Intense, alarming dreams   Amlodipine Swelling   Hydralazine    Hydrocodone     Norco   Morphine And Related Nausea And Vomiting   Promethazine     Burns through the IV   Tramadol Other (See Comments)    Upset stomach    Review of Systems  Constitutional:  Negative for activity change, appetite change, chills, diaphoresis, fatigue, fever and unexpected weight change.  HENT: Negative.    Eyes: Negative.   Respiratory:  Negative for cough, chest tightness and shortness of breath.   Cardiovascular:  Negative for chest pain, palpitations and leg swelling.  Gastrointestinal:  Positive for constipation. Negative for abdominal distention, abdominal pain, anal bleeding, blood in stool, diarrhea, nausea, rectal pain and vomiting.  Endocrine: Negative.   Genitourinary:  Negative for decreased urine volume, difficulty urinating, dysuria, frequency and urgency.  Musculoskeletal:  Positive for arthralgias, back pain and myalgias. Negative for gait problem, joint swelling, neck pain and neck stiffness.  Skin: Negative.   Allergic/Immunologic: Negative.   Neurological:  Negative for tremors, seizures, syncope, facial asymmetry, speech difficulty, weakness, light-headedness, numbness and headaches.  Hematological: Negative.   Psychiatric/Behavioral:  Negative for confusion, hallucinations, sleep disturbance and suicidal ideas.   All other systems reviewed and are negative.      Objective:  BP 132/80   Pulse 76   Temp 98 F (36.7 C) (Temporal)   Resp 20   Ht '5\' 6"'$  (1.676 m)   Wt 172 lb (78 kg)   SpO2 95%   BMI 27.76 kg/m    Wt Readings from Last 3 Encounters:  10/29/20 172 lb (78 kg)  10/03/20 173 lb 6.4 oz (78.7 kg)  09/06/20 171 lb 6.4 oz (77.7 kg)    Physical Exam Vitals  and nursing note reviewed.  Constitutional:      General: She is not in acute distress.    Appearance: Normal appearance. She is well-developed, well-groomed and overweight. She is not ill-appearing, toxic-appearing or diaphoretic.  HENT:     Head: Normocephalic and atraumatic.     Jaw: There is normal jaw occlusion.     Right Ear: Hearing normal.     Left Ear: Hearing normal.     Nose: Nose normal.     Mouth/Throat:     Lips: Pink.     Mouth: Mucous membranes are moist.     Pharynx: Oropharynx is clear. Uvula midline.  Eyes:     General: Lids are normal.     Extraocular Movements: Extraocular movements intact.     Conjunctiva/sclera: Conjunctivae normal.     Pupils: Pupils are equal, round, and reactive to light.  Neck:     Thyroid: No thyroid mass, thyromegaly or thyroid tenderness.     Vascular: No carotid bruit or JVD.     Trachea: Trachea and phonation normal.  Cardiovascular:     Rate and Rhythm: Normal rate and regular rhythm.     Chest Wall: PMI is not displaced.     Pulses: Normal pulses.     Heart sounds: Normal heart sounds. No murmur heard.   No friction rub. No gallop.  Pulmonary:     Effort: Pulmonary effort is normal. No respiratory distress.     Breath sounds: Normal breath sounds. No wheezing.  Abdominal:     General: Bowel sounds are normal. There is no distension or abdominal bruit.     Palpations: Abdomen is soft. There is no hepatomegaly or splenomegaly.     Tenderness: There is no abdominal tenderness. There is no right CVA tenderness or left CVA tenderness.     Hernia: No hernia is present.  Musculoskeletal:     Cervical back: Normal, normal range of motion and neck supple.     Thoracic back: Normal.     Lumbar back: Spasms, tenderness and bony tenderness present.  No swelling, edema, deformity, signs of trauma or lacerations. Decreased range of motion. Negative right straight leg raise test and negative left straight leg raise test. No scoliosis.      Right hip: Normal.     Left hip: Normal.     Right lower leg: No edema.     Left lower leg: No edema.  Lymphadenopathy:     Cervical: No cervical adenopathy.  Skin:    General: Skin is warm and dry.     Capillary Refill: Capillary refill takes less than 2 seconds.     Coloration: Skin is not cyanotic, jaundiced or pale.     Findings: No rash.  Neurological:     General: No focal deficit present.     Mental Status: She is alert and oriented to person, place, and time.     Cranial Nerves: Cranial nerves are intact. No cranial nerve deficit.     Sensory: Sensation is intact. No sensory deficit.     Motor: Motor function is intact. No weakness.     Coordination: Coordination is intact. Coordination normal.     Gait: Gait is intact. Gait normal.     Deep Tendon Reflexes: Reflexes are normal and symmetric. Reflexes normal.  Psychiatric:        Attention and Perception: Attention and perception normal.        Mood and Affect: Mood and affect normal.        Speech: Speech normal.        Behavior: Behavior normal. Behavior is cooperative.        Thought Content: Thought content normal.        Cognition and Memory: Cognition and memory normal.        Judgment: Judgment normal.    Results for orders placed or performed in visit on 10/03/20  ToxASSURE Select 13 (MW), Urine  Result Value Ref Range   Summary Note        Pertinent labs & imaging results that were available during my care of the patient were reviewed by me and considered in my medical decision making.  Assessment & Plan:  Diagnoses and all orders for this visit:  Opiate dependence, continuous (HCC) Lumbar radiculopathy Chronic pain management. Has been doing well on current regimen. Mild constipation from medications, aware to take Miralax daily and drink plenty of water. PCP not in office, will provide 1 month supply of medications. Pt to follow up with PCP in one month or sooner if needed. Discussed importance of taking  medications only as prescribed.  -     oxyCODONE-acetaminophen (PERCOCET) 5-325 MG tablet; One each morning, two each afternoon and two at bedtime    Continue all other maintenance medications.  Follow up plan: Return in about 1 month (around 11/29/2020), or if symptoms worsen or fail to improve.   Continue healthy lifestyle choices, including diet (rich in fruits, vegetables, and lean proteins, and low in salt and simple carbohydrates) and exercise (at least 30 minutes of moderate physical activity daily).  Educational handout given for chronic pain  The above assessment and management plan was discussed with the patient. The patient verbalized understanding of and has agreed to the management plan. Patient is aware to call the clinic if they develop any new symptoms or if symptoms persist or worsen. Patient is aware when to return to the clinic for a follow-up visit. Patient educated on when it is appropriate to go to the emergency department.   Monia Pouch, FNP-C [facility] [phone]

## 2020-11-18 ENCOUNTER — Other Ambulatory Visit: Payer: Self-pay | Admitting: Family Medicine

## 2020-11-18 DIAGNOSIS — K219 Gastro-esophageal reflux disease without esophagitis: Secondary | ICD-10-CM

## 2020-11-29 ENCOUNTER — Ambulatory Visit: Payer: BC Managed Care – PPO | Admitting: Family Medicine

## 2020-11-29 ENCOUNTER — Other Ambulatory Visit: Payer: Self-pay

## 2020-11-29 ENCOUNTER — Encounter: Payer: Self-pay | Admitting: Family Medicine

## 2020-11-29 VITALS — BP 135/85 | HR 87 | Temp 97.9°F | Ht 66.0 in | Wt 168.8 lb

## 2020-11-29 DIAGNOSIS — F1120 Opioid dependence, uncomplicated: Secondary | ICD-10-CM

## 2020-11-29 DIAGNOSIS — Z23 Encounter for immunization: Secondary | ICD-10-CM | POA: Diagnosis not present

## 2020-11-29 DIAGNOSIS — M5416 Radiculopathy, lumbar region: Secondary | ICD-10-CM

## 2020-11-29 MED ORDER — OZEMPIC (0.25 OR 0.5 MG/DOSE) 2 MG/1.5ML ~~LOC~~ SOPN: 0.5000 mg | PEN_INJECTOR | SUBCUTANEOUS | 0 refills | Status: DC

## 2020-11-29 MED ORDER — AMOXICILLIN-POT CLAVULANATE 875-125 MG PO TABS: 1.0000 | ORAL_TABLET | Freq: Two times a day (BID) | ORAL | 0 refills | Status: DC

## 2020-11-29 MED ORDER — OXYCODONE-ACETAMINOPHEN 5-325 MG PO TABS: ORAL_TABLET | ORAL | 0 refills | Status: DC

## 2020-11-29 NOTE — Progress Notes (Signed)
Subjective:  Patient ID: Lisa Parrish, female    DOB: 1960/11/14  Age: 60 y.o. MRN: [id number]  CC: Medication Refill   HPI Aubrea Meixner presents for Pain assessment: Cause of pain- lumbar radiculopathy Pain location- lower back  Pain on scale of 1-10- 6/10 Frequency- daily What increases pain- driving What makes pain Better- ice, heat Effects on ADL - d Any change in general medical condition-none  Current opioids rx- percocet 5/325 # meds rx- 150/month Effectiveness of current meds-adequate , reduces to 4.5-5/10 Adverse reactions from pain meds-sleepiness Morphine equivalent- 37.5  Pill count performed-No Last drug screen - 10/03/20 ( high risk q53m, moderate risk q91m, low risk yearly ) Urine drug screen today- No Was the [place] reviewed- yes  If yes were their any concerning findings? - none   Overdose risk: 190 Opioid Risk  10/29/2020  Alcohol 0  Illegal Drugs 0  Rx Drugs 0  Alcohol 0  Illegal Drugs 0  Rx Drugs 0  Age between 16-45 years  0  History of Preadolescent Sexual Abuse 0  Psychological Disease 0  ADD Negative  OCD Negative  Bipolar Negative  Depression 1  Opioid Risk Tool Scoring 1  Opioid Risk Interpretation Low Risk      Depression screen [facility] 2/9 11/29/2020 10/29/2020 10/03/2020  Decreased Interest 0 0 0  Down, Depressed, Hopeless 0 0 0  PHQ - 2 Score 0 0 0  Altered sleeping - 0 -  Tired, decreased energy - 0 -  Change in appetite - 0 -  Feeling bad or failure about yourself  - 0 -  Trouble concentrating - 0 -  Moving slowly or fidgety/restless - 0 -  Suicidal thoughts - 0 -  PHQ-9 Score - 0 -  Difficult doing work/chores - - -  Some recent data might be hidden    History Ciarah has a past medical history of Allergy, Anxiety, Anxiety state (08/24/2012), Basal cell carcinoma, Chronic left SI joint pain, Constipation, Depression, GERD (gastroesophageal reflux disease), Headache, migraine, Hyperlipidemia, Hypertension, Kidney  stones, Osteoarthritis, Pulmonary HTN ([place]) (12/21/2014), S/P cardiac cath 12/20/14 normal coronary arteries (12/21/2014), and Seasonal allergies.   She has a past surgical history that includes Hemorrhoid surgery (1989); Tubal ligation (1990); lumbar disckectomy (2006); Cardiac catheterization (N/A, 12/20/2014); Colonoscopy; Polypectomy; and Lumbar fusion (2018).   Her family history includes Cancer in her mother; Colon cancer in her paternal uncle; Colon cancer (age of onset: 21) in her paternal aunt and paternal uncle; Esophageal cancer in her brother; Heart disease in her father.She reports that she quit smoking about 29 years ago. Her smoking use included cigarettes. She has a 10.00 pack-year smoking history. She has never used smokeless tobacco. She reports that she does not drink alcohol and does not use drugs.    ROS Review of Systems  Constitutional: Negative.   HENT: Negative.    Eyes:  Negative for visual disturbance.  Respiratory:  Negative for shortness of breath.   Cardiovascular:  Negative for chest pain.  Gastrointestinal:  Negative for abdominal pain.  Musculoskeletal:  Positive for arthralgias and back pain.   Objective:  BP 135/85   Pulse 87   Temp 97.9 F (36.6 C)   Ht 5\' 6"  (1.676 m)   Wt 168 lb 12.8 oz (76.6 kg)   SpO2 97%   BMI 27.25 kg/m   BP Readings from Last 3 Encounters:  11/29/20 135/85  10/29/20 132/80  10/03/20 (!) 152/95    Wt Readings from Last 3 Encounters:  11/29/20 168 lb 12.8 oz (76.6 kg)  10/29/20 172 lb (78 kg)  10/03/20 173 lb 6.4 oz (78.7 kg)     Physical Exam Constitutional:      General: She is not in acute distress.    Appearance: She is well-developed.  HENT:     Head: Normocephalic and atraumatic.  Eyes:     Conjunctiva/sclera: Conjunctivae normal.     Pupils: Pupils are equal, round, and reactive to light.  Cardiovascular:     Rate and Rhythm: Normal rate and regular rhythm.     Heart sounds: Normal heart sounds. No  murmur heard. Pulmonary:     Effort: Pulmonary effort is normal. No respiratory distress.     Breath sounds: Normal breath sounds. No wheezing or rales.  Abdominal:     General: Bowel sounds are normal. There is no distension.     Palpations: Abdomen is soft.     Tenderness: There is no abdominal tenderness.  Musculoskeletal:        General: Tenderness present.     Cervical back: Normal range of motion and neck supple.     Lumbar back: Spasms and tenderness present. No deformity. Decreased range of motion.  Skin:    General: Skin is warm and dry.  Neurological:     Mental Status: She is alert and oriented to person, place, and time.     Deep Tendon Reflexes: Reflexes are normal and symmetric.  Psychiatric:        Behavior: Behavior normal.        Thought Content: Thought content normal.      Assessment & Plan:   Airelle was seen today for medication refill.  Diagnoses and all orders for this visit:  Lumbar radiculopathy -     oxyCODONE-acetaminophen (PERCOCET) 5-325 MG tablet; One each morning, two each afternoon and two at bedtime -     oxyCODONE-acetaminophen (PERCOCET) 5-325 MG tablet; One each morning, two each afternoon and two at bedtime -     oxyCODONE-acetaminophen (PERCOCET) 5-325 MG tablet; One each morning, two each afternoon and two at bedtime  Opiate dependence, continuous (HCC) -     oxyCODONE-acetaminophen (PERCOCET) 5-325 MG tablet; One each morning, two each afternoon and two at bedtime -     oxyCODONE-acetaminophen (PERCOCET) 5-325 MG tablet; One each morning, two each afternoon and two at bedtime -     oxyCODONE-acetaminophen (PERCOCET) 5-325 MG tablet; One each morning, two each afternoon and two at bedtime  Other orders -     Semaglutide,0.25 or 0.5MG /DOS, (OZEMPIC, 0.25 OR 0.5 MG/DOSE,) 2 MG/1.5ML SOPN; Inject 0.5 mg into the skin once a week. -     amoxicillin-clavulanate (AUGMENTIN) 875-125 MG tablet; Take 1 tablet by mouth 2 (two) times daily. Take all  of this medication      I am having Kyree Fedorko start on amoxicillin-clavulanate, oxyCODONE-acetaminophen, and oxyCODONE-acetaminophen. I am also having her maintain her vitamin C, b complex vitamins, Polyethylene Glycol 3350 (MIRALAX PO), cyclobenzaprine, buPROPion, chlorthalidone, diltiazem, escitalopram, pantoprazole, potassium chloride, lidocaine, ibuprofen, azelastine, clobetasol cream, pregabalin, tiZANidine, famotidine, oxyCODONE-acetaminophen, and Ozempic (0.25 or 0.5 MG/DOSE).  Allergies as of 11/29/2020       Reactions   Gabapentin Anxiety   Intense, alarming dreams   Amlodipine Swelling   Hydralazine    Hydrocodone    Norco   Morphine And Related Nausea And Vomiting   Promethazine    Burns through the IV   Tramadol Other (See Comments)   Upset stomach  Medication List        Accurate as of November 29, 2020  2:30 PM. If you have any questions, ask your nurse or doctor.          amoxicillin-clavulanate 875-125 MG tablet Commonly known as: AUGMENTIN Take 1 tablet by mouth 2 (two) times daily. Take all of this medication Started by: Claretta Fraise, MD   azelastine 0.05 % ophthalmic solution Commonly known as: OPTIVAR PLACE 1 DROP INTO EACH EYE ONCE DAILY AS NEEDED FOR ALLERGIES   b complex vitamins capsule Take 1 capsule by mouth daily.   buPROPion 300 MG 24 hr tablet Commonly known as: WELLBUTRIN XL Take 1 tablet (300 mg total) by mouth daily. Dx:  [name]   chlorthalidone 25 MG tablet Commonly known as: HYGROTON One tablet by mouth daily   clobetasol cream 0.05 % Commonly known as: TEMOVATE APPLY TO AFFECTED AREA TWICE A DAY   cyclobenzaprine 5 MG tablet Commonly known as: FLEXERIL Take 5-10 mg by mouth at bedtime as needed.   diltiazem 300 MG 24 hr capsule Commonly known as: CARDIZEM CD Take 1 capsule (300 mg total) by mouth daily.   escitalopram 10 MG tablet Commonly known as: LEXAPRO Take 1 tablet (10 mg total) by mouth  daily.   famotidine 20 MG tablet Commonly known as: PEPCID TAKE 1 TABLET BY MOUTH TWICE A DAY   ibuprofen 800 MG tablet Commonly known as: ADVIL Take 1 tablet (800 mg total) by mouth 3 (three) times daily.   lidocaine 2 % solution Commonly known as: XYLOCAINE Use as directed 15 mLs in the mouth or throat as needed for mouth pain.   MIRALAX PO 2-3 times a week   oxyCODONE-acetaminophen 5-325 MG tablet Commonly known as: Percocet One each morning, two each afternoon and two at bedtime What changed: Another medication with the same name was added. Make sure you understand how and when to take each. Changed by: Claretta Fraise, MD   oxyCODONE-acetaminophen 5-325 MG tablet Commonly known as: Percocet One each morning, two each afternoon and two at bedtime Start taking on: December 29, 2020 What changed: You were already taking a medication with the same name, and this prescription was added. Make sure you understand how and when to take each. Changed by: Claretta Fraise, MD   oxyCODONE-acetaminophen 5-325 MG tablet Commonly known as: Percocet One each morning, two each afternoon and two at bedtime Start taking on: January 28, 2021 What changed: You were already taking a medication with the same name, and this prescription was added. Make sure you understand how and when to take each. Changed by: Claretta Fraise, MD   Ozempic (0.25 or 0.5 MG/DOSE) 2 MG/1.5ML Sopn Generic drug: Semaglutide(0.25 or 0.5MG /DOS) Inject 0.5 mg into the skin once a week.   pantoprazole 40 MG tablet Commonly known as: PROTONIX Take 1 tablet (40 mg total) by mouth 2 (two) times daily before a meal.   potassium chloride 10 MEQ tablet Commonly known as: KLOR-CON Take 1 tablet (10 mEq total) by mouth daily.   pregabalin 200 MG capsule Commonly known as: LYRICA Take 2 capsules (400 mg total) by mouth at bedtime.   tiZANidine 4 MG tablet Commonly known as: ZANAFLEX TAKE 1 TABLET BY MOUTH THREE TIMES A  DAY   vitamin C 1000 MG tablet Take 1,000 mg by mouth daily.         Follow-up: Return in about 3 months (around 02/28/2021).  Claretta Fraise, M.D.

## 2020-12-03 ENCOUNTER — Telehealth: Payer: Self-pay | Admitting: Family Medicine

## 2020-12-03 MED ORDER — CEFDINIR 300 MG PO CAPS: 300.0000 mg | ORAL_CAPSULE | Freq: Two times a day (BID) | ORAL | 0 refills | Status: DC

## 2020-12-03 NOTE — Telephone Encounter (Signed)
Pt is taking the medication for right ear infection. Temp is 98.7 which is low grade for her since she is usually 96, 97. She is taking the Amox with food and it does not help. Please send new ATB to [facility] in [place].

## 2020-12-03 NOTE — Telephone Encounter (Signed)
That depends on why she was prescribed it, please ask her and let me know, also take probiotic with it, that will help with symptoms

## 2020-12-03 NOTE — Telephone Encounter (Signed)
Pt informed

## 2020-12-03 NOTE — Telephone Encounter (Signed)
Patient's mother was diagnosed with an ear infection, sent cefdinir because she was having problems with amoxicillin

## 2020-12-10 ENCOUNTER — Encounter: Payer: Self-pay | Admitting: Family Medicine

## 2020-12-15 ENCOUNTER — Other Ambulatory Visit: Payer: Self-pay | Admitting: Family Medicine

## 2020-12-15 DIAGNOSIS — K219 Gastro-esophageal reflux disease without esophagitis: Secondary | ICD-10-CM

## 2020-12-15 DIAGNOSIS — I10 Essential (primary) hypertension: Secondary | ICD-10-CM

## 2020-12-24 ENCOUNTER — Encounter: Payer: Self-pay | Admitting: Family Medicine

## 2020-12-24 DIAGNOSIS — Z0289 Encounter for other administrative examinations: Secondary | ICD-10-CM

## 2020-12-28 ENCOUNTER — Other Ambulatory Visit: Payer: Self-pay | Admitting: Family Medicine

## 2021-01-27 ENCOUNTER — Other Ambulatory Visit: Payer: Self-pay | Admitting: Family Medicine

## 2021-02-09 ENCOUNTER — Other Ambulatory Visit: Payer: Self-pay | Admitting: Family Medicine

## 2021-02-09 DIAGNOSIS — K219 Gastro-esophageal reflux disease without esophagitis: Secondary | ICD-10-CM

## 2021-02-20 ENCOUNTER — Ambulatory Visit (INDEPENDENT_AMBULATORY_CARE_PROVIDER_SITE_OTHER): Payer: BC Managed Care – PPO | Admitting: Family Medicine

## 2021-02-20 ENCOUNTER — Encounter: Payer: Self-pay | Admitting: Family Medicine

## 2021-02-20 VITALS — BP 162/100 | HR 78 | Temp 98.0°F | Ht 66.0 in | Wt 173.8 lb

## 2021-02-20 DIAGNOSIS — M7138 Other bursal cyst, other site: Secondary | ICD-10-CM

## 2021-02-20 DIAGNOSIS — E785 Hyperlipidemia, unspecified: Secondary | ICD-10-CM

## 2021-02-20 DIAGNOSIS — F32A Depression, unspecified: Secondary | ICD-10-CM

## 2021-02-20 DIAGNOSIS — I10 Essential (primary) hypertension: Secondary | ICD-10-CM

## 2021-02-20 DIAGNOSIS — M5416 Radiculopathy, lumbar region: Secondary | ICD-10-CM | POA: Diagnosis not present

## 2021-02-20 DIAGNOSIS — Z029 Encounter for administrative examinations, unspecified: Secondary | ICD-10-CM

## 2021-02-20 DIAGNOSIS — F1120 Opioid dependence, uncomplicated: Secondary | ICD-10-CM

## 2021-02-20 DIAGNOSIS — K219 Gastro-esophageal reflux disease without esophagitis: Secondary | ICD-10-CM

## 2021-02-20 MED ORDER — CHLORTHALIDONE 25 MG PO TABS
ORAL_TABLET | ORAL | 3 refills | Status: DC
Start: 2021-02-20 — End: 2021-12-02

## 2021-02-20 MED ORDER — DILTIAZEM HCL ER COATED BEADS 300 MG PO CP24: ORAL_CAPSULE | ORAL | 3 refills | Status: DC

## 2021-02-20 MED ORDER — OXYCODONE-ACETAMINOPHEN 5-325 MG PO TABS: ORAL_TABLET | ORAL | 0 refills | Status: DC

## 2021-02-20 MED ORDER — ESCITALOPRAM OXALATE 10 MG PO TABS: 10.0000 mg | ORAL_TABLET | Freq: Every day | ORAL | 3 refills | Status: DC

## 2021-02-20 MED ORDER — PANTOPRAZOLE SODIUM 40 MG PO TBEC
40.0000 mg | DELAYED_RELEASE_TABLET | Freq: Two times a day (BID) | ORAL | 3 refills | Status: DC
Start: 2021-02-20 — End: 2021-12-02

## 2021-02-20 MED ORDER — BUPROPION HCL ER (XL) 300 MG PO TB24
300.0000 mg | ORAL_TABLET | Freq: Every day | ORAL | 3 refills | Status: DC
Start: 2021-02-20 — End: 2021-12-02

## 2021-02-20 MED ORDER — POTASSIUM CHLORIDE ER 10 MEQ PO TBCR: 10.0000 meq | EXTENDED_RELEASE_TABLET | Freq: Every day | ORAL | 3 refills | Status: DC

## 2021-02-20 MED ORDER — OZEMPIC (1 MG/DOSE) 4 MG/3ML ~~LOC~~ SOPN: 1.0000 mg | PEN_INJECTOR | SUBCUTANEOUS | 5 refills | Status: DC

## 2021-02-20 NOTE — Progress Notes (Signed)
Subjective:  Patient ID: Lisa Parrish, female    DOB: 01-05-1961  Age: 60 y.o. MRN: [id number]  CC: Medical Management of Chronic Issues   HPI Lisa Parrish presents for  presents for  follow-up of hypertension. Patient has no history of headache chest pain or shortness of breath or recent cough. Patient also denies symptoms of TIA such as focal numbness or weakness. Patient denies side effects from medication. States taking it regularly.  Till having lumbar pain with radiculopathy. Partial relief from the oxycodone. Daily MMe = 37.5. Also using Lyrica for additional relief. PDMP review shows appropriate utilization of the patient's controlled medications.    Depression screen [facility] 2/9 02/20/2021 11/29/2020 10/29/2020  Decreased Interest 0 0 0  Down, Depressed, Hopeless 0 0 0  PHQ - 2 Score 0 0 0  Altered sleeping - - 0  Tired, decreased energy - - 0  Change in appetite - - 0  Feeling bad or failure about yourself  - - 0  Trouble concentrating - - 0  Moving slowly or fidgety/restless - - 0  Suicidal thoughts - - 0  PHQ-9 Score - - 0  Difficult doing work/chores - - -  Some recent data might be hidden    History Lisa Parrish has a past medical history of Allergy, Anxiety, Anxiety state (08/24/2012), Basal cell carcinoma, Chronic left SI joint pain, Constipation, Depression, GERD (gastroesophageal reflux disease), Headache, migraine, Hyperlipidemia, Hypertension, Kidney stones, Osteoarthritis, Pulmonary HTN ([place]) (12/21/2014), S/P cardiac cath 12/20/14 normal coronary arteries (12/21/2014), and Seasonal allergies.   Lisa Parrish has a past surgical history that includes Hemorrhoid surgery (1989); Tubal ligation (1990); lumbar disckectomy (2006); Cardiac catheterization (N/A, 12/20/2014); Colonoscopy; Polypectomy; and Lumbar fusion (2018).   Lisa Parrish family history includes Cancer in Lisa Parrish mother; Colon cancer in Lisa Parrish paternal uncle; Colon cancer (age of onset: 26) in Lisa Parrish paternal aunt and paternal  uncle; Esophageal cancer in Lisa Parrish brother; Heart disease in Lisa Parrish father.Lisa Parrish reports that Lisa Parrish quit smoking about 29 years ago. Lisa Parrish smoking use included cigarettes. Lisa Parrish has a 10.00 pack-year smoking history. Lisa Parrish has never used smokeless tobacco. Lisa Parrish reports that Lisa Parrish does not drink alcohol and does not use drugs.    ROS Review of Systems  Constitutional: Negative.   HENT: Negative.    Eyes:  Negative for visual disturbance.  Respiratory:  Negative for shortness of breath.   Cardiovascular:  Negative for chest pain.  Gastrointestinal:  Negative for abdominal pain.  Musculoskeletal:  Negative for arthralgias.   Objective:  BP (!) 162/100    Pulse 78    Temp 98 F (36.7 C)    Ht _0  (1.676 m)    Wt 173 lb 12.8 oz (78.8 kg)    SpO2 99%    BMI 28.05 kg/m   BP Readings from Last 3 Encounters:  02/20/21 (!) 162/100  11/29/20 135/85  10/29/20 132/80    Wt Readings from Last 3 Encounters:  02/20/21 173 lb 12.8 oz (78.8 kg)  11/29/20 168 lb 12.8 oz (76.6 kg)  10/29/20 172 lb (78 kg)     Physical Exam Constitutional:      General: Lisa Parrish is not in acute distress.    Appearance: Lisa Parrish is well-developed.  Cardiovascular:     Rate and Rhythm: Normal rate and regular rhythm.  Pulmonary:     Breath sounds: Normal breath sounds.  Musculoskeletal:        General: Normal range of motion.  Skin:    General: Skin is warm and dry.  Neurological:  Mental Status: Lisa Parrish is alert and oriented to person, place, and time.      Assessment & Plan:   Lisa Parrish was seen today for medical management of chronic issues.  Diagnoses and all orders for this visit:  Essential hypertension -     CBC with Differential/Platelet -     CMP14+EGFR -     chlorthalidone (HYGROTON) 25 MG tablet; TAKE 1 TABLET BY MOUTH EVERY DAY -     diltiazem (CARDIZEM CD) 300 MG 24 hr capsule; TAKE 1 CAPSULE BY MOUTH EVERY DAY  Hyperlipidemia, unspecified hyperlipidemia type -     Lipid panel  Depression, unspecified  depression type -     buPROPion (WELLBUTRIN XL) 300 MG 24 hr tablet; Take 1 tablet (300 mg total) by mouth daily. Dx:  Lisa Parrish  Lumbar radiculopathy -     oxyCODONE-acetaminophen (PERCOCET) 5-325 MG tablet; One each morning, two each afternoon and two at bedtime -     oxyCODONE-acetaminophen (PERCOCET) 5-325 MG tablet; One each morning, two each afternoon and two at bedtime -     oxyCODONE-acetaminophen (PERCOCET) 5-325 MG tablet; One each morning, two each afternoon and two at bedtime  Opiate dependence, continuous (HCC) -     oxyCODONE-acetaminophen (PERCOCET) 5-325 MG tablet; One each morning, two each afternoon and two at bedtime -     oxyCODONE-acetaminophen (PERCOCET) 5-325 MG tablet; One each morning, two each afternoon and two at bedtime -     oxyCODONE-acetaminophen (PERCOCET) 5-325 MG tablet; One each morning, two each afternoon and two at bedtime  Gastroesophageal reflux disease without esophagitis -     pantoprazole (PROTONIX) 40 MG tablet; Take 1 tablet (40 mg total) by mouth 2 (two) times daily before a meal.  Synovial cyst of lumbar facet joint  Other orders -     escitalopram (LEXAPRO) 10 MG tablet; Take 1 tablet (10 mg total) by mouth daily. -     potassium chloride (KLOR-CON) 10 MEQ tablet; Take 1 tablet (10 mEq total) by mouth daily. -     Semaglutide, 1 MG/DOSE, (OZEMPIC, 1 MG/DOSE,) 4 MG/3ML SOPN; Inject 1 mg into the skin once a week.       I have discontinued Lisa Parrish's cefdinir. I am also having Lisa Parrish start on Ozempic (1 MG/DOSE). Additionally, I am having Lisa Parrish maintain Lisa Parrish vitamin C, b complex vitamins, Polyethylene Glycol 3350 (MIRALAX PO), cyclobenzaprine, lidocaine, ibuprofen, azelastine, clobetasol cream, pregabalin, tiZANidine, Ozempic (0.25 or 0.5 MG/DOSE), famotidine, buPROPion, chlorthalidone, diltiazem, escitalopram, oxyCODONE-acetaminophen, oxyCODONE-acetaminophen, oxyCODONE-acetaminophen, pantoprazole, and potassium chloride.  Allergies as of  02/20/2021       Reactions   Gabapentin Anxiety   Intense, alarming dreams   Amlodipine Swelling   Hydralazine    Hydrocodone    Norco   Morphine And Related Nausea And Vomiting   Promethazine    Burns through the IV   Tramadol Other (See Comments)   Upset stomach        Medication List        Accurate as of February 20, 2021  3:36 PM. If you have any questions, ask your nurse or doctor.          STOP taking these medications    cefdinir 300 MG capsule Commonly known as: OMNICEF Stopped by: Claretta Fraise, MD       TAKE these medications    azelastine 0.05 % ophthalmic solution Commonly known as: OPTIVAR PLACE 1 DROP INTO EACH EYE ONCE DAILY AS NEEDED FOR ALLERGIES   b complex vitamins capsule  Take 1 capsule by mouth daily.   buPROPion 300 MG 24 hr tablet Commonly known as: WELLBUTRIN XL Take 1 tablet (300 mg total) by mouth daily. Dx:  Lisa Parrish   chlorthalidone 25 MG tablet Commonly known as: HYGROTON TAKE 1 TABLET BY MOUTH EVERY DAY   clobetasol cream 0.05 % Commonly known as: TEMOVATE APPLY TO AFFECTED AREA TWICE A DAY   cyclobenzaprine 5 MG tablet Commonly known as: FLEXERIL Take 5-10 mg by mouth at bedtime as needed.   diltiazem 300 MG 24 hr capsule Commonly known as: CARDIZEM CD TAKE 1 CAPSULE BY MOUTH EVERY DAY What changed:  how much to take how to take this when to take this additional instructions Changed by: Claretta Fraise, MD   escitalopram 10 MG tablet Commonly known as: LEXAPRO Take 1 tablet (10 mg total) by mouth daily.   famotidine 20 MG tablet Commonly known as: PEPCID TAKE 1 TABLET BY MOUTH TWICE A DAY   ibuprofen 800 MG tablet Commonly known as: ADVIL Take 1 tablet (800 mg total) by mouth 3 (three) times daily.   lidocaine 2 % solution Commonly known as: XYLOCAINE Use as directed 15 mLs in the mouth or throat as needed for mouth pain.   MIRALAX PO 2-3 times a week   oxyCODONE-acetaminophen 5-325 MG  tablet Commonly known as: Percocet One each morning, two each afternoon and two at bedtime Start taking on: February 28, 2021 What changed: These instructions start on February 28, 2021. If you are unsure what to do until then, ask your doctor or other care provider. Changed by: Claretta Fraise, MD   oxyCODONE-acetaminophen 5-325 MG tablet Commonly known as: Percocet One each morning, two each afternoon and two at bedtime Start taking on: March 30, 2021 What changed: These instructions start on March 30, 2021. If you are unsure what to do until then, ask your doctor or other care provider. Changed by: Claretta Fraise, MD   oxyCODONE-acetaminophen 5-325 MG tablet Commonly known as: Percocet One each morning, two each afternoon and two at bedtime Start taking on: April 29, 2021 What changed: These instructions start on April 29, 2021. If you are unsure what to do until then, ask your doctor or other care provider. Changed by: Claretta Fraise, MD   Ozempic (0.25 or 0.5 MG/DOSE) 2 MG/1.5ML Sopn Generic drug: Semaglutide(0.25 or 0.5MG/DOS) INJECT 0.5 MG INTO THE SKIN ONCE A WEEK. What changed: Another medication with the same name was added. Make sure you understand how and when to take each. Changed by: Claretta Fraise, MD   Ozempic (1 MG/DOSE) 4 MG/3ML Sopn Generic drug: Semaglutide (1 MG/DOSE) Inject 1 mg into the skin once a week. What changed: You were already taking a medication with the same name, and this prescription was added. Make sure you understand how and when to take each. Changed by: Claretta Fraise, MD   pantoprazole 40 MG tablet Commonly known as: PROTONIX Take 1 tablet (40 mg total) by mouth 2 (two) times daily before a meal.   potassium chloride 10 MEQ tablet Commonly known as: KLOR-CON Take 1 tablet (10 mEq total) by mouth daily.   pregabalin 200 MG capsule Commonly known as: LYRICA Take 2 capsules (400 mg total) by mouth at bedtime.   tiZANidine 4 MG  tablet Commonly known as: ZANAFLEX TAKE 1 TABLET BY MOUTH THREE TIMES A DAY   vitamin C 1000 MG tablet Take 1,000 mg by mouth daily.         Follow-up: Return in about  3 months (around 05/21/2021).  Claretta Fraise, M.D.

## 2021-02-21 LAB — LIPID PANEL
Chol/HDL Ratio: 3.9 ratio (ref 0.0–4.4)
Cholesterol, Total: 239 mg/dL — ABNORMAL HIGH (ref 100–199)
HDL: 61 mg/dL (ref >39)
LDL Chol Calc (NIH): 162 mg/dL — ABNORMAL HIGH (ref 0–99)
Triglycerides: 91 mg/dL (ref 0–149)
VLDL Cholesterol Cal: 16 mg/dL (ref 5–40)

## 2021-02-21 LAB — CBC WITH DIFFERENTIAL/PLATELET
Basophils Absolute: 0.1 10*3/uL (ref 0.0–0.2)
Basos: 1 %
EOS (ABSOLUTE): 0.2 10*3/uL (ref 0.0–0.4)
Eos: 3 %
Hematocrit: 40.6 % (ref 34.0–46.6)
Hemoglobin: 13.8 g/dL (ref 11.1–15.9)
Immature Grans (Abs): 0 10*3/uL (ref 0.0–0.1)
Immature Granulocytes: 0 %
Lymphocytes Absolute: 1.9 10*3/uL (ref 0.7–3.1)
Lymphs: 31 %
MCH: 29.5 pg (ref 26.6–33.0)
MCHC: 34 g/dL (ref 31.5–35.7)
MCV: 87 fL (ref 79–97)
Monocytes Absolute: 0.6 10*3/uL (ref 0.1–0.9)
Monocytes: 10 %
Neutrophils Absolute: 3.3 10*3/uL (ref 1.4–7.0)
Neutrophils: 55 %
Platelets: 290 10*3/uL (ref 150–450)
RBC: 4.68 x10E6/uL (ref 3.77–5.28)
RDW: 12.7 % (ref 11.7–15.4)
WBC: 6.1 10*3/uL (ref 3.4–10.8)

## 2021-02-21 LAB — CMP14+EGFR
ALT: 15 IU/L (ref 0–32)
AST: 16 IU/L (ref 0–40)
Albumin/Globulin Ratio: 2 (ref 1.2–2.2)
Albumin: 4.9 g/dL (ref 3.8–4.9)
Alkaline Phosphatase: 90 IU/L (ref 44–121)
BUN/Creatinine Ratio: 27 (ref 12–28)
BUN: 19 mg/dL (ref 8–27)
Bilirubin Total: <0.2 mg/dL (ref 0.0–1.2)
CO2: 28 mmol/L (ref 20–29)
Calcium: 9.9 mg/dL (ref 8.7–10.3)
Chloride: 98 mmol/L (ref 96–106)
Creatinine, Ser: 0.71 mg/dL (ref 0.57–1.00)
Globulin, Total: 2.4 g/dL (ref 1.5–4.5)
Glucose: 86 mg/dL (ref 70–99)
Potassium: 3.8 mmol/L (ref 3.5–5.2)
Sodium: 140 mmol/L (ref 134–144)
Total Protein: 7.3 g/dL (ref 6.0–8.5)
eGFR: 97 mL/min/{1.73_m2} (ref >59)

## 2021-02-26 ENCOUNTER — Other Ambulatory Visit: Payer: Self-pay | Admitting: Family Medicine

## 2021-02-26 MED ORDER — ROSUVASTATIN CALCIUM 10 MG PO TABS: 10.0000 mg | ORAL_TABLET | Freq: Every day | ORAL | 1 refills | Status: DC

## 2021-03-22 ENCOUNTER — Encounter: Payer: Self-pay | Admitting: Family Medicine

## 2021-04-26 ENCOUNTER — Other Ambulatory Visit: Payer: Self-pay | Admitting: Family Medicine

## 2021-04-29 ENCOUNTER — Other Ambulatory Visit: Payer: Self-pay | Admitting: Family Medicine

## 2021-04-29 DIAGNOSIS — K219 Gastro-esophageal reflux disease without esophagitis: Secondary | ICD-10-CM

## 2021-05-22 ENCOUNTER — Other Ambulatory Visit: Payer: Self-pay | Admitting: Family Medicine

## 2021-05-22 ENCOUNTER — Encounter: Payer: Self-pay | Admitting: Family Medicine

## 2021-05-22 ENCOUNTER — Ambulatory Visit: Payer: BC Managed Care – PPO | Admitting: Family Medicine

## 2021-05-22 VITALS — BP 130/83 | HR 87 | Temp 97.9°F | Ht 66.0 in | Wt 170.6 lb

## 2021-05-22 DIAGNOSIS — M5416 Radiculopathy, lumbar region: Secondary | ICD-10-CM

## 2021-05-22 DIAGNOSIS — E785 Hyperlipidemia, unspecified: Secondary | ICD-10-CM

## 2021-05-22 DIAGNOSIS — I10 Essential (primary) hypertension: Secondary | ICD-10-CM

## 2021-05-22 DIAGNOSIS — F1120 Opioid dependence, uncomplicated: Secondary | ICD-10-CM | POA: Diagnosis not present

## 2021-05-22 MED ORDER — OXYCODONE-ACETAMINOPHEN 5-325 MG PO TABS: ORAL_TABLET | ORAL | 0 refills | Status: DC

## 2021-05-22 MED ORDER — PREGABALIN 200 MG PO CAPS
400.0000 mg | ORAL_CAPSULE | Freq: Every day | ORAL | 3 refills | Status: DC
Start: 2021-05-22 — End: 2022-03-03

## 2021-05-22 NOTE — Progress Notes (Signed)
**Note Lisa-Identified via Obfuscation** ? ?Subjective:  ?Patient ID: Lisa Parrish, female    DOB: 09/09/1960  Age: 60 y.o. MRN: [id number] ? ?CC: Medical Management of Chronic Issues ? ? ?HPI ?Lisa Parrish presents for pain down left posterior thigh for a week. 9/10 constant.  PDMP review shows appropriate utilization of the patient's controlled medications. Back pain is moderate.  ? ?Stress "unreal." Related to going to mediation soon for her workers comp claim.  ? presents for  follow-up of hypertension. Patient has no history of headache chest pain or shortness of breath or recent cough. Patient also denies symptoms of TIA such as focal numbness or weakness. Patient denies side effects from medication. States taking it regularly. ? ? ? ?  05/22/2021  ?  3:11 PM 02/20/2021  ?  1:36 PM 11/29/2020  ?  1:39 PM  ?Depression screen PHQ 2/9  ?Decreased Interest 0 0 0  ?Down, Depressed, Hopeless 0 0 0  ?PHQ - 2 Score 0 0 0  ? ? ?History ?Olar has a past medical history of Allergy, Anxiety, Anxiety state (08/24/2012), Basal cell carcinoma, Chronic left SI joint pain, Constipation, Depression, GERD (gastroesophageal reflux disease), Headache, migraine, Hyperlipidemia, Hypertension, Kidney stones, Osteoarthritis, Pulmonary HTN (HCC) (12/21/2014), S/P cardiac cath 12/20/14 normal coronary arteries (12/21/2014), and Seasonal allergies.  ? ?She has a past surgical history that includes Hemorrhoid surgery (1989); Tubal ligation (1990); lumbar disckectomy (2006); Cardiac catheterization (N/A, 12/20/2014); Colonoscopy; Polypectomy; and Lumbar fusion (2018).  ? ?Her family history includes Cancer in her mother; Colon cancer in her paternal uncle; Colon cancer (age of onset: 65) in her paternal aunt and paternal uncle; Esophageal cancer in her brother; Heart disease in her father.She reports that she quit smoking about 29 years ago. Her smoking use included cigarettes. She has a 10.00 pack-year smoking history. She has never used smokeless tobacco. She reports  that she does not drink alcohol and does not use drugs. ? ? ? ?ROS ?Review of Systems  ?Constitutional: Negative.   ?HENT: Negative.    ?Eyes:  Negative for visual disturbance.  ?Respiratory:  Negative for shortness of breath.   ?Cardiovascular:  Negative for chest pain.  ?Gastrointestinal:  Negative for abdominal pain.  ?Musculoskeletal:  Negative for arthralgias.  ? ?Objective:  ?BP 130/83   Pulse 87   Temp 97.9 ?F (36.6 ?C)   Ht 5' 6" (1.676 m)   Wt 170 lb 9.6 oz (77.4 kg)   SpO2 98%   BMI 27.54 kg/m?  ? ?BP Readings from Last 3 Encounters:  ?05/22/21 130/83  ?02/20/21 (!) 162/100  ?11/29/20 135/85  ? ? ?Wt Readings from Last 3 Encounters:  ?05/22/21 170 lb 9.6 oz (77.4 kg)  ?02/20/21 173 lb 12.8 oz (78.8 kg)  ?11/29/20 168 lb 12.8 oz (76.6 kg)  ? ? ? ?Physical Exam ?Constitutional:   ?   General: She is not in acute distress. ?   Appearance: She is well-developed.  ?HENT:  ?   Head: Normocephalic and atraumatic.  ?Eyes:  ?   Conjunctiva/sclera: Conjunctivae normal.  ?   Pupils: Pupils are equal, round, and reactive to light.  ?Cardiovascular:  ?   Rate and Rhythm: Normal rate and regular rhythm.  ?   Heart sounds: Normal heart sounds. No murmur heard. ?Pulmonary:  ?   Effort: Pulmonary effort is normal. No respiratory distress.  ?   Breath sounds: Normal breath sounds. No wheezing or rales.  ?Abdominal:  ?   General: Bowel sounds are normal. There is no distension.  ?     Palpations: Abdomen is soft.  ?   Tenderness: There is no abdominal tenderness.  ?Musculoskeletal:     ?   General: Tenderness present.  ?   Cervical back: Normal range of motion and neck supple.  ?   Lumbar back: Spasms and tenderness present. No deformity. Decreased range of motion.  ?Skin: ?   General: Skin is warm and dry.  ?Neurological:  ?   Mental Status: She is alert and oriented to person, place, and time.  ?   Deep Tendon Reflexes: Reflexes are normal and symmetric.  ?Psychiatric:     ?   Behavior: Behavior normal.     ?   Thought  Content: Thought content normal.  ? ? ? ? ?Assessment & Plan:  ? ?Lisa Parrish was seen today for medical management of chronic issues. ? ?Diagnoses and all orders for this visit: ? ?Lumbar radiculopathy ?-     oxyCODONE-acetaminophen (PERCOCET) 5-325 MG tablet; One each morning, two each afternoon and two at bedtime ?-     oxyCODONE-acetaminophen (PERCOCET) 5-325 MG tablet; One each morning, two each afternoon and two at bedtime ?-     oxyCODONE-acetaminophen (PERCOCET) 5-325 MG tablet; One each morning, two each afternoon and two at bedtime ? ?Essential hypertension ?-     CBC with Differential/Platelet ?-     CMP14+EGFR ? ?Hyperlipidemia, unspecified hyperlipidemia type ?-     Lipid panel ? ?Opiate dependence, continuous (HCC) ?-     oxyCODONE-acetaminophen (PERCOCET) 5-325 MG tablet; One each morning, two each afternoon and two at bedtime ?-     oxyCODONE-acetaminophen (PERCOCET) 5-325 MG tablet; One each morning, two each afternoon and two at bedtime ?-     oxyCODONE-acetaminophen (PERCOCET) 5-325 MG tablet; One each morning, two each afternoon and two at bedtime ? ?Other orders ?-     pregabalin (LYRICA) 200 MG capsule; Take 2 capsules (400 mg total) by mouth at bedtime. ? ? ? ? ? ? ?I have discontinued Mayrin Sayre's Ozempic (0.25 or 0.5 MG/DOSE). I am also having her maintain her vitamin C, b complex vitamins, Polyethylene Glycol 3350 (MIRALAX PO), cyclobenzaprine, lidocaine, ibuprofen, azelastine, clobetasol cream, tiZANidine, buPROPion, chlorthalidone, diltiazem, escitalopram, pantoprazole, potassium chloride, rosuvastatin, famotidine, oxyCODONE-acetaminophen, oxyCODONE-acetaminophen, oxyCODONE-acetaminophen, and pregabalin. ? ?Allergies as of 05/22/2021   ? ?   Reactions  ? Gabapentin Anxiety  ? Intense, alarming dreams  ? Amlodipine Swelling  ? Hydralazine   ? Hydrocodone   ? Norco  ? Morphine And Related Nausea And Vomiting  ? Promethazine   ? Burns through the IV  ? Tramadol Other (See Comments)  ?  Upset stomach  ? Hydrocodone-acetaminophen Nausea Only  ? ?  ? ?  ?Medication List  ?  ? ?  ? Accurate as of May 22, 2021  9:11 PM. If you have any questions, ask your nurse or doctor.  ?  ?  ? ?  ? ?azelastine 0.05 % ophthalmic solution ?Commonly known as: OPTIVAR ?PLACE 1 DROP INTO EACH EYE ONCE DAILY AS NEEDED FOR ALLERGIES ?  ?b complex vitamins capsule ?Take 1 capsule by mouth daily. ?  ?buPROPion 300 MG 24 hr tablet ?Commonly known as: WELLBUTRIN XL ?Take 1 tablet (300 mg total) by mouth daily. 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  ?chlorthalidone 25 MG tablet ?Commonly known as: HYGROTON ?TAKE 1 TABLET BY MOUTH EVERY DAY ?  ?clobetasol cream 0.05 % ?Commonly known as: TEMOVATE ?APPLY TO AFFECTED AREA TWICE A DAY ?  ?cyclobenzaprine 5 MG tablet ?Commonly known as: FLEXERIL ?Take   5-10 mg by mouth at bedtime as needed. ?  ?diltiazem 300 MG 24 hr capsule ?Commonly known as: CARDIZEM CD ?TAKE 1 CAPSULE BY MOUTH EVERY DAY ?  ?escitalopram 10 MG tablet ?Commonly known as: LEXAPRO ?Take 1 tablet (10 mg total) by mouth daily. ?  ?famotidine 20 MG tablet ?Commonly known as: PEPCID ?TAKE 1 TABLET BY MOUTH TWICE A DAY ?  ?ibuprofen 800 MG tablet ?Commonly known as: ADVIL ?Take 1 tablet (800 mg total) by mouth 3 (three) times daily. ?  ?lidocaine 2 % solution ?Commonly known as: XYLOCAINE ?Use as directed 15 mLs in the mouth or throat as needed for mouth pain. ?  ?MIRALAX PO ?2-3 times a week ?  ?oxyCODONE-acetaminophen 5-325 MG tablet ?Commonly known as: Percocet ?One each morning, two each afternoon and two at bedtime ?Start taking on: June 07, 2021 ?What changed: These instructions start on June 07, 2021. If you are unsure what to do until then, ask your doctor or other care provider. ?Changed by: Warren Stacks, MD ?  ?oxyCODONE-acetaminophen 5-325 MG tablet ?Commonly known as: Percocet ?One each morning, two each afternoon and two at bedtime ?Start taking on: Jul 07, 2021 ?What changed: These instructions start on Jul 07, 2021. If you are  unsure what to do until then, ask your doctor or other care provider. ?Changed by: Warren Stacks, MD ?  ?oxyCODONE-acetaminophen 5-325 MG tablet ?Commonly known as: Percocet ?One each morning, two each afternoon

## 2021-05-26 ENCOUNTER — Other Ambulatory Visit: Payer: Self-pay | Admitting: Family Medicine

## 2021-07-08 ENCOUNTER — Telehealth: Payer: Self-pay | Admitting: Family Medicine

## 2021-07-09 NOTE — Telephone Encounter (Signed)
Patient aware that we had not received PA. PA started on patients behalf. ?KeyKaty Parrish - PA Case ID: [id number] Need help? Call us at [phone] ?Status ?Sent to [employer] ?Drug ?oxyCODONE-Acetaminophen 5-'325MG'$  tablets ?

## 2021-07-09 NOTE — Telephone Encounter (Signed)
Dear Lisa Parrish, ?[facility], on behalf of [employer], is responsible for reviewing pharmacy services provided to ?[employer] members. [facility] received a request on 07/09/2021 from your prescriber for ?coverage of Oxycod/Apap Tab 5-'325mg'$ . ?Your request for Oxycod/Apap Tab 5-'325mg'$  has been approved. ?How long does this approval last? ?Oxycod/Apap Tab 5-'325mg'$ , use as directed (150 per month), is approved for 1 month through 06/09/ ?2023 or until coverage for the medication is no longer available under your benefit plan or the ?medication becomes subject to a pharmacy benefit coverage requirement, such as supply limits or ?notification, whichever occurs first as allowed by law. Please note: Doses/quantities above plan limits ?and/or maximum Food and Drug Administration (FDA) approved dosing may be subject to further ?review. ?Reviewed by: [employer] ?What happens when the authorization expires? ?It is recommended that your prescriber contact [facility] for continued authorization before your ?authorization expires so that you do not experience any disruption in therapy. ?Thank you for your patience and understanding during the review process. If you have an active ?prescription for this medication, you may now fill. ?If you have any additional questions, please call [facility] toll-free at [phone] ? ? ?Pharmacy and patient aware. Patient aware to call us about 10 days before she runs out of medication for [place] to try to get another PA completed for her.  ?

## 2021-07-23 ENCOUNTER — Other Ambulatory Visit: Payer: Self-pay | Admitting: Family Medicine

## 2021-07-23 DIAGNOSIS — K219 Gastro-esophageal reflux disease without esophagitis: Secondary | ICD-10-CM

## 2021-07-26 ENCOUNTER — Telehealth: Payer: Self-pay | Admitting: Family Medicine

## 2021-08-03 ENCOUNTER — Other Ambulatory Visit: Payer: Self-pay | Admitting: Family Medicine

## 2021-08-14 NOTE — Telephone Encounter (Signed)
This was [facility] encounter

## 2021-08-26 ENCOUNTER — Encounter: Payer: Self-pay | Admitting: Family Medicine

## 2021-08-26 ENCOUNTER — Ambulatory Visit: Payer: 59 | Admitting: Family Medicine

## 2021-08-26 DIAGNOSIS — M5416 Radiculopathy, lumbar region: Secondary | ICD-10-CM

## 2021-08-26 DIAGNOSIS — F1120 Opioid dependence, uncomplicated: Secondary | ICD-10-CM | POA: Diagnosis not present

## 2021-08-26 MED ORDER — OXYCODONE-ACETAMINOPHEN 5-325 MG PO TABS: ORAL_TABLET | ORAL | 0 refills | Status: DC

## 2021-10-14 ENCOUNTER — Other Ambulatory Visit: Payer: Self-pay | Admitting: Family Medicine

## 2021-10-14 NOTE — Telephone Encounter (Signed)
Last office visit 08/26/21 Last refill 10/31/20, #[id number], 3 refills

## 2021-10-17 ENCOUNTER — Other Ambulatory Visit: Payer: Self-pay

## 2021-10-17 ENCOUNTER — Emergency Department (HOSPITAL_BASED_OUTPATIENT_CLINIC_OR_DEPARTMENT_OTHER)
Admission: EM | Admit: 2021-10-17 | Discharge: 2021-10-17 | Disposition: A | Discharge status: Home / Self Care | Payer: 59 | Attending: Emergency Medicine | Admitting: Emergency Medicine

## 2021-10-17 ENCOUNTER — Encounter (HOSPITAL_BASED_OUTPATIENT_CLINIC_OR_DEPARTMENT_OTHER): Payer: Self-pay | Admitting: Emergency Medicine

## 2021-10-17 ENCOUNTER — Emergency Department (HOSPITAL_BASED_OUTPATIENT_CLINIC_OR_DEPARTMENT_OTHER): Payer: 59 | Admitting: Radiology

## 2021-10-17 DIAGNOSIS — S63501A Unspecified sprain of right wrist, initial encounter: Secondary | ICD-10-CM | POA: Diagnosis not present

## 2021-10-17 DIAGNOSIS — I10 Essential (primary) hypertension: Secondary | ICD-10-CM | POA: Insufficient documentation

## 2021-10-17 DIAGNOSIS — Z79899 Other long term (current) drug therapy: Secondary | ICD-10-CM | POA: Diagnosis not present

## 2021-10-17 DIAGNOSIS — W010XXA Fall on same level from slipping, tripping and stumbling without subsequent striking against object, initial encounter: Secondary | ICD-10-CM | POA: Insufficient documentation

## 2021-10-17 DIAGNOSIS — S6991XA Unspecified injury of right wrist, hand and finger(s), initial encounter: Secondary | ICD-10-CM | POA: Diagnosis present

## 2021-10-17 DIAGNOSIS — Z794 Long term (current) use of insulin: Secondary | ICD-10-CM | POA: Diagnosis not present

## 2021-10-17 MED ORDER — OXYCODONE-ACETAMINOPHEN 5-325 MG PO TABS
1.0000 | ORAL_TABLET | Freq: Once | ORAL | Status: AC
  Administered 2021-10-17: 1 via ORAL
  Filled 2021-10-17: qty 1

## 2021-10-17 NOTE — Discharge Instructions (Addendum)
It was our pleasure to provide your ER care today - we hope that you feel better.  Wear splint for next few days as need for comfort and support. Icepack/cold to sore area. Take acetaminophen or ibuprofen as need.   Follow up with primary care doctor in 2-3 weeks if symptoms fail to improve/resolve. Also, your blood pressure is high today - continue meds and follow up with your doctor.   Return to ER if worse, new symptoms, new/severe pain, or other concern.   You were given pain medication in the ER - no driving for the next 6 hours.

## 2021-10-17 NOTE — ED Provider Notes (Signed)
[facility] EMERGENCY DEPT Provider Note   CSN: [id number] Arrival date & time: 10/17/21  0630     History  Chief Complaint  Patient presents with   Wrist Injury    Lisa Parrish is a 61 y.o. female.  Pt c/o fall onto right wrist yesterday with pain to wrist since. Symptoms acute onset, moderate, worse w certain movements. Is right hand dominant. Indicates mechanical fall/trip, no faintness or dizziness. Denies any other pain or injury. No headache. No neck/back pain. No numbness/weakness. Skin intact. No anticoag use.   The history is provided by the patient and medical records.  Wrist Injury Associated symptoms: no back pain, no fever and no neck pain        Home Medications Prior to Admission medications   Medication Sig Start Date End Date Taking? Authorizing Provider  Ascorbic Acid (VITAMIN C) 1000 MG tablet Take 1,000 mg by mouth daily.    [provider]  azelastine (OPTIVAR) 0.05 % ophthalmic solution PLACE 1 DROP INTO EACH EYE ONCE DAILY AS NEEDED FOR ALLERGIES 05/27/21   Claretta Fraise, MD  b complex vitamins capsule Take 1 capsule by mouth daily.    [provider]  buPROPion (WELLBUTRIN XL) 300 MG 24 hr tablet Take 1 tablet (300 mg total) by mouth daily. Dx:  [name] 02/20/21   Claretta Fraise, MD  chlorthalidone (HYGROTON) 25 MG tablet TAKE 1 TABLET BY MOUTH EVERY DAY 02/20/21   Claretta Fraise, MD  clobetasol cream (TEMOVATE) 0.05 % APPLY TO AFFECTED AREA TWICE A DAY 08/27/20   Claretta Fraise, MD  cyclobenzaprine (FLEXERIL) 5 MG tablet Take 5-10 mg by mouth at bedtime as needed. 04/20/20   [provider]  diltiazem (CARDIZEM CD) 300 MG 24 hr capsule TAKE 1 CAPSULE BY MOUTH EVERY DAY 02/20/21   Claretta Fraise, MD  escitalopram (LEXAPRO) 10 MG tablet Take 1 tablet (10 mg total) by mouth daily. 02/20/21   Claretta Fraise, MD  famotidine (PEPCID) 20 MG tablet TAKE 1 TABLET BY MOUTH TWICE A DAY 07/24/21   Claretta Fraise, MD   ibuprofen (ADVIL) 800 MG tablet Take 1 tablet (800 mg total) by mouth 3 (three) times daily. 08/23/20   Orpah Greek, MD  lidocaine (XYLOCAINE) 2 % solution Use as directed 15 mLs in the mouth or throat as needed for mouth pain. 08/23/20   Orpah Greek, MD  oxyCODONE-acetaminophen (PERCOCET) 5-325 MG tablet One each morning, two each afternoon and two at bedtime 11/04/21   Claretta Fraise, MD  oxyCODONE-acetaminophen (PERCOCET) 5-325 MG tablet One each morning, two each afternoon and two at bedtime 10/05/21   Claretta Fraise, MD  oxyCODONE-acetaminophen (PERCOCET) 5-325 MG tablet One each morning, two each afternoon and two at bedtime 09/05/21   Claretta Fraise, MD  pantoprazole (PROTONIX) 40 MG tablet Take 1 tablet (40 mg total) by mouth 2 (two) times daily before a meal. 02/20/21   Claretta Fraise, MD  Polyethylene Glycol 3350 (MIRALAX PO) 2-3 times a week 12/25/16   [provider]  potassium chloride (KLOR-CON) 10 MEQ tablet Take 1 tablet (10 mEq total) by mouth daily. 02/20/21   Claretta Fraise, MD  pregabalin (LYRICA) 200 MG capsule Take 2 capsules (400 mg total) by mouth at bedtime. 05/22/21   Claretta Fraise, MD  rosuvastatin (CRESTOR) 10 MG tablet TAKE 1 TABLET BY MOUTH DAILY. FOR CHOLESTEROL 08/05/21   Claretta Fraise, MD  Semaglutide, 1 MG/DOSE, (OZEMPIC, 1 MG/DOSE,) 4 MG/3ML SOPN INJECT '1MG'$  INTO THE SKIN ONCE A WEEK 05/22/21  Claretta Fraise, MD  tiZANidine (ZANAFLEX) 4 MG tablet TAKE 1 TABLET BY MOUTH THREE TIMES A DAY 10/14/21   Claretta Fraise, MD      Allergies    Gabapentin, Amlodipine, Hydralazine, Hydrocodone, Morphine and related, Promethazine, Tramadol, and Hydrocodone-acetaminophen    Review of Systems   Review of Systems  Constitutional:  Negative for fever.  Musculoskeletal:  Negative for back pain and neck pain.       Right wrist pain  Skin:  Negative for wound.  Neurological:  Negative for weakness, numbness and headaches.    Physical Exam Updated Vital  Signs BP (!) 158/90 (BP Location: Left Arm)   Pulse 79   Temp 98 F (36.7 C) (Oral)   Resp 15   Ht 1.676 m ('5\' 6"'$ )   Wt 79.4 kg   SpO2 99%   BMI 28.25 kg/m  Physical Exam Vitals and nursing note reviewed.  Constitutional:      Appearance: Normal appearance. She is well-developed.  HENT:     Head: Atraumatic.     Nose: Nose normal.  Eyes:     General: No scleral icterus.    Conjunctiva/sclera: Conjunctivae normal.  Neck:     Trachea: No tracheal deviation.  Cardiovascular:     Rate and Rhythm: Normal rate.     Pulses: Normal pulses.  Pulmonary:     Effort: Pulmonary effort is normal. No respiratory distress.  Musculoskeletal:     Cervical back: Neck supple. No muscular tenderness.     Comments: Mild swelling to right wrist. Tenderness distal radius dorsally. No focal scaphoid tenderness. Skin intact. No sign of infection. Radial pulse 2+.   Skin:    General: Skin is warm and dry.     Findings: No rash.  Neurological:     Mental Status: She is alert.     Comments: Alert, speech normal. Right hand nvi w intact motor/sens fxn.   Psychiatric:        Mood and Affect: Mood normal.     ED Results / Procedures / Treatments   Labs (all labs ordered are listed, but only abnormal results are displayed) Labs Reviewed - No data to display  EKG None  Radiology DG Wrist Complete Right  Result Date: 10/17/2021 CLINICAL DATA:  61 year old female with right wrist pain after fall yesterday. EXAM: RIGHT WRIST - COMPLETE 3+ VIEW COMPARISON:  None Available. FINDINGS: Bone mineralization is within normal limits for age. Joint spaces and alignment are normal for age, mild joint space loss and degenerative spurring along the ulnar aspect of the 1st [facility] joint. Small round chronic appearing 3 mm ossific fragment along the radial aspect of the trapezium. Carpal bone alignment maintained. No acute fracture or dislocation identified. IMPRESSION: No acute fracture or dislocation identified  about the right wrist. Electronically Signed   By: Genevie Ann M.D.   On: 10/17/2021 07:44    Procedures Procedures    Medications Ordered in ED Medications  oxyCODONE-acetaminophen (PERCOCET/ROXICET) 5-325 MG per tablet 1 tablet (has no administration in time range)    ED Course/ Medical Decision Making/ A&P                           Medical Decision Making Problems Addressed: Essential hypertension: chronic illness or injury that poses a threat to life or bodily functions Fall from slip, trip, or stumble, initial encounter: acute illness or injury with systemic symptoms that poses a threat to life or bodily functions  Sprain of right wrist, initial encounter: acute illness or injury  Amount and/or Complexity of Data Reviewed External Data Reviewed: notes. Radiology: ordered and independent interpretation performed. Decision-making details documented in ED Course.  Risk Prescription drug management.   Imaging ordered.   Reviewed nursing notes and prior charts for additional history.   No meds prior to arrival/today. Pt requests pain med. Percocet 1 po.   Velcro/removal wrist splint.  Xrays reviewed/interpreted by me - no fx.           Final Clinical Impression(s) / ED Diagnoses Final diagnoses:  None    Rx / DC Orders ED Discharge Orders     None         Lajean Saver, MD 10/17/21 [phone]

## 2021-10-17 NOTE — ED Triage Notes (Signed)
Pt c/o right wrist pain after falling yesterday.

## 2021-10-31 ENCOUNTER — Other Ambulatory Visit: Payer: Self-pay | Admitting: Family Medicine

## 2021-12-03 ENCOUNTER — Ambulatory Visit: Payer: 59 | Admitting: Family Medicine

## 2021-12-03 ENCOUNTER — Encounter: Payer: Self-pay | Admitting: Family Medicine

## 2021-12-03 VITALS — BP 137/88 | HR 89 | Temp 97.4°F | Ht 66.0 in | Wt 168.0 lb

## 2021-12-03 DIAGNOSIS — F32A Depression, unspecified: Secondary | ICD-10-CM | POA: Diagnosis not present

## 2021-12-03 DIAGNOSIS — I10 Essential (primary) hypertension: Secondary | ICD-10-CM

## 2021-12-03 DIAGNOSIS — F1120 Opioid dependence, uncomplicated: Secondary | ICD-10-CM

## 2021-12-03 DIAGNOSIS — K219 Gastro-esophageal reflux disease without esophagitis: Secondary | ICD-10-CM

## 2021-12-03 DIAGNOSIS — Z79899 Other long term (current) drug therapy: Secondary | ICD-10-CM

## 2021-12-03 DIAGNOSIS — I952 Hypotension due to drugs: Secondary | ICD-10-CM

## 2021-12-03 DIAGNOSIS — M5416 Radiculopathy, lumbar region: Secondary | ICD-10-CM

## 2021-12-03 MED ORDER — CHLORTHALIDONE 25 MG PO TABS: ORAL_TABLET | ORAL | 2 refills | Status: DC

## 2021-12-03 MED ORDER — BUPROPION HCL ER (XL) 300 MG PO TB24: 300.0000 mg | ORAL_TABLET | Freq: Every day | ORAL | 2 refills | Status: DC

## 2021-12-03 MED ORDER — PANTOPRAZOLE SODIUM 40 MG PO TBEC: 40.0000 mg | DELAYED_RELEASE_TABLET | Freq: Two times a day (BID) | ORAL | 2 refills | Status: DC

## 2021-12-03 MED ORDER — OXYCODONE-ACETAMINOPHEN 5-325 MG PO TABS: ORAL_TABLET | ORAL | 0 refills | Status: DC

## 2021-12-03 NOTE — Progress Notes (Signed)
Subjective:  Patient ID: Lisa Parrish, female    DOB: September 14, 1960  Age: 61 y.o. MRN: [id number]  CC: Medical Management of Chronic Issues   HPI Lisa Parrish presents for chronic lumbar radicular pain 7/10 reduced to 5-6/10 by pain md. More tolerable. Edges up after 4.5 hours  SBP dropping into the 90s. Stopped taking diltiazem. AFraid it will cause her to die. Taking ozempic, weight is stable.     12/03/2021   10:39 AM 12/03/2021   10:31 AM 08/26/2021    3:03 PM  Depression screen PHQ 2/9  Decreased Interest 0 0 0  Down, Depressed, Hopeless 0 0 0  PHQ - 2 Score 0 0 0    History Lisa Parrish has a past medical history of Allergy, Anxiety, Anxiety state (08/24/2012), Basal cell carcinoma, Chronic left SI joint pain, Constipation, Depression, GERD (gastroesophageal reflux disease), Headache, migraine, Hyperlipidemia, Hypertension, Kidney stones, Osteoarthritis, Pulmonary HTN ([place]) (12/21/2014), S/P cardiac cath 12/20/14 normal coronary arteries (12/21/2014), and Seasonal allergies.   She has a past surgical history that includes Hemorrhoid surgery (1989); Tubal ligation (1990); lumbar disckectomy (2006); Cardiac catheterization (N/A, 12/20/2014); Colonoscopy; Polypectomy; and Lumbar fusion (2018).   Her family history includes Cancer in her mother; Colon cancer in her paternal uncle; Colon cancer (age of onset: 18) in her paternal aunt and paternal uncle; Esophageal cancer in her brother; Heart disease in her father.She reports that she quit smoking about 30 years ago. Her smoking use included cigarettes. She has a 10.00 pack-year smoking history. She has never used smokeless tobacco. She reports that she does not drink alcohol and does not use drugs.    ROS Review of Systems  Constitutional: Negative.   HENT: Negative.    Eyes:  Negative for visual disturbance.  Respiratory:  Negative for shortness of breath.   Cardiovascular:  Negative for chest pain.  Gastrointestinal:   Negative for abdominal pain.  Musculoskeletal:  Negative for arthralgias.  Neurological:  Positive for dizziness (associated with hypotension). Negative for syncope and weakness.    Objective:  BP 137/88   Pulse 89   Temp (!) 97.4 F (36.3 C)   Ht '5\' 6"'$  (1.676 m)   Wt 168 lb (76.2 kg)   SpO2 96%   BMI 27.12 kg/m   BP Readings from Last 3 Encounters:  12/03/21 137/88  10/17/21 125/81  08/26/21 (!) 145/90    Wt Readings from Last 3 Encounters:  12/03/21 168 lb (76.2 kg)  10/17/21 175 lb (79.4 kg)  08/26/21 172 lb 9.6 oz (78.3 kg)     Physical Exam Constitutional:      General: She is not in acute distress.    Appearance: She is well-developed.  Cardiovascular:     Rate and Rhythm: Normal rate and regular rhythm.  Pulmonary:     Breath sounds: Normal breath sounds.  Musculoskeletal:        General: Normal range of motion.  Skin:    General: Skin is warm and dry.  Neurological:     Mental Status: She is alert and oriented to person, place, and time.       Assessment & Plan:   Duane was seen today for medical management of chronic issues.  Diagnoses and all orders for this visit:  Controlled substance agreement signed -     ToxASSURE Select 13 (MW), Urine  Depression, unspecified depression type -     buPROPion (WELLBUTRIN XL) 300 MG 24 hr tablet; Take 1 tablet (300 mg total) by mouth daily. Dx:  F32.A  Essential hypertension -     Discontinue: chlorthalidone (HYGROTON) 25 MG tablet; TAKE 1 TABLET BY MOUTH EVERY DAY  Lumbar radiculopathy -     oxyCODONE-acetaminophen (PERCOCET) 5-325 MG tablet; One each morning, two each afternoon and two at bedtime -     oxyCODONE-acetaminophen (PERCOCET) 5-325 MG tablet; One each morning, two each afternoon and two at bedtime -     oxyCODONE-acetaminophen (PERCOCET) 5-325 MG tablet; One each morning, two each afternoon and two at bedtime  Opiate dependence, continuous (HCC) -     oxyCODONE-acetaminophen (PERCOCET)  5-325 MG tablet; One each morning, two each afternoon and two at bedtime -     oxyCODONE-acetaminophen (PERCOCET) 5-325 MG tablet; One each morning, two each afternoon and two at bedtime -     oxyCODONE-acetaminophen (PERCOCET) 5-325 MG tablet; One each morning, two each afternoon and two at bedtime  Gastroesophageal reflux disease without esophagitis -     pantoprazole (PROTONIX) 40 MG tablet; Take 1 tablet (40 mg total) by mouth 2 (two) times daily before a meal.  Hypotension due to drugs       I have discontinued Lisa Parrish's chlorthalidone, diltiazem, escitalopram, rosuvastatin, and chlorthalidone. I am also having her maintain her vitamin C, b complex vitamins, Polyethylene Glycol 3350 (MIRALAX PO), cyclobenzaprine, lidocaine, ibuprofen, clobetasol cream, potassium chloride, Ozempic (1 MG/DOSE), pregabalin, azelastine, famotidine, tiZANidine, buPROPion, oxyCODONE-acetaminophen, oxyCODONE-acetaminophen, oxyCODONE-acetaminophen, and pantoprazole.  Allergies as of 12/03/2021       Reactions   Gabapentin Anxiety   Intense, alarming dreams   Amlodipine Swelling   Hydralazine    Hydrocodone    Norco   Morphine And Related Nausea And Vomiting   Promethazine    Burns through the IV   Tramadol Other (See Comments)   Upset stomach   Hydrocodone-acetaminophen Nausea Only        Medication List        Accurate as of December 03, 2021 11:12 AM. If you have any questions, ask your nurse or doctor.          STOP taking these medications    chlorthalidone 25 MG tablet Commonly known as: HYGROTON Stopped by: Claretta Fraise, MD   diltiazem 300 MG 24 hr capsule Commonly known as: CARDIZEM CD Stopped by: Claretta Fraise, MD   escitalopram 10 MG tablet Commonly known as: LEXAPRO Stopped by: Claretta Fraise, MD   rosuvastatin 10 MG tablet Commonly known as: CRESTOR Stopped by: Claretta Fraise, MD       TAKE these medications    azelastine 0.05 % ophthalmic  solution Commonly known as: OPTIVAR PLACE 1 DROP INTO EACH EYE ONCE DAILY AS NEEDED FOR ALLERGIES   b complex vitamins capsule Take 1 capsule by mouth daily.   buPROPion 300 MG 24 hr tablet Commonly known as: WELLBUTRIN XL Take 1 tablet (300 mg total) by mouth daily. Dx:  [name]   clobetasol cream 0.05 % Commonly known as: TEMOVATE APPLY TO AFFECTED AREA TWICE A DAY   cyclobenzaprine 5 MG tablet Commonly known as: FLEXERIL Take 5-10 mg by mouth at bedtime as needed.   famotidine 20 MG tablet Commonly known as: PEPCID TAKE 1 TABLET BY MOUTH TWICE A DAY   ibuprofen 800 MG tablet Commonly known as: ADVIL Take 1 tablet (800 mg total) by mouth 3 (three) times daily.   lidocaine 2 % solution Commonly known as: XYLOCAINE Use as directed 15 mLs in the mouth or throat as needed for mouth pain.   MIRALAX PO 2-3 times a  week   oxyCODONE-acetaminophen 5-325 MG tablet Commonly known as: Percocet One each morning, two each afternoon and two at bedtime What changed: Another medication with the same name was changed. Make sure you understand how and when to take each. Changed by: Claretta Fraise, MD   oxyCODONE-acetaminophen 5-325 MG tablet Commonly known as: Percocet One each morning, two each afternoon and two at bedtime Start taking on: January 03, 2022 What changed: These instructions start on January 03, 2022. If you are unsure what to do until then, ask your doctor or other care provider. Changed by: Claretta Fraise, MD   oxyCODONE-acetaminophen 5-325 MG tablet Commonly known as: Percocet One each morning, two each afternoon and two at bedtime Start taking on: February 02, 2022 What changed: These instructions start on February 02, 2022. If you are unsure what to do until then, ask your doctor or other care provider. Changed by: Claretta Fraise, MD   Ozempic (1 MG/DOSE) 4 MG/3ML Sopn Generic drug: Semaglutide (1 MG/DOSE) INJECT '1MG'$  INTO THE SKIN ONCE A WEEK   pantoprazole 40 MG  tablet Commonly known as: PROTONIX Take 1 tablet (40 mg total) by mouth 2 (two) times daily before a meal.   potassium chloride 10 MEQ tablet Commonly known as: KLOR-CON Take 1 tablet (10 mEq total) by mouth daily.   pregabalin 200 MG capsule Commonly known as: LYRICA Take 2 capsules (400 mg total) by mouth at bedtime.   tiZANidine 4 MG tablet Commonly known as: ZANAFLEX TAKE 1 TABLET BY MOUTH THREE TIMES A DAY   vitamin C 1000 MG tablet Take 1,000 mg by mouth daily.       DCed BP mds due to pt. Concern. She will continue to monitor for BP > 130/80 at home, at rest  Follow-up: Return in about 3 months (around 03/05/2022), or if symptoms worsen or fail to improve.  Claretta Fraise, M.D.

## 2021-12-05 LAB — MED LIST OPTION NOT SELECTED

## 2021-12-06 LAB — TOXASSURE SELECT 13 (MW), URINE

## 2022-01-25 ENCOUNTER — Other Ambulatory Visit: Payer: Self-pay | Admitting: Family Medicine

## 2022-01-29 ENCOUNTER — Other Ambulatory Visit: Payer: Self-pay | Admitting: Family Medicine

## 2022-02-20 ENCOUNTER — Encounter: Payer: Self-pay | Admitting: Family Medicine

## 2022-02-20 ENCOUNTER — Ambulatory Visit: Payer: 59 | Admitting: Family Medicine

## 2022-02-20 ENCOUNTER — Ambulatory Visit (INDEPENDENT_AMBULATORY_CARE_PROVIDER_SITE_OTHER): Payer: 59 | Admitting: Family Medicine

## 2022-02-20 VITALS — BP 179/100 | HR 85 | Temp 97.8°F | Ht 66.0 in | Wt 169.8 lb

## 2022-02-20 DIAGNOSIS — G8929 Other chronic pain: Secondary | ICD-10-CM

## 2022-02-20 DIAGNOSIS — I10 Essential (primary) hypertension: Secondary | ICD-10-CM | POA: Diagnosis not present

## 2022-02-20 DIAGNOSIS — M5416 Radiculopathy, lumbar region: Secondary | ICD-10-CM

## 2022-02-20 DIAGNOSIS — M5442 Lumbago with sciatica, left side: Secondary | ICD-10-CM | POA: Diagnosis not present

## 2022-02-20 DIAGNOSIS — I952 Hypotension due to drugs: Secondary | ICD-10-CM

## 2022-02-20 DIAGNOSIS — F1120 Opioid dependence, uncomplicated: Secondary | ICD-10-CM

## 2022-02-20 DIAGNOSIS — Z0000 Encounter for general adult medical examination without abnormal findings: Secondary | ICD-10-CM

## 2022-02-20 DIAGNOSIS — Z0001 Encounter for general adult medical examination with abnormal findings: Secondary | ICD-10-CM

## 2022-02-20 DIAGNOSIS — K219 Gastro-esophageal reflux disease without esophagitis: Secondary | ICD-10-CM

## 2022-02-20 MED ORDER — OXYCODONE-ACETAMINOPHEN 5-325 MG PO TABS: ORAL_TABLET | ORAL | 0 refills | Status: DC

## 2022-02-20 NOTE — Addendum Note (Signed)
Addended by: Claretta Fraise on: 02/20/2022 05:17 PM   Modules accepted: Orders

## 2022-02-20 NOTE — Progress Notes (Addendum)
Subjective:  Patient ID: Lisa Parrish, female    DOB: 1961/01/21  Age: 61 y.o. MRN: [id number]  CC: Annual Exam   HPI Lisa Parrish presents for Annual exam with pain assessment. Back pain still severe. Doesn't want any more surgery because the last didn't help. Nearly time for refills of oxycodone.  Blood pressure is high today because she does not have any of her pain medicine today and it has been a very hectic day additionally.   Follow-up of hypertension. Patient has no history of headache chest pain or shortness of breath or recent cough. Patient also denies symptoms of TIA such as numbness weakness lateralizing. Patient checks  blood pressure at home and has not had any elevated readings recently. Patient denies side effects from  medication. States taking it regularly.  Patient in for follow-up of GERD. Currently asymptomatic taking  PPI daily. There is no chest pain or heartburn. No hematemesis and no melena. No dysphagia or choking. Onset is remote. Progression is stable. Complicating factors, none.      12/03/2021   10:39 AM 12/03/2021   10:31 AM 08/26/2021    3:03 PM  Depression screen PHQ 2/9  Decreased Interest 0 0 0  Down, Depressed, Hopeless 0 0 0  PHQ - 2 Score 0 0 0    History Lisa Parrish has a past medical history of Allergy, Anxiety, Anxiety state (08/24/2012), Basal cell carcinoma, Chronic left SI joint pain, Constipation, Depression, GERD (gastroesophageal reflux disease), Headache, migraine, Hyperlipidemia, Hypertension, Kidney stones, Osteoarthritis, Pulmonary HTN ([place]) (12/21/2014), S/P cardiac cath 12/20/14 normal coronary arteries (12/21/2014), and Seasonal allergies.   She has a past surgical history that includes Hemorrhoid surgery (1989); Tubal ligation (1990); lumbar disckectomy (2006); Cardiac catheterization (N/A, 12/20/2014); Colonoscopy; Polypectomy; and Lumbar fusion (2018).   Her family history includes Cancer in her mother; Colon cancer in her  paternal uncle; Colon cancer (age of onset: 5) in her paternal aunt and paternal uncle; Esophageal cancer in her brother; Heart disease in her father.She reports that she quit smoking about 30 years ago. Her smoking use included cigarettes. She has a 10.00 pack-year smoking history. She has never used smokeless tobacco. She reports that she does not drink alcohol and does not use drugs.    ROS Review of Systems  Constitutional:  Negative for appetite change, chills, diaphoresis, fatigue, fever and unexpected weight change.  HENT:  Negative for congestion, ear pain, hearing loss, postnasal drip, rhinorrhea, sneezing, sore throat and trouble swallowing.   Eyes:  Negative for pain.  Respiratory:  Negative for cough, chest tightness and shortness of breath.   Cardiovascular:  Negative for chest pain and palpitations.  Gastrointestinal:  Negative for abdominal pain, constipation, diarrhea, nausea and vomiting.  Endocrine: Negative for cold intolerance, heat intolerance, polydipsia, polyphagia and polyuria.  Genitourinary:  Negative for dysuria, frequency and menstrual problem.  Musculoskeletal:  Negative for arthralgias and joint swelling.  Skin:  Negative for rash.  Allergic/Immunologic: Negative for environmental allergies.  Neurological:  Negative for dizziness, weakness, numbness and headaches.  Psychiatric/Behavioral:  Negative for agitation and dysphoric mood.     Objective:  BP (!) 179/100   Pulse 85   Temp 97.8 F (36.6 C)   Ht _0  (1.676 m)   Wt 169 lb 12.8 oz (77 kg)   SpO2 97%   BMI 27.41 kg/m   BP Readings from Last 3 Encounters:  02/20/22 (!) 179/100  12/03/21 137/88  10/17/21 125/81    Wt Readings from Last 3 Encounters:  02/20/22 169 lb 12.8 oz (77 kg)  12/03/21 168 lb (76.2 kg)  10/17/21 175 lb (79.4 kg)     Physical Exam Constitutional:      General: She is not in acute distress.    Appearance: She is well-developed.  HENT:     Head: Normocephalic and  atraumatic.  Eyes:     Conjunctiva/sclera: Conjunctivae normal.     Pupils: Pupils are equal, round, and reactive to light.  Neck:     Thyroid: No thyromegaly.  Cardiovascular:     Rate and Rhythm: Normal rate and regular rhythm.     Heart sounds: Normal heart sounds. No murmur heard. Pulmonary:     Effort: Pulmonary effort is normal. No respiratory distress.     Breath sounds: Normal breath sounds. No wheezing or rales.  Abdominal:     General: Bowel sounds are normal. There is no distension.     Palpations: Abdomen is soft.     Tenderness: There is no abdominal tenderness.  Musculoskeletal:        General: Normal range of motion.     Cervical back: Normal range of motion and neck supple.  Lymphadenopathy:     Cervical: No cervical adenopathy.  Skin:    General: Skin is warm and dry.  Neurological:     Mental Status: She is alert and oriented to person, place, and time.  Psychiatric:        Behavior: Behavior normal.        Thought Content: Thought content normal.        Judgment: Judgment normal.       Assessment & Plan:   Lisa Parrish was seen today for annual exam.  Diagnoses and all orders for this visit:  Well adult exam -     CBC with Differential/Platelet -     CMP14+EGFR -     Lipid panel -     Urinalysis  Essential hypertension -     CBC with Differential/Platelet -     CMP14+EGFR  Hypotension due to drugs -     Lipid panel  Chronic bilateral low back pain with left-sided sciatica  Opiate dependence, continuous (HCC) -     oxyCODONE-acetaminophen (PERCOCET) 5-325 MG tablet; One each morning, two each afternoon and two at bedtime -     oxyCODONE-acetaminophen (PERCOCET) 5-325 MG tablet; One each morning, two each afternoon and two at bedtime -     oxyCODONE-acetaminophen (PERCOCET) 5-325 MG tablet; One each morning, two each afternoon and two at bedtime  Gastroesophageal reflux disease without esophagitis  Lumbar radiculopathy -      oxyCODONE-acetaminophen (PERCOCET) 5-325 MG tablet; One each morning, two each afternoon and two at bedtime -     oxyCODONE-acetaminophen (PERCOCET) 5-325 MG tablet; One each morning, two each afternoon and two at bedtime -     oxyCODONE-acetaminophen (PERCOCET) 5-325 MG tablet; One each morning, two each afternoon and two at bedtime       I am having Arleene Settle maintain her vitamin C, b complex vitamins, Polyethylene Glycol 3350 (MIRALAX PO), cyclobenzaprine, lidocaine, ibuprofen, clobetasol cream, Ozempic (1 MG/DOSE), pregabalin, azelastine, famotidine, tiZANidine, buPROPion, pantoprazole, potassium chloride, oxyCODONE-acetaminophen, oxyCODONE-acetaminophen, and oxyCODONE-acetaminophen.  Allergies as of 02/20/2022       Reactions   Gabapentin Anxiety   Intense, alarming dreams   Amlodipine Swelling   Hydralazine    Hydrocodone    Norco   Morphine And Related Nausea And Vomiting   Promethazine    Burns through the IV  Tramadol Other (See Comments)   Upset stomach   Hydrocodone-acetaminophen Nausea Only        Medication List        Accurate as of February 20, 2022  5:17 PM. If you have any questions, ask your nurse or doctor.          azelastine 0.05 % ophthalmic solution Commonly known as: OPTIVAR PLACE 1 DROP INTO EACH EYE ONCE DAILY AS NEEDED FOR ALLERGIES   b complex vitamins capsule Take 1 capsule by mouth daily.   buPROPion 300 MG 24 hr tablet Commonly known as: WELLBUTRIN XL Take 1 tablet (300 mg total) by mouth daily. Dx:  [name]   clobetasol cream 0.05 % Commonly known as: TEMOVATE APPLY TO AFFECTED AREA TWICE A DAY   cyclobenzaprine 5 MG tablet Commonly known as: FLEXERIL Take 5-10 mg by mouth at bedtime as needed.   famotidine 20 MG tablet Commonly known as: PEPCID TAKE 1 TABLET BY MOUTH TWICE A DAY   ibuprofen 800 MG tablet Commonly known as: ADVIL Take 1 tablet (800 mg total) by mouth 3 (three) times daily.   lidocaine 2 %  solution Commonly known as: XYLOCAINE Use as directed 15 mLs in the mouth or throat as needed for mouth pain.   MIRALAX PO 2-3 times a week   oxyCODONE-acetaminophen 5-325 MG tablet Commonly known as: Percocet One each morning, two each afternoon and two at bedtime Start taking on: March 01, 2022 What changed: These instructions start on March 01, 2022. If you are unsure what to do until then, ask your doctor or other care provider. Changed by: Claretta Fraise, MD   oxyCODONE-acetaminophen 5-325 MG tablet Commonly known as: Percocet One each morning, two each afternoon and two at bedtime Start taking on: April 02, 2022 What changed: These instructions start on April 02, 2022. If you are unsure what to do until then, ask your doctor or other care provider. Changed by: Claretta Fraise, MD   oxyCODONE-acetaminophen 5-325 MG tablet Commonly known as: Percocet One each morning, two each afternoon and two at bedtime Start taking on: May 02, 2022 What changed: These instructions start on May 02, 2022. If you are unsure what to do until then, ask your doctor or other care provider. Changed by: Claretta Fraise, MD   Ozempic (1 MG/DOSE) 4 MG/3ML Sopn Generic drug: Semaglutide (1 MG/DOSE) INJECT 1MG INTO THE SKIN ONCE A WEEK   pantoprazole 40 MG tablet Commonly known as: PROTONIX Take 1 tablet (40 mg total) by mouth 2 (two) times daily before a meal.   potassium chloride 10 MEQ tablet Commonly known as: KLOR-CON TAKE 1 TABLET BY MOUTH EVERY DAY   pregabalin 200 MG capsule Commonly known as: LYRICA Take 2 capsules (400 mg total) by mouth at bedtime.   tiZANidine 4 MG tablet Commonly known as: ZANAFLEX TAKE 1 TABLET BY MOUTH THREE TIMES A DAY   vitamin C 1000 MG tablet Take 1,000 mg by mouth daily.         Follow-up: Return in about 3 months (around 06/02/2022) for Pain.  Claretta Fraise, M.D.

## 2022-02-21 ENCOUNTER — Other Ambulatory Visit: Payer: 59

## 2022-02-21 LAB — URINALYSIS
Bilirubin, UA: NEGATIVE
Glucose, UA: NEGATIVE
Ketones, UA: NEGATIVE
Leukocytes,UA: NEGATIVE
Nitrite, UA: NEGATIVE
Protein,UA: NEGATIVE
RBC, UA: NEGATIVE
Specific Gravity, UA: 1.02 (ref 1.005–1.030)
Urobilinogen, Ur: 1 mg/dL (ref 0.2–1.0)
pH, UA: 6.5 (ref 5.0–7.5)

## 2022-02-21 LAB — CMP14+EGFR
ALT: 15 IU/L (ref 0–32)
AST: 15 IU/L (ref 0–40)
Albumin/Globulin Ratio: 2.5 — ABNORMAL HIGH (ref 1.2–2.2)
Albumin: 4.5 g/dL (ref 3.9–4.9)
Alkaline Phosphatase: 76 IU/L (ref 44–121)
BUN/Creatinine Ratio: 14 (ref 12–28)
BUN: 12 mg/dL (ref 8–27)
Bilirubin Total: 0.4 mg/dL (ref 0.0–1.2)
CO2: 22 mmol/L (ref 20–29)
Calcium: 9.1 mg/dL (ref 8.7–10.3)
Chloride: 103 mmol/L (ref 96–106)
Creatinine, Ser: 0.84 mg/dL (ref 0.57–1.00)
Globulin, Total: 1.8 g/dL (ref 1.5–4.5)
Glucose: 85 mg/dL (ref 70–99)
Potassium: 3.9 mmol/L (ref 3.5–5.2)
Sodium: 141 mmol/L (ref 134–144)
Total Protein: 6.3 g/dL (ref 6.0–8.5)
eGFR: 79 mL/min/{1.73_m2} (ref >59)

## 2022-02-21 LAB — CBC WITH DIFFERENTIAL/PLATELET
Basophils Absolute: 0 10*3/uL (ref 0.0–0.2)
Basos: 1 %
EOS (ABSOLUTE): 0.2 10*3/uL (ref 0.0–0.4)
Eos: 5 %
Hematocrit: 36.8 % (ref 34.0–46.6)
Hemoglobin: 11.9 g/dL (ref 11.1–15.9)
Immature Grans (Abs): 0 10*3/uL (ref 0.0–0.1)
Immature Granulocytes: 0 %
Lymphocytes Absolute: 1.6 10*3/uL (ref 0.7–3.1)
Lymphs: 42 %
MCH: 28.7 pg (ref 26.6–33.0)
MCHC: 32.3 g/dL (ref 31.5–35.7)
MCV: 89 fL (ref 79–97)
Monocytes Absolute: 0.3 10*3/uL (ref 0.1–0.9)
Monocytes: 9 %
Neutrophils Absolute: 1.6 10*3/uL (ref 1.4–7.0)
Neutrophils: 43 %
Platelets: 186 10*3/uL (ref 150–450)
RBC: 4.14 x10E6/uL (ref 3.77–5.28)
RDW: 13.3 % (ref 11.7–15.4)
WBC: 3.7 10*3/uL (ref 3.4–10.8)

## 2022-02-21 LAB — LIPID PANEL
Chol/HDL Ratio: 4 ratio (ref 0.0–4.4)
Cholesterol, Total: 234 mg/dL — ABNORMAL HIGH (ref 100–199)
HDL: 59 mg/dL (ref >39)
LDL Chol Calc (NIH): 154 mg/dL — ABNORMAL HIGH (ref 0–99)
Triglycerides: 118 mg/dL (ref 0–149)
VLDL Cholesterol Cal: 21 mg/dL (ref 5–40)

## 2022-02-26 ENCOUNTER — Other Ambulatory Visit: Payer: Self-pay | Admitting: Family Medicine

## 2022-02-26 ENCOUNTER — Telehealth: Payer: Self-pay | Admitting: Family Medicine

## 2022-02-26 NOTE — Telephone Encounter (Signed)
  Prescription Request  02/26/2022  Is this a "Controlled Substance" medicine? yes  Have you seen your PCP in the last 2 weeks? Yes 02/20/2022 by Dr. Livia Snellen and none of her medications were refilled  If YES, route message to pool  -  If NO, patient needs to be scheduled for appointment.  What is the name of the medication or equipment?  pregabalin (LYRICA) 200 MG capsule  buPROPion (WELLBUTRIN XL) 300 MG 24 hr tablet  pantoprazole (PROTONIX) 40 MG tablet  potassium chloride (KLOR-CON) 10 MEQ tablet   Have you contacted your pharmacy to request a refill? yes   Which pharmacy would you like this sent to? [facility]    Patient notified that their request is being sent to the clinical staff for review and that they should receive a response within 2 business days.

## 2022-03-03 ENCOUNTER — Other Ambulatory Visit: Payer: Self-pay | Admitting: Family Medicine

## 2022-03-03 DIAGNOSIS — K219 Gastro-esophageal reflux disease without esophagitis: Secondary | ICD-10-CM

## 2022-03-03 MED ORDER — PANTOPRAZOLE SODIUM 40 MG PO TBEC: 40.0000 mg | DELAYED_RELEASE_TABLET | Freq: Two times a day (BID) | ORAL | 2 refills | Status: DC

## 2022-03-03 MED ORDER — POTASSIUM CHLORIDE ER 10 MEQ PO TBCR: 10.0000 meq | EXTENDED_RELEASE_TABLET | Freq: Every day | ORAL | 0 refills | Status: DC

## 2022-03-03 MED ORDER — PREGABALIN 200 MG PO CAPS: 400.0000 mg | ORAL_CAPSULE | Freq: Every day | ORAL | 3 refills | Status: DC

## 2022-03-03 NOTE — Telephone Encounter (Signed)
The oxycodone was sent in. There were refills left on everything else. However, I went ahead and re-sent them  (except the oxycodone)

## 2022-03-04 ENCOUNTER — Ambulatory Visit: Payer: 59 | Admitting: Family Medicine

## 2022-03-04 NOTE — Telephone Encounter (Signed)
PT R/C 

## 2022-03-04 NOTE — Telephone Encounter (Signed)
Pt aware rx's sent in and voiced understanding.

## 2022-03-05 MED ORDER — ROSUVASTATIN CALCIUM 5 MG PO TABS: 5.0000 mg | ORAL_TABLET | Freq: Every day | ORAL | 3 refills | Status: DC

## 2022-03-05 NOTE — Addendum Note (Signed)
Addended by: Claretta Fraise on: 03/05/2022 06:12 PM   Modules accepted: Orders

## 2022-04-21 ENCOUNTER — Other Ambulatory Visit: Payer: Self-pay | Admitting: Family Medicine

## 2022-05-06 ENCOUNTER — Other Ambulatory Visit: Payer: Self-pay | Admitting: Family Medicine

## 2022-05-19 ENCOUNTER — Telehealth: Payer: Self-pay | Admitting: Family Medicine

## 2022-05-19 NOTE — Telephone Encounter (Signed)
Patient said that she needs a PA started for  Semaglutide, 1 MG/DOSE, (OZEMPIC, 1 MG/DOSE,) 4 MG/3ML SOPN

## 2022-05-20 ENCOUNTER — Other Ambulatory Visit (HOSPITAL_COMMUNITY): Payer: Self-pay

## 2022-05-29 ENCOUNTER — Other Ambulatory Visit (HOSPITAL_COMMUNITY): Payer: Self-pay

## 2022-05-29 ENCOUNTER — Encounter: Payer: Self-pay | Admitting: Family Medicine

## 2022-05-29 DIAGNOSIS — E663 Overweight: Secondary | ICD-10-CM | POA: Insufficient documentation

## 2022-05-29 DIAGNOSIS — M961 Postlaminectomy syndrome, not elsewhere classified: Secondary | ICD-10-CM | POA: Insufficient documentation

## 2022-06-02 ENCOUNTER — Telehealth: Payer: Self-pay | Admitting: Family Medicine

## 2022-06-02 ENCOUNTER — Ambulatory Visit: Payer: 59 | Admitting: Family Medicine

## 2022-06-02 NOTE — Telephone Encounter (Signed)
If Dr. Livia Snellen is back tomorrow then please have him respond to this message.,  If not then we can make an appointment for who is ever covering for him or with the same-day person to at least be assessed and get enough through the next appointment but they can set up with Dr. Livia Snellen.

## 2022-06-02 NOTE — Telephone Encounter (Signed)
  Prescription Request  06/02/2022  Is this a "Controlled Substance" medicine? yes  Have you seen your PCP in the last 2 weeks? yes  If YES, route message to pool  -  If NO, patient needs to be scheduled for appointment.  What is the name of the medication or equipment? Oxycodone 5-325 mg - Patient has appt 4-10 with [facility]. Had to reschedule appt due to [facility] being out sick and she is out of her pain meds. Needs some called in. Can't go without until appt. Needs today  Have you contacted your pharmacy to request a refill? no   [facility] [place]   Patient notified that their request is being sent to the clinical staff for review and that they should receive a response within 2 business days.

## 2022-06-02 NOTE — Telephone Encounter (Signed)
Patient is calling back to discuss her pain medicine and asked for a manager.  I spoke with her and she is upset that she is unable to get her oxycodone because her appointments had to be canceled because Dr. Livia Snellen was out.  She is very upset that she has to be out of her medication when it is not her fault.  Is there no way she could have enough called in until her appointment.  [facility]  Please call.

## 2022-06-03 ENCOUNTER — Ambulatory Visit: Payer: 59 | Admitting: Family Medicine

## 2022-06-03 NOTE — Telephone Encounter (Signed)
Appointment scheduled for 04/03 with [facility].

## 2022-06-03 NOTE — Telephone Encounter (Signed)
Patient called requesting to be worked in to see Dr Livia Snellen tomorrow, 06/04/22, to get refill on her oxycodone. Says she has been out since Sunday and can't go much longer without it. Can nurse work her in to see Dr Livia Snellen?

## 2022-06-04 ENCOUNTER — Encounter: Payer: Self-pay | Admitting: Family Medicine

## 2022-06-04 ENCOUNTER — Ambulatory Visit: Payer: 59 | Admitting: Family Medicine

## 2022-06-04 DIAGNOSIS — F1120 Opioid dependence, uncomplicated: Secondary | ICD-10-CM | POA: Diagnosis not present

## 2022-06-04 DIAGNOSIS — F32A Depression, unspecified: Secondary | ICD-10-CM

## 2022-06-04 DIAGNOSIS — M5416 Radiculopathy, lumbar region: Secondary | ICD-10-CM

## 2022-06-04 DIAGNOSIS — K219 Gastro-esophageal reflux disease without esophagitis: Secondary | ICD-10-CM

## 2022-06-04 MED ORDER — CYCLOBENZAPRINE HCL 5 MG PO TABS: 5.0000 mg | ORAL_TABLET | Freq: Every evening | ORAL | 5 refills | Status: DC | PRN

## 2022-06-04 MED ORDER — OZEMPIC (1 MG/DOSE) 4 MG/3ML ~~LOC~~ SOPN: PEN_INJECTOR | SUBCUTANEOUS | 0 refills | Status: DC

## 2022-06-04 MED ORDER — FAMOTIDINE 20 MG PO TABS: 20.0000 mg | ORAL_TABLET | Freq: Two times a day (BID) | ORAL | 3 refills | Status: DC

## 2022-06-04 MED ORDER — OXYCODONE-ACETAMINOPHEN 5-325 MG PO TABS: ORAL_TABLET | ORAL | 0 refills | Status: DC

## 2022-06-04 MED ORDER — VALSARTAN-HYDROCHLOROTHIAZIDE 160-12.5 MG PO TABS: 1.0000 | ORAL_TABLET | Freq: Every day | ORAL | 3 refills | Status: DC

## 2022-06-04 MED ORDER — POTASSIUM CHLORIDE ER 10 MEQ PO TBCR: 10.0000 meq | EXTENDED_RELEASE_TABLET | Freq: Every day | ORAL | 1 refills | Status: DC

## 2022-06-04 MED ORDER — BUPROPION HCL ER (XL) 300 MG PO TB24: 300.0000 mg | ORAL_TABLET | Freq: Every day | ORAL | 2 refills | Status: DC

## 2022-06-04 NOTE — Progress Notes (Signed)
Subjective:  Patient ID: Lisa Parrish, female    DOB: [birth year]  Age: 62 y.o. MRN: [id number]  CC: Medical Management of Chronic Issues   HPI Tanner Rothman presents for ongoing treatment of lumbar radiculopathy. Taking opiates with moderate reduction of pain. PDMP review shows appropriate utilization of the patient's controlled medications.Uses pregabalin to mitigated pose of pain med. Now using 37.5 MME daily.   Patient in for follow-up of GERD. Currently asymptomatic taking  PPI daily. There is no chest pain or heartburn. No hematemesis and no melena. No dysphagia or choking. Onset is remote. Progression is stable. Complicating factors, none.   in for follow-up of elevated cholesterol. Doing well without complaints on current medication. Denies side effects of statin including myalgia and arthralgia and nausea. Currently no chest pain, shortness of breath or other cardiovascular related symptoms noted.   presents for  follow-up of hypertension. Patient has no history of headache chest pain or shortness of breath or recent cough. Patient also denies symptoms of TIA such as focal numbness or weakness. Patient denies side effects from medication. States taking it regularly.       06/04/2022    3:56 PM 06/04/2022    3:40 PM 12/03/2021   10:39 AM  Depression screen PHQ 2/9  Decreased Interest 0 0 0  Down, Depressed, Hopeless 0 0 0  PHQ - 2 Score 0 0 0  Altered sleeping 1    Tired, decreased energy 1    Change in appetite 0    Feeling bad or failure about yourself  0    Trouble concentrating 0    Moving slowly or fidgety/restless 0    Suicidal thoughts 0    PHQ-9 Score 2    Difficult doing work/chores Somewhat difficult      History Shariah has a past medical history of Allergy, Anxiety, Anxiety state (08/24/2012), Basal cell carcinoma, Chronic left SI joint pain, Constipation, Depression, GERD (gastroesophageal reflux disease), Headache, migraine, Hyperlipidemia,  Hypertension, Kidney stones, Osteoarthritis, Pulmonary HTN (12/21/2014), S/P cardiac cath 12/20/14 normal coronary arteries (12/21/2014), and Seasonal allergies.   She has a past surgical history that includes Hemorrhoid surgery (1989); Tubal ligation (1990); lumbar disckectomy (2006); Cardiac catheterization (N/A, 12/20/2014); Colonoscopy; Polypectomy; and Lumbar fusion (2018).   Her family history includes Cancer in her mother; Colon cancer in her paternal uncle; Colon cancer (age of onset: 69) in her paternal aunt and paternal uncle; Esophageal cancer in her brother; Heart disease in her father.She reports that she quit smoking about 30 years ago. Her smoking use included cigarettes. She has a 10.00 pack-year smoking history. She has never used smokeless tobacco. She reports that she does not drink alcohol and does not use drugs.    ROS Review of Systems  Constitutional: Negative.   HENT: Negative.    Eyes:  Negative for visual disturbance.  Respiratory:  Negative for shortness of breath.   Cardiovascular:  Negative for chest pain.  Gastrointestinal:  Negative for abdominal pain.  Musculoskeletal:  Negative for arthralgias.    Objective:  BP (!) 191/111   Pulse (!) 105   Temp 98.2 F (36.8 C)   Ht 5\' 6"  (1.676 m)   Wt 165 lb 3.2 oz (74.9 kg)   SpO2 96%   BMI 26.66 kg/m   BP Readings from Last 3 Encounters:  06/04/22 (!) 191/111  02/20/22 (!) 179/100  12/03/21 137/88    Wt Readings from Last 3 Encounters:  06/04/22 165 lb 3.2 oz (74.9 kg)  02/20/22 169  lb 12.8 oz (77 kg)  12/03/21 168 lb (76.2 kg)     Physical Exam Constitutional:      General: She is not in acute distress.    Appearance: She is well-developed.  Cardiovascular:     Rate and Rhythm: Normal rate and regular rhythm.  Pulmonary:     Breath sounds: Normal breath sounds.  Musculoskeletal:        General: Normal range of motion.  Skin:    General: Skin is warm and dry.  Neurological:     Mental  Status: She is alert and oriented to person, place, and time.       Assessment & Plan:   Areyonna was seen today for medical management of chronic issues.  Diagnoses and all orders for this visit:  Lumbar radiculopathy -     oxyCODONE-acetaminophen (PERCOCET) 5-325 MG tablet; One each morning, two each afternoon and two at bedtime -     oxyCODONE-acetaminophen (PERCOCET) 5-325 MG tablet; One each morning, two each afternoon and two at bedtime -     oxyCODONE-acetaminophen (PERCOCET) 5-325 MG tablet; One each morning, two each afternoon and two at bedtime  Opiate dependence, continuous -     oxyCODONE-acetaminophen (PERCOCET) 5-325 MG tablet; One each morning, two each afternoon and two at bedtime -     oxyCODONE-acetaminophen (PERCOCET) 5-325 MG tablet; One each morning, two each afternoon and two at bedtime -     oxyCODONE-acetaminophen (PERCOCET) 5-325 MG tablet; One each morning, two each afternoon and two at bedtime  Depression, unspecified depression type -     buPROPion (WELLBUTRIN XL) 300 MG 24 hr tablet; Take 1 tablet (300 mg total) by mouth daily. Dx:  Ata.Sales.A  Gastroesophageal reflux disease without esophagitis -     famotidine (PEPCID) 20 MG tablet; Take 1 tablet (20 mg total) by mouth 2 (two) times daily.  Other orders -     potassium chloride (KLOR-CON) 10 MEQ tablet; Take 1 tablet (10 mEq total) by mouth daily. -     cyclobenzaprine (FLEXERIL) 5 MG tablet; Take 1 tablet (5 mg total) by mouth at bedtime as needed (headache). -     Semaglutide, 1 MG/DOSE, (OZEMPIC, 1 MG/DOSE,) 4 MG/3ML SOPN; INJECT 1 MG INTO THE SKIN ONE TIME PER WEEK -     valsartan-hydrochlorothiazide (DIOVAN-HCT) 160-12.5 MG tablet; Take 1 tablet by mouth daily.       I have changed Ashleylynn Harter-[name]'s cyclobenzaprine and famotidine. I am also having her start on valsartan-hydrochlorothiazide. Additionally, I am having her maintain her vitamin C, b complex vitamins, Polyethylene Glycol 3350 (MIRALAX  PO), lidocaine, ibuprofen, clobetasol cream, tiZANidine, pregabalin, pantoprazole, rosuvastatin, azelastine, oxyCODONE-acetaminophen, oxyCODONE-acetaminophen, oxyCODONE-acetaminophen, potassium chloride, buPROPion, and Ozempic (1 MG/DOSE).  Allergies as of 06/04/2022       Reactions   Gabapentin Anxiety   Intense, alarming dreams   Amlodipine Swelling   Hydralazine    Hydrocodone    Norco   Morphine And Related Nausea And Vomiting   Promethazine    Burns through the IV   Tramadol Other (See Comments)   Upset stomach   Hydrocodone-acetaminophen Nausea Only        Medication List        Accurate as of June 04, 2022 11:59 PM. If you have any questions, ask your nurse or doctor.          azelastine 0.05 % ophthalmic solution Commonly known as: OPTIVAR PLACE 1 DROP INTO EACH EYE ONCE DAILY AS NEEDED FOR ALLERGIES  b complex vitamins capsule Take 1 capsule by mouth daily.   buPROPion 300 MG 24 hr tablet Commonly known as: WELLBUTRIN XL Take 1 tablet (300 mg total) by mouth daily. Dx:  Ata.Sales.A   clobetasol cream 0.05 % Commonly known as: TEMOVATE APPLY TO AFFECTED AREA TWICE A DAY   cyclobenzaprine 5 MG tablet Commonly known as: FLEXERIL Take 1 tablet (5 mg total) by mouth at bedtime as needed (headache). What changed:  how much to take reasons to take this Changed by: Mechele Claude, MD   famotidine 20 MG tablet Commonly known as: PEPCID Take 1 tablet (20 mg total) by mouth 2 (two) times daily.   ibuprofen 800 MG tablet Commonly known as: ADVIL Take 1 tablet (800 mg total) by mouth 3 (three) times daily.   lidocaine 2 % solution Commonly known as: XYLOCAINE Use as directed 15 mLs in the mouth or throat as needed for mouth pain.   MIRALAX PO 2-3 times a week   oxyCODONE-acetaminophen 5-325 MG tablet Commonly known as: Percocet One each morning, two each afternoon and two at bedtime What changed: Another medication with the same name was changed. Make sure  you understand how and when to take each. Changed by: Mechele Claude, MD   oxyCODONE-acetaminophen 5-325 MG tablet Commonly known as: Percocet One each morning, two each afternoon and two at bedtime Start taking on: Jul 04, 2022 What changed: These instructions start on Jul 04, 2022. If you are unsure what to do until then, ask your doctor or other care provider. Changed by: Mechele Claude, MD   oxyCODONE-acetaminophen 5-325 MG tablet Commonly known as: Percocet One each morning, two each afternoon and two at bedtime Start taking on: August 03, 2022 What changed: These instructions start on August 03, 2022. If you are unsure what to do until then, ask your doctor or other care provider. Changed by: Mechele Claude, MD   Ozempic (1 MG/DOSE) 4 MG/3ML Sopn Generic drug: Semaglutide (1 MG/DOSE) INJECT 1 MG INTO THE SKIN ONE TIME PER WEEK   pantoprazole 40 MG tablet Commonly known as: PROTONIX Take 1 tablet (40 mg total) by mouth 2 (two) times daily before a meal.   potassium chloride 10 MEQ tablet Commonly known as: KLOR-CON Take 1 tablet (10 mEq total) by mouth daily.   pregabalin 200 MG capsule Commonly known as: LYRICA Take 2 capsules (400 mg total) by mouth at bedtime.   rosuvastatin 5 MG tablet Commonly known as: Crestor Take 1 tablet (5 mg total) by mouth daily. For cholesterol   tiZANidine 4 MG tablet Commonly known as: ZANAFLEX TAKE 1 TABLET BY MOUTH THREE TIMES A DAY   valsartan-hydrochlorothiazide 160-12.5 MG tablet Commonly known as: DIOVAN-HCT Take 1 tablet by mouth daily. Started by: Mechele Claude, MD   vitamin C 1000 MG tablet Take 1,000 mg by mouth daily.         Follow-up: Return in 1 month (on 07/04/2022).  Mechele Claude, M.D.

## 2022-06-07 ENCOUNTER — Encounter: Payer: Self-pay | Admitting: Family Medicine

## 2022-06-11 ENCOUNTER — Other Ambulatory Visit: Payer: Self-pay | Admitting: Family Medicine

## 2022-06-11 ENCOUNTER — Ambulatory Visit: Payer: 59 | Admitting: Family Medicine

## 2022-06-11 MED ORDER — SEMAGLUTIDE-WEIGHT MANAGEMENT 1 MG/0.5ML ~~LOC~~ SOAJ: 1.0000 mg | SUBCUTANEOUS | 5 refills | Status: DC

## 2022-06-11 NOTE — Telephone Encounter (Signed)
FDA only allows Ozempic for use in treating type 2 diabetes mellitus. Please change medication if appropriate.

## 2022-06-11 NOTE — Telephone Encounter (Signed)
WEGOVY 1 MG/0.5ML SOAJ   Pharmacy comment: Alternative Requested:NOT COVERED BY INSURANCE.

## 2022-06-12 ENCOUNTER — Other Ambulatory Visit: Payer: Self-pay | Admitting: Family Medicine

## 2022-06-12 NOTE — Telephone Encounter (Signed)
Name from pharmacy: WEGOVY 1 MG/0.5 ML PEN   Pharmacy comment: Alternative Requested:PRODUCT NOT COVERED BY INSURANCE PLAN; PLEASE CONTACT INSURANCE OR CONSIDER ALTERNATIVE.

## 2022-06-13 ENCOUNTER — Ambulatory Visit: Payer: 59 | Admitting: *Deleted

## 2022-06-13 DIAGNOSIS — I959 Hypotension, unspecified: Secondary | ICD-10-CM

## 2022-06-13 MED ORDER — VALSARTAN-HYDROCHLOROTHIAZIDE 80-12.5 MG PO TABS: 1.0000 | ORAL_TABLET | Freq: Every day | ORAL | 0 refills | Status: DC

## 2022-06-13 NOTE — Progress Notes (Signed)
Pt called in earlier that her BP did not register on her machine at home. At her last visit on 06/04/22 w/ her PCP it was high and she was started on Valsartan/HCTZ. Today on our machine it was 97/58 p 74 w/ her machine it was 89/59 p 83. She is having lots of dizziness, peripheral vision is dark and a little bit of headache.  Verbal orders per Kari Baars, FNP Valsartan/HCTZ 80-12.5 [facility] [place]. To take BP before taking Tizanidine. To also take BP before taking Valsartan/HCTZ, if BP still low to hold BP med. To make a follow up appt w/ PCP. Pt has appt next week w/ PCP.

## 2022-06-24 ENCOUNTER — Ambulatory Visit: Payer: 59 | Admitting: Family Medicine

## 2022-06-24 ENCOUNTER — Encounter: Payer: Self-pay | Admitting: Family Medicine

## 2022-06-24 VITALS — BP 142/90 | HR 96 | Temp 97.9°F | Ht 66.0 in | Wt 166.4 lb

## 2022-06-24 DIAGNOSIS — E782 Mixed hyperlipidemia: Secondary | ICD-10-CM

## 2022-06-24 DIAGNOSIS — I10 Essential (primary) hypertension: Secondary | ICD-10-CM | POA: Diagnosis not present

## 2022-06-24 DIAGNOSIS — M961 Postlaminectomy syndrome, not elsewhere classified: Secondary | ICD-10-CM | POA: Diagnosis not present

## 2022-06-24 MED ORDER — CYCLOBENZAPRINE HCL 5 MG PO TABS: 5.0000 mg | ORAL_TABLET | Freq: Three times a day (TID) | ORAL | 1 refills | Status: DC

## 2022-06-24 NOTE — Progress Notes (Signed)
Subjective:  Patient ID: Lisa Parrish, female    DOB: 03-04-1960  Age: 62 y.o. MRN: [id number]  CC: Hypertension   HPI Lisa Parrish presents for  follow-up of hypertension. Patient has no history of headache chest pain or shortness of breath or recent cough. Patient also denies symptoms of TIA such as focal numbness or weakness. Pt. Reported very low BP  and was decreased to  Diovan 80/12.5 2 weeks ago. Brings in readings showing variable BP from 115 to 140s systolic. Of note is that for her failed back syndrome she takes tizanidine regularly which can lower BP.   History Lisa Parrish has a past medical history of Allergy, Anxiety, Anxiety state (08/24/2012), Basal cell carcinoma, Chronic left SI joint pain, Constipation, Depression, GERD (gastroesophageal reflux disease), Headache, migraine, Hyperlipidemia, Hypertension, Kidney stones, Osteoarthritis, Pulmonary HTN (12/21/2014), S/P cardiac cath 12/20/14 normal coronary arteries (12/21/2014), and Seasonal allergies.   She has a past surgical history that includes Hemorrhoid surgery (1989); Tubal ligation (1990); lumbar disckectomy (2006); Cardiac catheterization (N/A, 12/20/2014); Colonoscopy; Polypectomy; and Lumbar fusion (2018).   Her family history includes Cancer in her mother; Colon cancer in her paternal uncle; Colon cancer (age of onset: 27) in her paternal aunt and paternal uncle; Esophageal cancer in her brother; Heart disease in her father.She reports that she quit smoking about 30 years ago. Her smoking use included cigarettes. She has a 10.00 pack-year smoking history. She has never used smokeless tobacco. She reports that she does not drink alcohol and does not use drugs.  Current Outpatient Medications on File Prior to Visit  Medication Sig Dispense Refill   Ascorbic Acid (VITAMIN C) 1000 MG tablet Take 1,000 mg by mouth daily.     azelastine (OPTIVAR) 0.05 % ophthalmic solution PLACE 1 DROP INTO EACH EYE ONCE DAILY AS  NEEDED FOR ALLERGIES 18 mL 1   b complex vitamins capsule Take 1 capsule by mouth daily.     buPROPion (WELLBUTRIN XL) 300 MG 24 hr tablet Take 1 tablet (300 mg total) by mouth daily. Dx:  Ata.Sales.A 120 tablet 2   clobetasol cream (TEMOVATE) 0.05 % APPLY TO AFFECTED AREA TWICE A DAY 60 g 3   famotidine (PEPCID) 20 MG tablet Take 1 tablet (20 mg total) by mouth 2 (two) times daily. 180 tablet 3   ibuprofen (ADVIL) 800 MG tablet Take 1 tablet (800 mg total) by mouth 3 (three) times daily. 21 tablet 0   lidocaine (XYLOCAINE) 2 % solution Use as directed 15 mLs in the mouth or throat as needed for mouth pain. 200 mL 0   oxyCODONE-acetaminophen (PERCOCET) 5-325 MG tablet One each morning, two each afternoon and two at bedtime 150 tablet 0   [START ON 07/04/2022] oxyCODONE-acetaminophen (PERCOCET) 5-325 MG tablet One each morning, two each afternoon and two at bedtime 150 tablet 0   [START ON 08/03/2022] oxyCODONE-acetaminophen (PERCOCET) 5-325 MG tablet One each morning, two each afternoon and two at bedtime 150 tablet 0   pantoprazole (PROTONIX) 40 MG tablet Take 1 tablet (40 mg total) by mouth 2 (two) times daily before a meal. 180 tablet 2   Polyethylene Glycol 3350 (MIRALAX PO) 2-3 times a week     potassium chloride (KLOR-CON) 10 MEQ tablet Take 1 tablet (10 mEq total) by mouth daily. 90 tablet 1   pregabalin (LYRICA) 200 MG capsule Take 2 capsules (400 mg total) by mouth at bedtime. 180 capsule 3   rosuvastatin (CRESTOR) 5 MG tablet Take 1 tablet (5 mg total) by  mouth daily. For cholesterol 90 tablet 3   Semaglutide-Weight Management (WEGOVY) 1 MG/0.5ML SOAJ INJECT 1 MG INTO THE SKIN ONE TIME PER WEEK 6 mL 3   valsartan-hydrochlorothiazide (DIOVAN-HCT) 80-12.5 MG tablet Take 1 tablet by mouth daily. 90 tablet 0   No current facility-administered medications on file prior to visit.    ROS Review of Systems  Constitutional: Negative.   HENT: Negative.    Eyes:  Negative for visual disturbance.   Respiratory:  Negative for shortness of breath.   Cardiovascular:  Negative for chest pain.  Gastrointestinal:  Negative for abdominal pain.  Musculoskeletal:  Negative for arthralgias.    Objective:  BP (!) 142/90   Pulse 96   Temp 97.9 F (36.6 C)   Ht  (1.676 m)   Wt 166 lb 6.4 oz (75.5 kg)   SpO2 96%   BMI 26.86 kg/m   BP Readings from Last 3 Encounters:  06/24/22 (!) 142/90  06/04/22 (!) 191/111  02/20/22 (!) 179/100    Wt Readings from Last 3 Encounters:  06/24/22 166 lb 6.4 oz (75.5 kg)  06/04/22 165 lb 3.2 oz (74.9 kg)  02/20/22 169 lb 12.8 oz (77 kg)     Physical Exam Constitutional:      General: She is not in acute distress.    Appearance: She is well-developed.  Cardiovascular:     Rate and Rhythm: Normal rate and regular rhythm.  Pulmonary:     Breath sounds: Normal breath sounds.  Musculoskeletal:        General: Normal range of motion.  Skin:    General: Skin is warm and dry.  Neurological:     Mental Status: She is alert and oriented to person, place, and time.       Assessment & Plan:   Lisa Parrish was seen today for hypertension.  Diagnoses and all orders for this visit:  Primary hypertension -     CMP14+EGFR  Mixed hyperlipidemia -     Lipid panel -     CMP14+EGFR  Failed back syndrome of lumbar spine  Other orders -     cyclobenzaprine (FLEXERIL) 5 MG tablet; Take 1 tablet (5 mg total) by mouth 3 (three) times daily.   Allergies as of 06/24/2022       Reactions   Gabapentin Anxiety   Intense, alarming dreams   Amlodipine Swelling   Hydralazine    Hydrocodone    Norco   Morphine And Related Nausea And Vomiting   Promethazine    Burns through the IV   Tramadol Other (See Comments)   Upset stomach   Hydrocodone-acetaminophen Nausea Only        Medication List        Accurate as of June 24, 2022  6:24 PM. If you have any questions, ask your nurse or doctor.          STOP taking these medications     tiZANidine 4 MG tablet Commonly known as: ZANAFLEX Stopped by: Mechele Claude, MD       TAKE these medications    azelastine 0.05 % ophthalmic solution Commonly known as: OPTIVAR PLACE 1 DROP INTO EACH EYE ONCE DAILY AS NEEDED FOR ALLERGIES   b complex vitamins capsule Take 1 capsule by mouth daily.   buPROPion 300 MG 24 hr tablet Commonly known as: WELLBUTRIN XL Take 1 tablet (300 mg total) by mouth daily. Dx:  Ata.Sales.A   clobetasol cream 0.05 % Commonly known as: TEMOVATE APPLY TO AFFECTED AREA  TWICE A DAY   cyclobenzaprine 5 MG tablet Commonly known as: FLEXERIL Take 1 tablet (5 mg total) by mouth 3 (three) times daily. What changed:  when to take this reasons to take this Changed by: Mechele Claude, MD   famotidine 20 MG tablet Commonly known as: PEPCID Take 1 tablet (20 mg total) by mouth 2 (two) times daily.   ibuprofen 800 MG tablet Commonly known as: ADVIL Take 1 tablet (800 mg total) by mouth 3 (three) times daily.   lidocaine 2 % solution Commonly known as: XYLOCAINE Use as directed 15 mLs in the mouth or throat as needed for mouth pain.   MIRALAX PO 2-3 times a week   oxyCODONE-acetaminophen 5-325 MG tablet Commonly known as: Percocet One each morning, two each afternoon and two at bedtime   oxyCODONE-acetaminophen 5-325 MG tablet Commonly known as: Percocet One each morning, two each afternoon and two at bedtime Start taking on: Jul 04, 2022   oxyCODONE-acetaminophen 5-325 MG tablet Commonly known as: Percocet One each morning, two each afternoon and two at bedtime Start taking on: August 03, 2022   pantoprazole 40 MG tablet Commonly known as: PROTONIX Take 1 tablet (40 mg total) by mouth 2 (two) times daily before a meal.   potassium chloride 10 MEQ tablet Commonly known as: KLOR-CON Take 1 tablet (10 mEq total) by mouth daily.   pregabalin 200 MG capsule Commonly known as: LYRICA Take 2 capsules (400 mg total) by mouth at bedtime.    rosuvastatin 5 MG tablet Commonly known as: Crestor Take 1 tablet (5 mg total) by mouth daily. For cholesterol   valsartan-hydrochlorothiazide 80-12.5 MG tablet Commonly known as: DIOVAN-HCT Take 1 tablet by mouth daily.   vitamin C 1000 MG tablet Take 1,000 mg by mouth daily.   Wegovy 1 MG/0.5ML Soaj Generic drug: Semaglutide-Weight Management INJECT 1 MG INTO THE SKIN ONE TIME PER WEEK        Meds ordered this encounter  Medications   cyclobenzaprine (FLEXERIL) 5 MG tablet    Sig: Take 1 tablet (5 mg total) by mouth 3 (three) times daily.    Dispense:  90 tablet    Refill:  1      Follow-up: Return in about 2 weeks (around 07/08/2022).  Mechele Claude, M.D.

## 2022-06-25 ENCOUNTER — Other Ambulatory Visit: Payer: 59

## 2022-06-25 IMAGING — DX DG CHEST 2V
2 series · 2 of 2 positions shown · non-contrast
Comparison: 02/18/2016

CLINICAL DATA: Chest pain

EXAM:
CHEST - 2 VIEW

[chest pa]
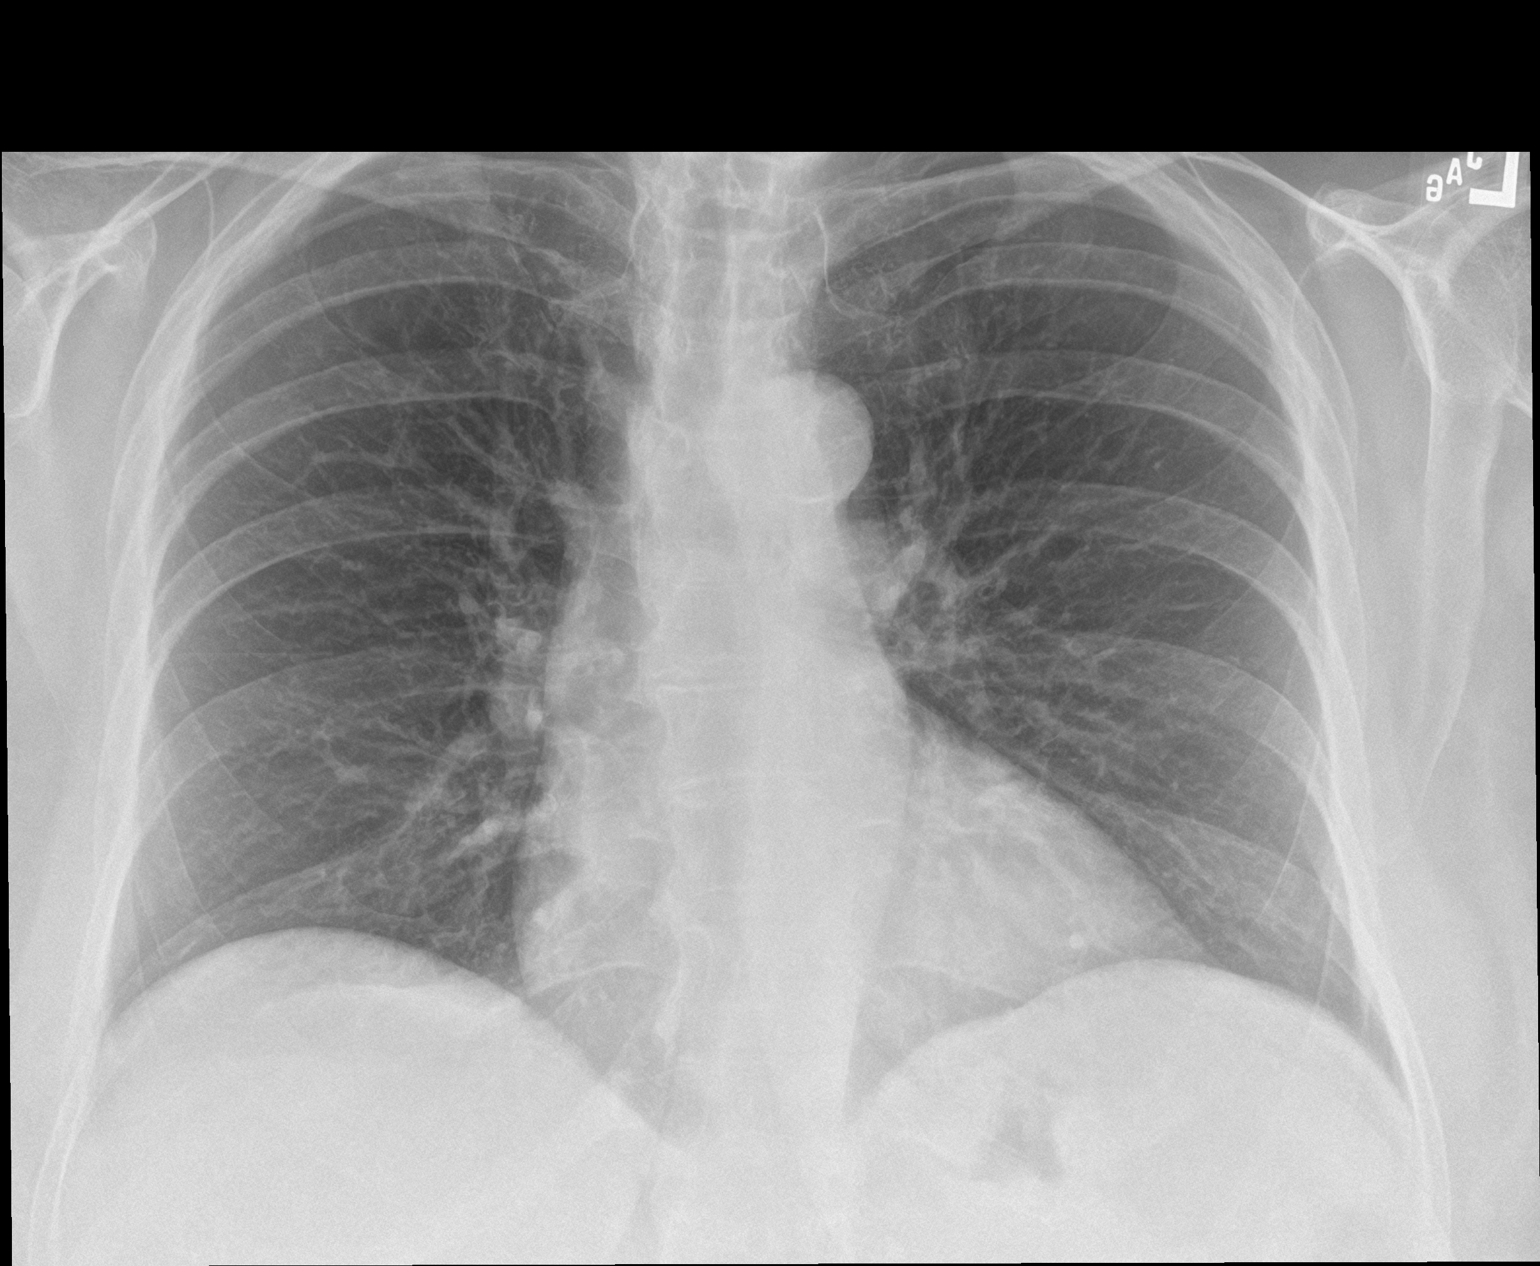

[chest lat]
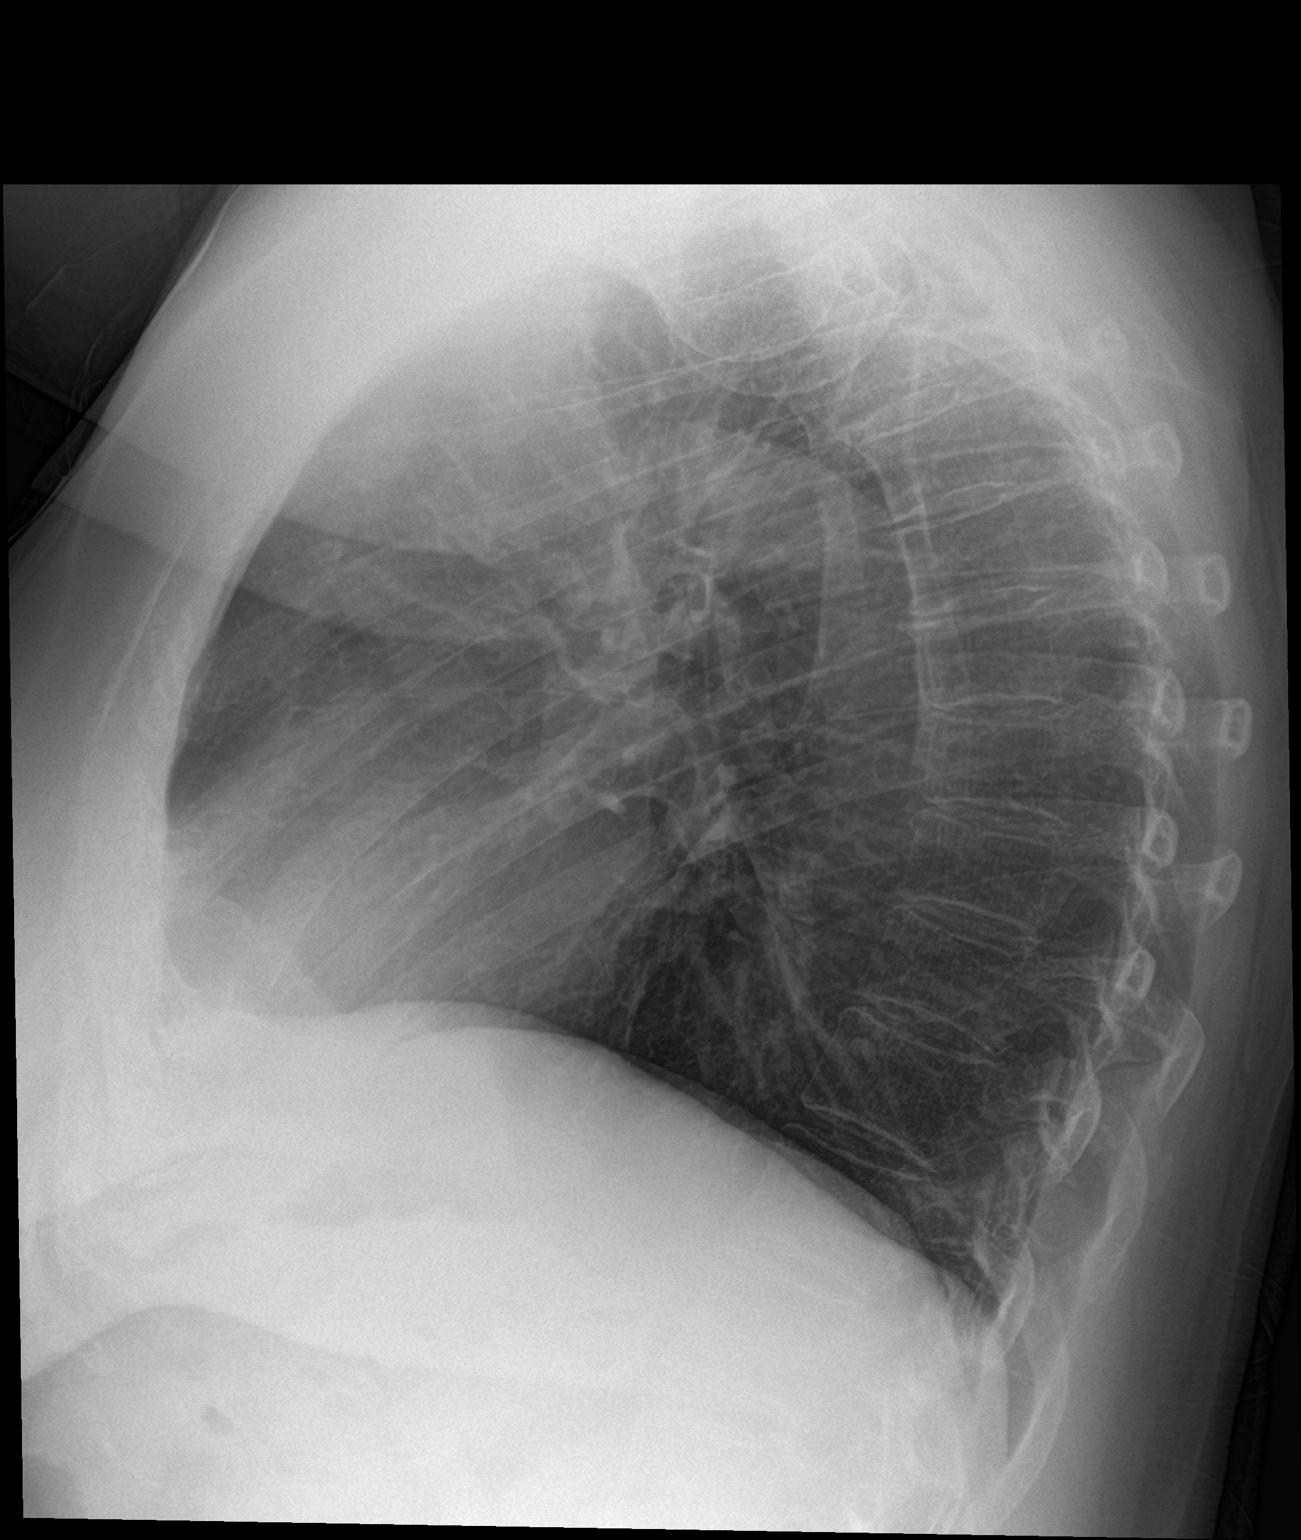

[2 of 2 positions shown; findings below may reference images not displayed]

FINDINGS: The heart size and mediastinal contours are within normal limits.
Aortic atherosclerosis. Both lungs are clear. The visualized
skeletal structures are unremarkable.
IMPRESSION: No active cardiopulmonary disease.

## 2022-06-26 ENCOUNTER — Other Ambulatory Visit: Payer: 59

## 2022-06-26 LAB — LIPID PANEL

## 2022-06-26 LAB — CMP14+EGFR: Calcium: 9.7 mg/dL (ref 8.7–10.3)

## 2022-06-27 LAB — LIPID PANEL
Cholesterol, Total: 168 mg/dL (ref 100–199)
HDL: 52 mg/dL (ref >39)
LDL Chol Calc (NIH): 95 mg/dL (ref 0–99)

## 2022-06-27 LAB — CMP14+EGFR
ALT: 13 IU/L (ref 0–32)
AST: 18 IU/L (ref 0–40)
Albumin/Globulin Ratio: 2.1 (ref 1.2–2.2)
Albumin: 4.8 g/dL (ref 3.9–4.9)
Alkaline Phosphatase: 93 IU/L (ref 44–121)
BUN: 27 mg/dL (ref 8–27)
Bilirubin Total: 0.4 mg/dL (ref 0.0–1.2)
CO2: 21 mmol/L (ref 20–29)
Chloride: 103 mmol/L (ref 96–106)
Glucose: 90 mg/dL (ref 70–99)
Total Protein: 7.1 g/dL (ref 6.0–8.5)
eGFR: 79 mL/min/{1.73_m2} (ref >59)

## 2022-07-01 NOTE — Progress Notes (Signed)
Hello [place],  Your lab result is normal and/or stable.Some minor variations that are not significant are commonly marked abnormal, but do not represent any medical problem for you.  Best regards, Starsky Nanna, M.D.

## 2022-07-09 ENCOUNTER — Ambulatory Visit: Payer: 59 | Admitting: Family Medicine

## 2022-08-25 ENCOUNTER — Encounter: Payer: Self-pay | Admitting: Family Medicine

## 2022-08-25 ENCOUNTER — Ambulatory Visit: Payer: 59 | Admitting: Family Medicine

## 2022-08-25 VITALS — BP 138/86 | HR 94 | Temp 97.8°F | Ht 66.0 in | Wt 173.2 lb

## 2022-08-25 DIAGNOSIS — E782 Mixed hyperlipidemia: Secondary | ICD-10-CM | POA: Diagnosis not present

## 2022-08-25 DIAGNOSIS — M5416 Radiculopathy, lumbar region: Secondary | ICD-10-CM | POA: Diagnosis not present

## 2022-08-25 DIAGNOSIS — I10 Essential (primary) hypertension: Secondary | ICD-10-CM | POA: Diagnosis not present

## 2022-08-25 DIAGNOSIS — R300 Dysuria: Secondary | ICD-10-CM | POA: Insufficient documentation

## 2022-08-25 DIAGNOSIS — F1120 Opioid dependence, uncomplicated: Secondary | ICD-10-CM | POA: Diagnosis not present

## 2022-08-25 LAB — CBC WITH DIFFERENTIAL/PLATELET
Basophils Absolute: 0 10*3/uL (ref 0.0–0.2)
Basos: 1 %
EOS (ABSOLUTE): 0.2 10*3/uL (ref 0.0–0.4)
Eos: 4 %
Hematocrit: 37.1 % (ref 34.0–46.6)
Hemoglobin: 12.5 g/dL (ref 11.1–15.9)
Immature Grans (Abs): 0 10*3/uL (ref 0.0–0.1)
Immature Granulocytes: 0 %
Lymphocytes Absolute: 1.2 10*3/uL (ref 0.7–3.1)
Lymphs: 24 %
MCH: 30.3 pg (ref 26.6–33.0)
MCHC: 33.7 g/dL (ref 31.5–35.7)
MCV: 90 fL (ref 79–97)
Monocytes Absolute: 0.4 10*3/uL (ref 0.1–0.9)
Monocytes: 8 %
Neutrophils Absolute: 3.2 10*3/uL (ref 1.4–7.0)
Neutrophils: 63 %
Platelets: 215 10*3/uL (ref 150–450)
RBC: 4.12 x10E6/uL (ref 3.77–5.28)
RDW: 12.8 % (ref 11.7–15.4)
WBC: 5.1 10*3/uL (ref 3.4–10.8)

## 2022-08-25 LAB — URINALYSIS
Bilirubin, UA: NEGATIVE
Glucose, UA: NEGATIVE
Ketones, UA: NEGATIVE
Leukocytes,UA: NEGATIVE
Nitrite, UA: NEGATIVE
Protein,UA: NEGATIVE
RBC, UA: NEGATIVE
Specific Gravity, UA: <1.005 — ABNORMAL LOW (ref 1.005–1.030)
Urobilinogen, Ur: 0.2 mg/dL (ref 0.2–1.0)
pH, UA: 6 (ref 5.0–7.5)

## 2022-08-25 LAB — CMP14+EGFR
ALT: 16 IU/L (ref 0–32)
AST: 20 IU/L (ref 0–40)
Albumin: 4.7 g/dL (ref 3.9–4.9)
Alkaline Phosphatase: 103 IU/L (ref 44–121)
BUN/Creatinine Ratio: 21 (ref 12–28)
BUN: 14 mg/dL (ref 8–27)
Bilirubin Total: 0.4 mg/dL (ref 0.0–1.2)
CO2: 24 mmol/L (ref 20–29)
Calcium: 9.7 mg/dL (ref 8.7–10.3)
Chloride: 100 mmol/L (ref 96–106)
Creatinine, Ser: 0.67 mg/dL (ref 0.57–1.00)
Globulin, Total: 2.4 g/dL (ref 1.5–4.5)
Glucose: 85 mg/dL (ref 70–99)
Potassium: 4.4 mmol/L (ref 3.5–5.2)
Sodium: 140 mmol/L (ref 134–144)
Total Protein: 7.1 g/dL (ref 6.0–8.5)
eGFR: 99 mL/min/{1.73_m2} (ref >59)

## 2022-08-25 LAB — LIPID PANEL
Chol/HDL Ratio: 3.2 ratio (ref 0.0–4.4)
Cholesterol, Total: 177 mg/dL (ref 100–199)
HDL: 55 mg/dL (ref >39)
LDL Chol Calc (NIH): 87 mg/dL (ref 0–99)
Triglycerides: 206 mg/dL — ABNORMAL HIGH (ref 0–149)
VLDL Cholesterol Cal: 35 mg/dL (ref 5–40)

## 2022-08-25 MED ORDER — OXYCODONE HCL 10 MG PO TABS
10.0000 mg | ORAL_TABLET | Freq: Three times a day (TID) | ORAL | 0 refills | Status: DC
Start: 2022-10-24 — End: 2023-05-18

## 2022-08-25 MED ORDER — VALSARTAN-HYDROCHLOROTHIAZIDE 80-12.5 MG PO TABS
1.0000 | ORAL_TABLET | Freq: Every day | ORAL | 3 refills | Status: DC
Start: 2022-08-25 — End: 2022-11-17

## 2022-08-25 MED ORDER — OXYCODONE HCL 10 MG PO TABS
10.0000 mg | ORAL_TABLET | Freq: Three times a day (TID) | ORAL | 0 refills | Status: DC
Start: 2022-08-25 — End: 2023-04-23

## 2022-08-25 MED ORDER — OXYCODONE HCL 10 MG PO TABS
10.0000 mg | ORAL_TABLET | Freq: Three times a day (TID) | ORAL | 0 refills | Status: DC
Start: 2022-09-24 — End: 2023-05-18

## 2022-08-25 MED ORDER — PREGABALIN 200 MG PO CAPS: 400.0000 mg | ORAL_CAPSULE | Freq: Every day | ORAL | 3 refills | Status: DC

## 2022-08-25 NOTE — Progress Notes (Signed)
Subjective:  Patient ID: Lisa Parrish, female    DOB: Feb 02, 1961  Age: 62 y.o. MRN: [id number]  CC: No chief complaint on file.   HPI Lisa Parrish presents for [employer] on [employer]. Severe pain since. Now at left buttocks cheek and posterolateral left leg just above ankle. Requesting referral to neurology. Percocet not giving relief. PDMP review shows appropriate utilization of the patient's controlled medications. Now 7-8/10   When urinating, wipes then has to go again. Has an odor. Onset several weeks ago.     08/25/2022    9:06 AM 06/24/2022    1:09 PM 06/24/2022   12:58 PM  Depression screen PHQ 2/9  Decreased Interest 0 0 0  Down, Depressed, Hopeless 0 1 0  PHQ - 2 Score 0 1 0  Altered sleeping 0 0   Tired, decreased energy 1 1   Change in appetite 0 0   Feeling bad or failure about yourself  0 0   Trouble concentrating 0 0   Moving slowly or fidgety/restless 0 0   Suicidal thoughts 0 0   PHQ-9 Score 1 2   Difficult doing work/chores Somewhat difficult Somewhat difficult     History Lisa Parrish has a past medical history of Allergy, Anxiety, Anxiety state (08/24/2012), Basal cell carcinoma, Chronic left SI joint pain, Constipation, Depression, GERD (gastroesophageal reflux disease), Headache, migraine, Hyperlipidemia, Hypertension, Kidney stones, Osteoarthritis, Pulmonary HTN (HCC) (12/21/2014), S/P cardiac cath 12/20/14 normal coronary arteries (12/21/2014), and Seasonal allergies.   She has a past surgical history that includes Hemorrhoid surgery (1989); Tubal ligation (1990); lumbar disckectomy (2006); Cardiac catheterization (N/A, 12/20/2014); Colonoscopy; Polypectomy; and Lumbar fusion (2018).   Her family history includes Cancer in her mother; Colon cancer in her paternal uncle; Colon cancer (age of onset: 66) in her paternal aunt and paternal uncle; Esophageal cancer in her brother; Heart disease in her father.She reports that she quit smoking about 30 years ago.  Her smoking use included cigarettes. She has a 10.00 pack-year smoking history. She has never used smokeless tobacco. She reports that she does not drink alcohol and does not use drugs.    ROS Review of Systems  Constitutional: Negative.   HENT: Negative.    Eyes:  Negative for visual disturbance.  Respiratory:  Negative for shortness of breath.   Cardiovascular:  Negative for chest pain.  Gastrointestinal:  Negative for abdominal pain.  Musculoskeletal:  Negative for arthralgias.    Objective:  BP 138/86   Pulse 94   Temp 97.8 F (36.6 C)   Ht 5\' 6"  (1.676 m)   Wt 173 lb 3.2 oz (78.6 kg)   SpO2 97%   BMI 27.96 kg/m   BP Readings from Last 3 Encounters:  08/25/22 138/86  06/24/22 (!) 142/90  06/04/22 (!) 191/111    Wt Readings from Last 3 Encounters:  08/25/22 173 lb 3.2 oz (78.6 kg)  06/24/22 166 lb 6.4 oz (75.5 kg)  06/04/22 165 lb 3.2 oz (74.9 kg)     Physical Exam Constitutional:      General: She is not in acute distress.    Appearance: She is well-developed.  Cardiovascular:     Rate and Rhythm: Normal rate and regular rhythm.  Pulmonary:     Breath sounds: Normal breath sounds.  Musculoskeletal:        General: Tenderness (low back, left buttock and thigh) present. Normal range of motion.  Skin:    General: Skin is warm and dry.  Neurological:  Mental Status: She is alert and oriented to person, place, and time.       Assessment & Plan:   Diagnoses and all orders for this visit:  Mixed hyperlipidemia -     Lipid panel  Primary hypertension -     CBC with Differential/Platelet -     CMP14+EGFR -     valsartan-hydrochlorothiazide (DIOVAN-HCT) 80-12.5 MG tablet; Take 1 tablet by mouth daily.  Lumbar radiculopathy -     pregabalin (LYRICA) 200 MG capsule; Take 2 capsules (400 mg total) by mouth at bedtime. -     Oxycodone HCl 10 MG TABS; Take 1 tablet (10 mg total) by mouth in the morning, at noon, and at bedtime. -     Oxycodone HCl 10  MG TABS; Take 1 tablet (10 mg total) by mouth in the morning, at noon, and at bedtime. -     Oxycodone HCl 10 MG TABS; Take 1 tablet (10 mg total) by mouth in the morning, at noon, and at bedtime.  Opiate dependence, continuous (HCC)  Dysuria -     Urinalysis -     Urine Culture       I have discontinued Lisa Parrish's oxyCODONE-acetaminophen, oxyCODONE-acetaminophen, oxyCODONE-acetaminophen, and Wegovy. I am also having her start on Oxycodone HCl, Oxycodone HCl, and Oxycodone HCl. Additionally, I am having her maintain her vitamin C, b complex vitamins, Polyethylene Glycol 3350 (MIRALAX PO), lidocaine, ibuprofen, clobetasol cream, pantoprazole, rosuvastatin, azelastine, potassium chloride, buPROPion, famotidine, cyclobenzaprine, pregabalin, and valsartan-hydrochlorothiazide.  Allergies as of 08/25/2022       Reactions   Gabapentin Anxiety   Intense, alarming dreams   Amlodipine Swelling   Hydralazine    Hydrocodone    Norco   Morphine And Codeine Nausea And Vomiting   Promethazine    Burns through the IV   Tramadol Other (See Comments)   Upset stomach   Hydrocodone-acetaminophen Nausea Only        Medication List        Accurate as of August 25, 2022  5:07 PM. If you have any questions, ask your nurse or doctor.          STOP taking these medications    oxyCODONE-acetaminophen 5-325 MG tablet Commonly known as: Percocet Stopped by: Mechele Claude, MD   Reginal Lutes 1 MG/0.5ML Soaj Generic drug: Semaglutide-Weight Management Stopped by: Mechele Claude, MD       TAKE these medications    azelastine 0.05 % ophthalmic solution Commonly known as: OPTIVAR PLACE 1 DROP INTO EACH EYE ONCE DAILY AS NEEDED FOR ALLERGIES   b complex vitamins capsule Take 1 capsule by mouth daily.   buPROPion 300 MG 24 hr tablet Commonly known as: WELLBUTRIN XL Take 1 tablet (300 mg total) by mouth daily. Dx:  Ata.Sales.A   clobetasol cream 0.05 % Commonly known as: TEMOVATE APPLY  TO AFFECTED AREA TWICE A DAY   cyclobenzaprine 5 MG tablet Commonly known as: FLEXERIL Take 1 tablet (5 mg total) by mouth 3 (three) times daily.   famotidine 20 MG tablet Commonly known as: PEPCID Take 1 tablet (20 mg total) by mouth 2 (two) times daily.   ibuprofen 800 MG tablet Commonly known as: ADVIL Take 1 tablet (800 mg total) by mouth 3 (three) times daily.   lidocaine 2 % solution Commonly known as: XYLOCAINE Use as directed 15 mLs in the mouth or throat as needed for mouth pain.   MIRALAX PO 2-3 times a week   Oxycodone HCl 10 MG Tabs Take  1 tablet (10 mg total) by mouth in the morning, at noon, and at bedtime. Started by: Mechele Claude, MD   Oxycodone HCl 10 MG Tabs Take 1 tablet (10 mg total) by mouth in the morning, at noon, and at bedtime. Start taking on: September 24, 2022 Started by: Mechele Claude, MD   Oxycodone HCl 10 MG Tabs Take 1 tablet (10 mg total) by mouth in the morning, at noon, and at bedtime. Start taking on: October 24, 2022 Started by: Mechele Claude, MD   pantoprazole 40 MG tablet Commonly known as: PROTONIX Take 1 tablet (40 mg total) by mouth 2 (two) times daily before a meal.   potassium chloride 10 MEQ tablet Commonly known as: KLOR-CON Take 1 tablet (10 mEq total) by mouth daily.   pregabalin 200 MG capsule Commonly known as: LYRICA Take 2 capsules (400 mg total) by mouth at bedtime.   rosuvastatin 5 MG tablet Commonly known as: Crestor Take 1 tablet (5 mg total) by mouth daily. For cholesterol   valsartan-hydrochlorothiazide 80-12.5 MG tablet Commonly known as: DIOVAN-HCT Take 1 tablet by mouth daily.   vitamin C 1000 MG tablet Take 1,000 mg by mouth daily.         Follow-up: Return in about 3 months (around 11/25/2022).  Mechele Claude, M.D.

## 2022-08-25 NOTE — Patient Instructions (Signed)
Kegel Exercises  Kegel exercises can help strengthen your pelvic floor muscles. The pelvic floor is a group of muscles that support your rectum, small intestine, and bladder. In females, pelvic floor muscles also help support the uterus. These muscles help you control the flow of urine and stool (feces). Kegel exercises are painless and simple. They do not require any equipment. Your provider may suggest Kegel exercises to: Improve bladder and bowel control. Improve sexual response. Improve weak pelvic floor muscles after surgery to remove the uterus (hysterectomy) or after pregnancy, in females. Improve weak pelvic floor muscles after prostate gland removal or surgery, in males. Kegel exercises involve squeezing your pelvic floor muscles. These are the same muscles you squeeze when you try to stop the flow of urine or keep from passing gas. The exercises can be done while sitting, standing, or lying down, but it is best to vary your position. Ask your health care provider which exercises are safe for you. Do exercises exactly as told by your health care provider and adjust them as directed. Do not begin these exercises until told by your health care provider. Exercises How to do Kegel exercises: Squeeze your pelvic floor muscles tight. You should feel a tight lift in your rectal area. If you are a female, you should also feel a tightness in your vaginal area. Keep your stomach, buttocks, and legs relaxed. Hold the muscles tight for up to 10 seconds. Breathe normally. Relax your muscles for up to 10 seconds. Repeat as told by your health care provider. Repeat this exercise daily as told by your health care provider. Continue to do this exercise for at least 4-6 weeks, or for as long as told by your health care provider. You may be referred to a physical therapist who can help you learn more about how to do Kegel exercises. Depending on your condition, your health care provider may  recommend: Varying how long you squeeze your muscles. Doing several sets of exercises every day. Doing exercises for several weeks. Making Kegel exercises a part of your regular exercise routine. This information is not intended to replace advice given to you by your health care provider. Make sure you discuss any questions you have with your health care provider. Document Revised: 06/28/2020 Document Reviewed: 06/28/2020 Elsevier Patient Education  2024 [employer]  

## 2022-08-26 LAB — URINE CULTURE: Organism ID, Bacteria: NO GROWTH

## 2022-08-26 NOTE — Progress Notes (Signed)
Hello Lisa Parrish,  Your lab result is normal and/or stable.Some minor variations that are not significant are commonly marked abnormal, but do not represent any medical problem for you.  Best regards, Aleshia Cartelli, M.D.

## 2022-08-27 NOTE — Progress Notes (Signed)
Hello Lisa Parrish,  Your lab result is normal and/or stable.Some minor variations that are not significant are commonly marked abnormal, but do not represent any medical problem for you.  Best regards, Nyiesha Beever, M.D.

## 2022-08-28 ENCOUNTER — Other Ambulatory Visit: Payer: Self-pay | Admitting: Family Medicine

## 2022-10-02 ENCOUNTER — Other Ambulatory Visit: Payer: Self-pay | Admitting: Orthopaedic Surgery

## 2022-10-02 DIAGNOSIS — M545 Low back pain, unspecified: Secondary | ICD-10-CM

## 2022-10-07 ENCOUNTER — Ambulatory Visit
Admission: RE | Admit: 2022-10-07 | Discharge: 2022-10-07 | Disposition: A | Discharge status: Home / Self Care | Payer: 59 | Source: Ambulatory Visit | Attending: Orthopaedic Surgery | Admitting: Orthopaedic Surgery

## 2022-10-07 DIAGNOSIS — M545 Low back pain, unspecified: Secondary | ICD-10-CM

## 2022-10-16 NOTE — Patient Instructions (Signed)
Our records indicate that you are due for your screening mammogram.  Please call the imaging center that does your yearly mammograms to make an appointment for a mammogram at your earliest convenience. Our office also has a mobile unit through the [facility] that comes to our location. Please call our office if you would like to make an appointment.   

## 2022-10-20 ENCOUNTER — Encounter: Payer: 59 | Admitting: Family Medicine

## 2022-10-20 NOTE — Progress Notes (Signed)
erroneous

## 2022-10-28 ENCOUNTER — Telehealth: Payer: Self-pay | Admitting: Family Medicine

## 2022-10-29 NOTE — Telephone Encounter (Signed)
Please see what her concern is.

## 2022-10-30 NOTE — Telephone Encounter (Signed)
No worries. As long as she has prescriptions from her surgeon there will be no problem.

## 2022-10-30 NOTE — Telephone Encounter (Signed)
Pt said she dropped paperwork off that needed to be filled out by pcp for surgeon because she is going to be getting narcotics after the surgery and pt needs to make sure she doesn't take something that will interfere with her pain management contract.

## 2022-11-04 ENCOUNTER — Encounter: Payer: Self-pay | Admitting: Family Medicine

## 2022-11-04 NOTE — Telephone Encounter (Signed)
Okay to do pain f/u visit by video.

## 2022-11-08 ENCOUNTER — Other Ambulatory Visit: Payer: Self-pay | Admitting: Family Medicine

## 2022-11-17 ENCOUNTER — Telehealth (INDEPENDENT_AMBULATORY_CARE_PROVIDER_SITE_OTHER): Payer: 59 | Admitting: Family Medicine

## 2022-11-17 DIAGNOSIS — F32A Depression, unspecified: Secondary | ICD-10-CM

## 2022-11-17 DIAGNOSIS — I10 Essential (primary) hypertension: Secondary | ICD-10-CM | POA: Diagnosis not present

## 2022-11-17 DIAGNOSIS — K219 Gastro-esophageal reflux disease without esophagitis: Secondary | ICD-10-CM

## 2022-11-17 MED ORDER — VALSARTAN-HYDROCHLOROTHIAZIDE 80-12.5 MG PO TABS: 1.0000 | ORAL_TABLET | Freq: Every day | ORAL | 3 refills | Status: DC

## 2022-11-17 MED ORDER — POTASSIUM CHLORIDE ER 10 MEQ PO TBCR: 10.0000 meq | EXTENDED_RELEASE_TABLET | Freq: Every day | ORAL | 1 refills | Status: DC

## 2022-11-17 MED ORDER — BUPROPION HCL ER (XL) 300 MG PO TB24
300.0000 mg | ORAL_TABLET | Freq: Every day | ORAL | 2 refills | Status: DC
Start: 2022-11-17 — End: 2023-05-18

## 2022-11-17 MED ORDER — PANTOPRAZOLE SODIUM 40 MG PO TBEC
40.0000 mg | DELAYED_RELEASE_TABLET | Freq: Two times a day (BID) | ORAL | 2 refills | Status: DC
Start: 2022-11-17 — End: 2023-10-05

## 2022-11-17 NOTE — Progress Notes (Unsigned)
Subjective:    Patient ID: Lisa Parrish, female    DOB: [birth year], 62 y.o.   MRN: [id number]   HPI: Lisa Parrish is a 62 y.o. female presenting for one week follow up of surgery. Laminectomy, facetectomy and foraminotomy doen on 11/10/22 at [place] [facility]  At [facility]. Report reviewed. Micah Flesher well. Surgeon currently prescribing oxycodone 15 mg pills q 4 hours with flexeril 5 mg q 8 hours. She is requiring q4 for adequate pain control at this point. PDMP shows scrip for oxycodone 15 mg # 180 written by surgeon on 9/10. Also managing well with uprpion regarding anxiety and depression.       08/25/2022    9:06 AM 06/24/2022    1:09 PM 06/24/2022   12:58 PM 06/04/2022    3:56 PM 06/04/2022    3:40 PM  Depression screen PHQ 2/9  Decreased Interest 0 0 0 0 0  Down, Depressed, Hopeless 0 1 0 0 0  PHQ - 2 Score 0 1 0 0 0  Altered sleeping 0 0  1   Tired, decreased energy 1 1  1    Change in appetite 0 0  0   Feeling bad or failure about yourself  0 0  0   Trouble concentrating 0 0  0   Moving slowly or fidgety/restless 0 0  0   Suicidal thoughts 0 0  0   PHQ-9 Score 1 2  2    Difficult doing work/chores Somewhat difficult Somewhat difficult  Somewhat difficult       08/25/2022    9:06 AM 06/24/2022    1:09 PM 06/04/2022    3:56 PM 10/29/2020    3:28 PM  GAD 7 : Generalized Anxiety Score  Nervous, Anxious, on Edge 1 0 0 0  Control/stop worrying 0 0 1 0  Worry too much - different things 0 0 0 0  Trouble relaxing 0 0 0 0  Restless 0 0 0 0  Easily annoyed or irritable 1 1 1  0  Afraid - awful might happen 0 0 0 0  Total GAD 7 Score 2 1 2  0  Anxiety Difficulty Not difficult at all Somewhat difficult Somewhat difficult       Relevant past medical, surgical, family and social history reviewed and updated as indicated.  Interim medical history since our last visit reviewed. Allergies and medications reviewed and updated.  ROS:  Review of Systems   Constitutional: Negative.   HENT: Negative.    Eyes:  Negative for visual disturbance.  Respiratory:  Negative for shortness of breath.   Cardiovascular:  Negative for chest pain.  Gastrointestinal:  Negative for abdominal pain.  Musculoskeletal:  Positive for arthralgias and back pain.     Social History   Tobacco Use  Smoking Status Former   Current packs/day: 0.00   Average packs/day: 1 pack/day for 10.0 years (10.0 ttl pk-yrs)   Types: Cigarettes   Start date: 09/17/1981   Quit date: 09/18/1991   Years since quitting: 31.1  Smokeless Tobacco Never       Objective:     Wt Readings from Last 3 Encounters:  08/25/22 173 lb 3.2 oz (78.6 kg)  06/24/22 166 lb 6.4 oz (75.5 kg)  06/04/22 165 lb 3.2 oz (74.9 kg)     Exam deferred. Video visit performed.   Assessment & Plan:   1. Depression, unspecified depression type   2. Gastroesophageal reflux disease without esophagitis   3. Primary hypertension  Meds ordered this encounter  Medications   potassium chloride (KLOR-CON) 10 MEQ tablet    Sig: Take 1 tablet (10 mEq total) by mouth daily.    Dispense:  90 tablet    Refill:  1   buPROPion (WELLBUTRIN XL) 300 MG 24 hr tablet    Sig: Take 1 tablet (300 mg total) by mouth daily. Dx:  Ata.Sales.A    Dispense:  120 tablet    Refill:  2   pantoprazole (PROTONIX) 40 MG tablet    Sig: Take 1 tablet (40 mg total) by mouth 2 (two) times daily before a meal.    Dispense:  180 tablet    Refill:  2   valsartan-hydrochlorothiazide (DIOVAN-HCT) 80-12.5 MG tablet    Sig: Take 1 tablet by mouth daily.    Dispense:  90 tablet    Refill:  3    Pt. Getting pain meds from surgeon. Will defer her med refills until post op pain meds are complete.    Diagnoses and all orders for this visit:  Depression, unspecified depression type -     buPROPion (WELLBUTRIN XL) 300 MG 24 hr tablet; Take 1 tablet (300 mg total) by mouth daily. Dx:  Ata.Sales.A  Gastroesophageal reflux disease without  esophagitis -     pantoprazole (PROTONIX) 40 MG tablet; Take 1 tablet (40 mg total) by mouth 2 (two) times daily before a meal.  Primary hypertension -     valsartan-hydrochlorothiazide (DIOVAN-HCT) 80-12.5 MG tablet; Take 1 tablet by mouth daily.  Other orders -     potassium chloride (KLOR-CON) 10 MEQ tablet; Take 1 tablet (10 mEq total) by mouth daily.    Virtual Visit  Note  I discussed the limitations, risks, security and privacy concerns of performing an evaluation and management service by video and the availability of in person appointments. The patient was identified with two identifiers. Pt.expressed understanding and agreed to proceed. Pt. Is at home. Dr. Darlyn Read is in his office.  Follow Up Instructions:   I discussed the assessment and treatment plan with the patient. The patient was provided an opportunity to ask questions and all were answered. The patient agreed with the plan and demonstrated an understanding of the instructions.   The patient was advised to call back or seek an in-person evaluation if the symptoms worsen or if the condition fails to improve as anticipated.   Total minutes contact time: 21   Follow up plan: No follow-ups on file.  Mechele Claude, MD [facility]

## 2022-11-18 ENCOUNTER — Other Ambulatory Visit: Payer: Self-pay | Admitting: Family Medicine

## 2022-11-18 ENCOUNTER — Encounter: Payer: Self-pay | Admitting: Family Medicine

## 2023-01-11 ENCOUNTER — Other Ambulatory Visit: Payer: Self-pay | Admitting: Family Medicine

## 2023-01-19 ENCOUNTER — Other Ambulatory Visit: Payer: Self-pay | Admitting: Family Medicine

## 2023-01-19 ENCOUNTER — Telehealth: Payer: Self-pay | Admitting: Family Medicine

## 2023-01-19 DIAGNOSIS — Z780 Asymptomatic menopausal state: Secondary | ICD-10-CM

## 2023-01-19 NOTE — Telephone Encounter (Signed)
Please order bone density for patient to be done at [facility]

## 2023-01-19 NOTE — Telephone Encounter (Signed)
Copied from CRM [id number]. Topic: General - Other >> Jan 19, 2023  1:01 PM Prudencio Pair wrote: Reason for CRM: Patient is wanting to find out if she needs a prescription for a bone density test to be done. If so, she's wanting to get that done before the end of year. She states she's going to have mammogram done before Thanksgiving and is trying to get bone density done as well. Would like for Dr. Darlyn Read or nurse to contact her in regards to this. CB # [id number].

## 2023-01-19 NOTE — Telephone Encounter (Signed)
Test ordered

## 2023-01-27 ENCOUNTER — Encounter: Payer: 59 | Admitting: Family Medicine

## 2023-01-27 ENCOUNTER — Ambulatory Visit: Payer: 59 | Admitting: Family Medicine

## 2023-02-12 ENCOUNTER — Encounter: Payer: 59 | Admitting: Family Medicine

## 2023-02-18 ENCOUNTER — Encounter: Payer: Self-pay | Admitting: Family Medicine

## 2023-02-22 ENCOUNTER — Other Ambulatory Visit: Payer: Self-pay | Admitting: Family Medicine

## 2023-04-02 ENCOUNTER — Other Ambulatory Visit: Payer: Self-pay | Admitting: Family Medicine

## 2023-04-09 ENCOUNTER — Encounter: Payer: Self-pay | Admitting: Family Medicine

## 2023-04-09 ENCOUNTER — Ambulatory Visit: Payer: 59 | Admitting: Family Medicine

## 2023-04-09 ENCOUNTER — Telehealth: Payer: Self-pay

## 2023-04-09 VITALS — BP 161/92 | HR 103 | Temp 98.7°F | Ht 66.0 in | Wt 186.0 lb

## 2023-04-09 DIAGNOSIS — D649 Anemia, unspecified: Secondary | ICD-10-CM

## 2023-04-09 DIAGNOSIS — R000 Tachycardia, unspecified: Secondary | ICD-10-CM

## 2023-04-09 DIAGNOSIS — I10 Essential (primary) hypertension: Secondary | ICD-10-CM

## 2023-04-09 DIAGNOSIS — Z1331 Encounter for screening for depression: Secondary | ICD-10-CM

## 2023-04-09 DIAGNOSIS — R002 Palpitations: Secondary | ICD-10-CM | POA: Diagnosis not present

## 2023-04-09 DIAGNOSIS — R21 Rash and other nonspecific skin eruption: Secondary | ICD-10-CM

## 2023-04-09 DIAGNOSIS — G479 Sleep disorder, unspecified: Secondary | ICD-10-CM

## 2023-04-09 MED ORDER — HYDROXYZINE HCL 25 MG PO TABS: 25.0000 mg | ORAL_TABLET | Freq: Three times a day (TID) | ORAL | 0 refills | Status: AC | PRN

## 2023-04-09 NOTE — Progress Notes (Signed)
 Subjective:  Patient ID: Lisa Parrish, female    DOB: [birth year], 63 y.o.   MRN: [id number]  Patient Care Team: Zollie Lowers, MD as PCP - General (Family Medicine) Charls Pearla LABOR, MD (Inactive) as PCP - Cardiology (Cardiology)   Chief Complaint:  flu like symptoms (Night sweats/Hot flashes /Body aches/Coughing/X 3 weeks)  HPI: Lisa Parrish is a 63 y.o. female presenting on 04/09/2023 for flu like symptoms (Night sweats/Hot flashes /Body aches/Coughing/X 3 weeks) Cough/Hot flashes States that since January 10th she has been having worsening night sweats, hot flashes, weight gain, dry skin, and jitteriness/nervousness. Has not changed lotions, shampoo, conditioner States that she also has cough and she is battling a cold. She is taking dayquil, nyquil. States that she is not sleeping. Reports lack of energy. States that she had flu shot in October. She did not complete any home tests.   Elevated BP/HTN States that she has a monitor at home. States that BP is 140/80. The lowest that she gets at home is 120/80. States that she has not taken her medication this morning. Reports that she is very stressed with symptoms.   Relevant past medical, surgical, family, and social history reviewed and updated as indicated.  Allergies and medications reviewed and updated. Data reviewed: Chart in Epic.   Past Medical History:  Diagnosis Date   Allergy    Anxiety    Anxiety state 08/24/2012   Basal cell carcinoma    Right Leg    Chronic left SI joint pain    WORK COMP    Constipation    due to pain meds    Depression    GERD (gastroesophageal reflux disease)    Headache, migraine    Hyperlipidemia    Hypertension    Kidney stones    Osteoarthritis    back   Pulmonary HTN (HCC) 12/21/2014   S/P cardiac cath 12/20/14 normal coronary arteries 12/21/2014   Seasonal allergies     Past Surgical History:  Procedure Laterality Date   CARDIAC CATHETERIZATION N/A  12/20/2014   Procedure: Left Heart Cath and Coronary Angiography;  Surgeon: Ozell Fell, MD;  Location: [facility] INVASIVE CV LAB;  Service: Cardiovascular;  Laterality: N/A;   COLONOSCOPY     HEMORRHOID SURGERY  1989   lumbar disckectomy  2006   LUMBAR FUSION  2018   POLYPECTOMY     TUBAL LIGATION  1990    Social History   Socioeconomic History   Marital status: Married    Spouse name: Not on file   Number of children: Not on file   Years of education: Not on file   Highest education level: Not on file  Occupational History   Not on file  Tobacco Use   Smoking status: Former    Current packs/day: 0.00    Average packs/day: 1 pack/day for 10.0 years (10.0 ttl pk-yrs)    Types: Cigarettes    Start date: 09/17/1981    Quit date: 09/18/1991    Years since quitting: 31.5   Smokeless tobacco: Never  Vaping Use   Vaping status: Never Used  Substance and Sexual Activity   Alcohol use: No   Drug use: No    Comment: no IVDA   Sexual activity: Yes  Other Topics Concern   Not on file  Social History Narrative   Married, works as a occupational psychologist.     Social Drivers of Corporate Investment Banker Strain: Not on file  Food Insecurity: Low  Risk  (11/10/2022)   Received from [facility]   Hunger Vital Sign    Worried About Running Out of Food in the Last Year: Never true    Ran Out of Food in the Last Year: Never true  Transportation Needs: No Transportation Needs (11/10/2022)   Received from [facility]    In the past 12 months, has lack of reliable transportation kept you from medical appointments, meetings, work or from getting things needed for daily living? : No  Physical Activity: Not on file  Stress: Not on file  Social Connections: Unknown (07/15/2021)   Received from [facility]   Social Network    Social Network: Not on file  Intimate Partner Violence: Unknown (06/06/2021)   Received from [facility], [facility]    HITS    Physically Hurt: Not on file    Insult or Talk Down To: Not on file    Threaten Physical Harm: Not on file    Scream or Curse: Not on file    Outpatient Encounter Medications as of 04/09/2023  Medication Sig   Ascorbic Acid (VITAMIN C) 1000 MG tablet Take 1,000 mg by mouth daily.   azelastine  (OPTIVAR ) 0.05 % ophthalmic solution PLACE 1 DROP INTO EACH EYE ONCE DAILY AS NEEDED FOR ALLERGIES   b complex vitamins capsule Take 1 capsule by mouth daily.   buPROPion  (WELLBUTRIN  XL) 300 MG 24 hr tablet Take 1 tablet (300 mg total) by mouth daily. Dx:  BEN.BORN.A   clobetasol  cream (TEMOVATE ) 0.05 % APPLY TO AFFECTED AREA TWICE A DAY   cyclobenzaprine  (FLEXERIL ) 5 MG tablet TAKE 1 TABLET BY MOUTH THREE TIMES A DAY   famotidine  (PEPCID ) 20 MG tablet Take 1 tablet (20 mg total) by mouth 2 (two) times daily.   Oxycodone  HCl 10 MG TABS Take 1 tablet (10 mg total) by mouth in the morning, at noon, and at bedtime.   Oxycodone  HCl 10 MG TABS Take 1 tablet (10 mg total) by mouth in the morning, at noon, and at bedtime.   Oxycodone  HCl 10 MG TABS Take 1 tablet (10 mg total) by mouth in the morning, at noon, and at bedtime.   pantoprazole  (PROTONIX ) 40 MG tablet Take 1 tablet (40 mg total) by mouth 2 (two) times daily before a meal.   Polyethylene Glycol 3350  (MIRALAX PO) 2-3 times a week   potassium chloride  (KLOR-CON ) 10 MEQ tablet Take 1 tablet (10 mEq total) by mouth daily. **NEEDS TO BE SEEN BEFORE NEXT REFILL**   pregabalin  (LYRICA ) 200 MG capsule Take 2 capsules (400 mg total) by mouth at bedtime.   rosuvastatin  (CRESTOR ) 5 MG tablet TAKE 1 TABLET (5 MG TOTAL) BY MOUTH DAILY FOR CHOLESTEROL   valsartan -hydrochlorothiazide  (DIOVAN -HCT) 80-12.5 MG tablet Take 1 tablet by mouth daily.   No facility-administered encounter medications on file as of 04/09/2023.    Allergies  Allergen Reactions   Gabapentin Anxiety    Intense, alarming dreams   Amlodipine  Swelling   Hydralazine     Hydrocodone       Norco   Morphine  And Codeine  Nausea And Vomiting   Promethazine     Burns through the IV   Tramadol Other (See Comments)    Upset stomach   Hydrocodone -Acetaminophen  Nausea Only    Review of Systems As per HPI  Objective:  BP (!) 161/92   Pulse (!) 103   Temp 98.7 F (37.1 C)   Ht 5' 6 (1.676 m)   Wt  186 lb (84.4 kg)   SpO2 98%   BMI 30.02 kg/m    Wt Readings from Last 3 Encounters:  04/09/23 186 lb (84.4 kg)  08/25/22 173 lb 3.2 oz (78.6 kg)  06/24/22 166 lb 6.4 oz (75.5 kg)   Physical Exam Constitutional:      General: She is awake. She is not in acute distress.    Appearance: Normal appearance. She is well-developed. She is obese. She is ill-appearing. She is not toxic-appearing or diaphoretic.  HENT:     Right Ear: No laceration, drainage, swelling or tenderness. A middle ear effusion is present. There is no impacted cerumen. No foreign body. No mastoid tenderness. No PE tube. No hemotympanum. Tympanic membrane is not injected, scarred, perforated, erythematous, retracted or bulging.     Left Ear: No laceration, drainage, swelling or tenderness. A middle ear effusion is present. There is no impacted cerumen. No foreign body. No mastoid tenderness. No PE tube. No hemotympanum. Tympanic membrane is not injected, scarred, perforated, erythematous, retracted or bulging.     Nose: Congestion and rhinorrhea present. Rhinorrhea is clear.     Right Sinus: Maxillary sinus tenderness and frontal sinus tenderness present.     Left Sinus: Maxillary sinus tenderness and frontal sinus tenderness present.     Mouth/Throat:     Lips: Pink. No lesions.     Mouth: Mucous membranes are moist.     Pharynx: Posterior oropharyngeal erythema present. No pharyngeal swelling, oropharyngeal exudate, uvula swelling or postnasal drip.     Tonsils: No tonsillar exudate or tonsillar abscesses.  Cardiovascular:     Rate and Rhythm: Regular rhythm. Tachycardia present.     Heart sounds: Normal heart  sounds.  Pulmonary:     Effort: Pulmonary effort is normal.     Breath sounds: Normal breath sounds. No stridor, decreased air movement or transmitted upper airway sounds. No decreased breath sounds, wheezing, rhonchi or rales.  Lymphadenopathy:     Head:     Right side of head: No submental, submandibular, tonsillar, preauricular or posterior auricular adenopathy.     Left side of head: No submental, submandibular, tonsillar, preauricular or posterior auricular adenopathy.     Cervical:     Right cervical: No superficial cervical adenopathy.    Left cervical: No superficial cervical adenopathy.  Skin:    General: Skin is warm.     Capillary Refill: Capillary refill takes less than 2 seconds.  Neurological:     General: No focal deficit present.     Mental Status: She is alert, oriented to person, place, and time and easily aroused. Mental status is at baseline.  Psychiatric:        Attention and Perception: Attention and perception normal.        Mood and Affect: Mood is anxious.        Speech: Speech normal.        Behavior: Behavior is cooperative.        Thought Content: Thought content normal.        Cognition and Memory: Cognition and memory normal.        Judgment: Judgment normal.     Results for orders placed or performed in visit on 08/25/22  Urine Culture   Collection Time: 08/25/22  9:42 AM   Specimen: Urine   UR  Result Value Ref Range   Urine Culture, Routine Final report    Organism ID, Bacteria No growth   Urinalysis   Collection Time: 08/25/22  9:42 AM  Result Value Ref Range   Specific Gravity, UA <1.005 (L) 1.005 - 1.030   pH, UA 6.0 5.0 - 7.5   Color, UA Yellow Yellow   Appearance Ur Clear Clear   Leukocytes,UA Negative Negative   Protein,UA Negative Negative/Trace   Glucose, UA Negative Negative   Ketones, UA Negative Negative   RBC, UA Negative Negative   Bilirubin, UA Negative Negative   Urobilinogen, Ur 0.2 0.2 - 1.0 mg/dL   Nitrite, UA  Negative Negative  CBC with Differential/Platelet   Collection Time: 08/25/22  9:46 AM  Result Value Ref Range   WBC 5.1 3.4 - 10.8 x10E3/uL   RBC 4.12 3.77 - 5.28 x10E6/uL   Hemoglobin 12.5 11.1 - 15.9 g/dL   Hematocrit 62.8 65.9 - 46.6 %   MCV 90 79 - 97 fL   MCH 30.3 26.6 - 33.0 pg   MCHC 33.7 31.5 - 35.7 g/dL   RDW 87.1 88.2 - 84.5 %   Platelets 215 150 - 450 x10E3/uL   Neutrophils 63 Not Estab. %   Lymphs 24 Not Estab. %   Monocytes 8 Not Estab. %   Eos 4 Not Estab. %   Basos 1 Not Estab. %   Neutrophils Absolute 3.2 1.4 - 7.0 x10E3/uL   Lymphocytes Absolute 1.2 0.7 - 3.1 x10E3/uL   Monocytes Absolute 0.4 0.1 - 0.9 x10E3/uL   EOS (ABSOLUTE) 0.2 0.0 - 0.4 x10E3/uL   Basophils Absolute 0.0 0.0 - 0.2 x10E3/uL   Immature Granulocytes 0 Not Estab. %   Immature Grans (Abs) 0.0 0.0 - 0.1 x10E3/uL  CMP14+EGFR   Collection Time: 08/25/22  9:46 AM  Result Value Ref Range   Glucose 85 70 - 99 mg/dL   BUN 14 8 - 27 mg/dL   Creatinine, Ser 9.32 0.57 - 1.00 mg/dL   eGFR 99 >40 fO/fpw/8.26   BUN/Creatinine Ratio 21 12 - 28   Sodium 140 134 - 144 mmol/L   Potassium 4.4 3.5 - 5.2 mmol/L   Chloride 100 96 - 106 mmol/L   CO2 24 20 - 29 mmol/L   Calcium  9.7 8.7 - 10.3 mg/dL   Total Protein 7.1 6.0 - 8.5 g/dL   Albumin 4.7 3.9 - 4.9 g/dL   Globulin, Total 2.4 1.5 - 4.5 g/dL   Bilirubin Total 0.4 0.0 - 1.2 mg/dL   Alkaline Phosphatase 103 44 - 121 IU/L   AST 20 0 - 40 IU/L   ALT 16 0 - 32 IU/L  Lipid panel   Collection Time: 08/25/22  9:46 AM  Result Value Ref Range   Cholesterol, Total 177 100 - 199 mg/dL   Triglycerides 793 (H) 0 - 149 mg/dL   HDL 55 >60 mg/dL   VLDL Cholesterol Cal 35 5 - 40 mg/dL   LDL Chol Calc (NIH) 87 0 - 99 mg/dL   Chol/HDL Ratio 3.2 0.0 - 4.4 ratio       04/09/2023   10:22 AM 08/25/2022    9:06 AM 06/24/2022    1:09 PM 06/24/2022   12:58 PM 06/04/2022    3:56 PM  Depression screen PHQ 2/9  Decreased Interest 1 0 0 0 0  Down, Depressed, Hopeless 1 0 1  0 0  PHQ - 2 Score 2 0 1 0 0  Altered sleeping 2 0 0  1  Tired, decreased energy 2 1 1  1   Change in appetite 2 0 0  0  Feeling bad or failure about yourself  0 0 0  0  Trouble concentrating 1 0 0  0  Moving slowly or fidgety/restless 0 0 0  0  Suicidal thoughts 0 0 0  0  PHQ-9 Score 9 1 2  2   Difficult doing work/chores Somewhat difficult Somewhat difficult Somewhat difficult  Somewhat difficult       08/25/2022    9:06 AM 06/24/2022    1:09 PM 06/04/2022    3:56 PM 10/29/2020    3:28 PM  GAD 7 : Generalized Anxiety Score  Nervous, Anxious, on Edge 1 0 0 0  Control/stop worrying 0 0 1 0  Worry too much - different things 0 0 0 0  Trouble relaxing 0 0 0 0  Restless 0 0 0 0  Easily annoyed or irritable 1 1 1  0  Afraid - awful might happen 0 0 0 0  Total GAD 7 Score 2 1 2  0  Anxiety Difficulty Not difficult at all Somewhat difficult Somewhat difficult       Pertinent labs & imaging results that were available during my care of the patient were reviewed by me and considered in my medical decision making.  Assessment & Plan:  Casady was seen today for flu like symptoms.  Diagnoses and all orders for this visit: 1. Primary hypertension (Primary) Elevated BP in office. Patient did not take medications this morning. Instructed patient to take medications when she went home and call in an hour with BP measurements. She is to follow up with PCP and continue to monitor her BP at home. Labs as below. Will communicate results to patient once available. Will await results to determine next steps.  - CMP14+EGFR  2. Anemia, unspecified type Reviewed chart and patient was anemic 11/11/2022 without follow up. May explain symptoms that she is experiencing. Labs as below. Will communicate results to patient once available. Will await results to determine next steps.  - Anemia Profile B  3. Palpitations Labs as below. Will communicate results to patient once available. Will await results to  determine next steps.  - TSH - VITAMIN D  25 Hydroxy (Vit-D Deficiency, Fractures) - CMP14+EGFR  4. Tachycardia Labs as below. Will communicate results to patient once available. Will await results to determine next steps.  - TSH - VITAMIN D  25 Hydroxy (Vit-D Deficiency, Fractures) - CMP14+EGFR  5. Rash Will start medication as below. In addition, discussed mixing steroid cream and thick tub lotion (Equate Eczema, Cetaphil, Cerave, Aquaphor, or Eucerin) and then applying to affected areas. - hydrOXYzine  (ATARAX ) 25 MG tablet; Take 1 tablet (25 mg total) by mouth 3 (three) times daily as needed.  Dispense: 30 tablet; Refill: 0  6. Difficulty sleeping Will start medication as below to help with itching and difficulty sleeping.  - hydrOXYzine  (ATARAX ) 25 MG tablet; Take 1 tablet (25 mg total) by mouth 3 (three) times daily as needed.  Dispense: 30 tablet; Refill: 0  7. Encounter for screening for depression Elevated screening today. Denies SI. Patient to follow up with PCP if they would like to initiate treatment.    Continue all other maintenance medications.  Follow up plan: Return if symptoms worsen or fail to improve.   Continue healthy lifestyle choices, including diet (rich in fruits, vegetables, and lean proteins, and low in salt and simple carbohydrates) and exercise (at least 30 minutes of moderate physical activity daily).  Written and verbal instructions provided   The above assessment and management plan was discussed with the patient. The patient verbalized understanding of and has agreed to  the management plan. Patient is aware to call the clinic if they develop any new symptoms or if symptoms persist or worsen. Patient is aware when to return to the clinic for a follow-up visit. Patient educated on when it is appropriate to go to the emergency department.   Marry Kins, DNP-FNP [facility] [address] [place], [place] [address] [phone]

## 2023-04-09 NOTE — Telephone Encounter (Signed)
 Per patient, home monitor 138/80 after taking medications. Will have patient follow up with PCP to continue to monitor BP

## 2023-04-09 NOTE — Patient Instructions (Addendum)
 Mix steroid cream and thick tub lotion (Equate Eczema, Cetaphil, Cerave, Aquaphor, or Eucerin) and then applying to affected areas. Apply to areas twice daily for 2 weeks. Continue with lotion only after two weeks. Do not use steroid cream more than twice daily or more than 2 weeks.    Check BP one hour after taking medications and call with results.    It appears that you have a viral upper respiratory infection (cold).  Cold symptoms can last up to 2 weeks.  I recommend that you only use cold medications that are safe in high blood pressure like Coricidin (generic is fine).  Other cold medications can increase your blood pressure.    - Get plenty of rest and drink plenty of fluids. - Try to breathe moist air. Use a cold mist humidifier. - Consume warm fluids (soup or tea) to provide relief for a stuffy nose and to loosen phlegm. - For nasal stuffiness, try saline nasal spray or a Neti Pot.  Afrin nasal spray can also be used but this product should not be used longer than 3 days or it will cause rebound nasal stuffiness (worsening nasal congestion). - For sore throat pain relief: use chloraseptic spray, suck on throat lozenges, hard candy or popsicles; gargle with warm salt water (1/4 tsp. salt per 8 oz. of water); and eat soft, bland foods. - Eat a well-balanced diet. If you cannot, ensure you are getting enough nutrients by taking a daily multivitamin. - Avoid dairy products, as they can thicken phlegm. - Avoid alcohol, as it impairs your body's immune system.  CONTACT YOUR DOCTOR IF YOU EXPERIENCE ANY OF THE FOLLOWING: - High fever - Ear pain - Sinus-type headache - Unusually severe cold symptoms - Cough that gets worse while other cold symptoms improve - Flare up of any chronic lung problem, such as asthma - Your symptoms persist longer than 2 weeks

## 2023-04-09 NOTE — Telephone Encounter (Signed)
 Copied from CRM #[id number]. Topic: General - Other >> Apr 09, 2023 12:56 PM Valeri Gate H wrote: Reason for CRM: Patient wants to get a message to FNP Jacqualyn Mates, wants to let her know her BP after taking medication is 138/80

## 2023-04-10 ENCOUNTER — Encounter: Payer: Self-pay | Admitting: Family Medicine

## 2023-04-10 LAB — CMP14+EGFR
ALT: 36 [IU]/L — ABNORMAL HIGH (ref 0–32)
AST: 32 [IU]/L (ref 0–40)
Albumin: 5.1 g/dL — ABNORMAL HIGH (ref 3.9–4.9)
Alkaline Phosphatase: 118 [IU]/L (ref 44–121)
BUN/Creatinine Ratio: 16 (ref 12–28)
BUN: 12 mg/dL (ref 8–27)
Bilirubin Total: 0.4 mg/dL (ref 0.0–1.2)
CO2: 22 mmol/L (ref 20–29)
Calcium: 9.9 mg/dL (ref 8.7–10.3)
Chloride: 99 mmol/L (ref 96–106)
Creatinine, Ser: 0.75 mg/dL (ref 0.57–1.00)
Globulin, Total: 2.4 g/dL (ref 1.5–4.5)
Glucose: 105 mg/dL — ABNORMAL HIGH (ref 70–99)
Potassium: 4 mmol/L (ref 3.5–5.2)
Sodium: 141 mmol/L (ref 134–144)
Total Protein: 7.5 g/dL (ref 6.0–8.5)
eGFR: 90 mL/min/{1.73_m2} (ref >59)

## 2023-04-10 LAB — ANEMIA PROFILE B
Basophils Absolute: 0.1 10*3/uL (ref 0.0–0.2)
Basos: 1 %
EOS (ABSOLUTE): 0.2 10*3/uL (ref 0.0–0.4)
Eos: 4 %
Ferritin: 38 ng/mL (ref 15–150)
Folate: >20 ng/mL (ref >3.0)
Hematocrit: 40.6 % (ref 34.0–46.6)
Hemoglobin: 13.6 g/dL (ref 11.1–15.9)
Immature Grans (Abs): 0 10*3/uL (ref 0.0–0.1)
Immature Granulocytes: 0 %
Iron Saturation: 32 % (ref 15–55)
Iron: 141 ug/dL — ABNORMAL HIGH (ref 27–139)
Lymphocytes Absolute: 1.6 10*3/uL (ref 0.7–3.1)
Lymphs: 31 %
MCH: 28.5 pg (ref 26.6–33.0)
MCHC: 33.5 g/dL (ref 31.5–35.7)
MCV: 85 fL (ref 79–97)
Monocytes Absolute: 0.5 10*3/uL (ref 0.1–0.9)
Monocytes: 9 %
Neutrophils Absolute: 2.8 10*3/uL (ref 1.4–7.0)
Neutrophils: 55 %
Platelets: 271 10*3/uL (ref 150–450)
RBC: 4.77 x10E6/uL (ref 3.77–5.28)
RDW: 14.8 % (ref 11.7–15.4)
Retic Ct Pct: 1.2 % (ref 0.6–2.6)
Total Iron Binding Capacity: 447 ug/dL (ref 250–450)
UIBC: 306 ug/dL (ref 118–369)
Vitamin B-12: 570 pg/mL (ref 232–1245)
WBC: 5 10*3/uL (ref 3.4–10.8)

## 2023-04-10 LAB — TSH: TSH: 1.27 u[IU]/mL (ref 0.450–4.500)

## 2023-04-10 LAB — VITAMIN D 25 HYDROXY (VIT D DEFICIENCY, FRACTURES): Vit D, 25-Hydroxy: 56 ng/mL (ref 30.0–100.0)

## 2023-04-10 NOTE — Progress Notes (Signed)
 Can we add on A1C for slightly elevated BG. Slightly elevated iron, albumin, and ALT. Will continue to monitor on future labs. All other labs normal.

## 2023-04-14 ENCOUNTER — Encounter: Payer: Self-pay | Admitting: Family Medicine

## 2023-04-14 ENCOUNTER — Encounter: Payer: Self-pay | Admitting: Gastroenterology

## 2023-04-15 LAB — HGB A1C W/O EAG: Hgb A1c MFr Bld: 6.1 % — ABNORMAL HIGH (ref 4.8–5.6)

## 2023-04-15 LAB — SPECIMEN STATUS REPORT

## 2023-04-16 NOTE — Progress Notes (Signed)
 A1C in prediabetes range. Recommendhealthy lifestyle choices, including diet (rich in fruits, vegetables, and lean proteins, and low in salt and simple carbohydrates) and exercise (at least 30 minutes of moderate physical activity daily). Limit beverages high is sugar. Recommended at least 80-100 oz of water daily. Will recheck in 3 months.

## 2023-04-23 ENCOUNTER — Ambulatory Visit: Payer: 59 | Admitting: Family Medicine

## 2023-04-23 ENCOUNTER — Encounter: Payer: Self-pay | Admitting: Family Medicine

## 2023-04-23 DIAGNOSIS — M5416 Radiculopathy, lumbar region: Secondary | ICD-10-CM

## 2023-04-23 MED ORDER — QUETIAPINE FUMARATE 25 MG PO TABS: 25.0000 mg | ORAL_TABLET | Freq: Every day | ORAL | 1 refills | Status: DC

## 2023-04-23 MED ORDER — PREGABALIN 200 MG PO CAPS: 400.0000 mg | ORAL_CAPSULE | Freq: Every day | ORAL | 1 refills | Status: DC

## 2023-04-23 MED ORDER — OXYCODONE HCL 10 MG PO TABS: 10.0000 mg | ORAL_TABLET | Freq: Three times a day (TID) | ORAL | 0 refills | Status: DC

## 2023-04-23 NOTE — Patient Instructions (Addendum)
Diabetes Mellitus and Exercise Regular exercise is important for your health, especially if you have diabetes mellitus. Exercise is not just about losing weight. It can also help you increase muscle strength and bone density and reduce body fat and stress. This can help your level of endurance and make you more fit and flexible. Why should I exercise if I have diabetes? Exercise has many benefits for people with diabetes. It can: Help lower and control your blood sugar (glucose). Help your body respond better and become more sensitive to the hormone insulin. Reduce how much insulin your body needs. Lower your risk for heart disease by: Lowering how much "bad" cholesterol and triglycerides you have in your body. Increasing how much "good" cholesterol you have in your body. Lowering your blood pressure. Lowering your blood glucose levels. What is my activity plan? Your health care provider or an expert trained in diabetes care (certified diabetes educator) can help you make an activity plan. This plan can help you find the type of exercise that works for you. It may also tell you how often to exercise and for how long. Be sure to: Get at least 150 minutes of medium-intensity or high-intensity exercise each week. This may involve brisk walking, biking, or water aerobics. Do stretching and strengthening exercises at least 2 times a week. This may involve yoga or weight lifting. Spread out your activity over at least 3 days of the week. Get some form of physical activity each day. Do not go more than 2 days in a row without some kind of activity. Avoid being inactive for more than 30 minutes at a time. Take frequent breaks to walk or stretch. Choose activities that you enjoy. Set goals that you know you can accomplish. Start slowly and increase the intensity of your exercise over time. How do I manage my diabetes during exercise?  Monitor your blood glucose Check your blood glucose before and  after you exercise. If your blood glucose is 240 mg/dL (82.9 mmol/L) or higher before you exercise, check your urine for ketones. These are chemicals created by the liver. If you have ketones in your urine, do not exercise until your blood glucose returns to normal. If your blood glucose is 100 mg/dL (5.6 mmol/L) or lower, eat a snack that has 15-20 grams of carbohydrate in it. Check your blood glucose 15 minutes after the snack to make sure that your level is above 100 mg/dL (5.6 mmol/L) before you start to exercise. Your risk for low blood glucose (hypoglycemia) goes up during and after exercise. Know the symptoms of this condition and how to treat it. Follow these instructions at home: Keep a carbohydrate snack on hand for use before, during, and after exercise. This can help prevent or treat hypoglycemia. Avoid injecting insulin into parts of your body that are going to be used during exercise. This may include: Your arms, when you are going to play tennis. Your legs, when you are about to go jogging. Keep track of your exercise habits. This can help you and your health care provider watch and adjust your activity plan. Write down: What you eat before and after you exercise. Blood glucose levels before and after you exercise. The type and amount of exercise you do. Talk to your health care provider before you start a new activity. They may need to: Make sure that the activity is safe for you. Adjust your insulin, other medicines, and food that you eat. Drink water while you exercise. This can  stop you from losing too much water (dehydration). It can also prevent problems caused by having a lot of heat in your body (heat stroke). Where to find more information American Diabetes Association: diabetes.org Association of Diabetes Care & Education Specialists: diabeteseducator.org This information is not intended to replace advice given to you by your health care provider. Make sure you discuss  any questions you have with your health care provider. Document Revised: 08/07/2021 Document Reviewed: 08/07/2021 Elsevier Patient Education  2024 Else[employer] Inc.Carbohydrate Counting for Diabetes Mellitus, Adult Carbohydrate counting is a method of keeping track of how many carbohydrates you eat. Eating carbohydrates increases the amount of sugar (glucose) in the blood. Counting how many carbohydrates you eat improves how well you manage your blood glucose. This, in turn, helps you manage your diabetes. Carbohydrates are measured in grams (g) per serving. It is important to know how many carbohydrates (in grams or by serving size) you can have in each meal. This is different for every person. A dietitian can help you make a meal plan and calculate how many carbohydrates you should have at each meal and snack. What foods contain carbohydrates? Carbohydrates are found in the following foods: Grains, such as breads and cereals. Dried beans and soy products. Starchy vegetables, such as potatoes, peas, and corn. Fruit and fruit juices. Milk and yogurt. Sweets and snack foods, such as cake, cookies, candy, chips, and soft drinks. How do I count carbohydrates in foods? There are two ways to count carbohydrates in food. You can read food labels or learn standard serving sizes of foods. You can use either of these methods or a combination of both. Using the Nutrition Facts label The Nutrition Facts list is included on the labels of almost all packaged foods and beverages in the [place]. It includes: The serving size. Information about nutrients in each serving, including the grams of carbohydrate per serving. To use the Nutrition Facts, decide how many servings you will have. Then, multiply the number of servings by the number of carbohydrates per serving. The resulting number is the total grams of carbohydrates that you will be having. Learning the standard serving sizes of foods When you eat  carbohydrate foods that are not packaged or do not include Nutrition Facts on the label, you need to measure the servings in order to count the grams of carbohydrates. Measure the foods that you will eat with a food scale or measuring cup, if needed. Decide how many standard-size servings you will eat. Multiply the number of servings by 15. For foods that contain carbohydrates, one serving equals 15 g of carbohydrates. For example, if you eat 2 cups or 10 oz (300 g) of strawberries, you will have eaten 2 servings and 30 g of carbohydrates (2 servings x 15 g = 30 g). For foods that have more than one food mixed, such as soups and casseroles, you must count the carbohydrates in each food that is included. The following list contains standard serving sizes of common carbohydrate-rich foods. Each of these servings has about 15 g of carbohydrates: 1 slice of bread. 1 six-inch (15 cm) tortilla. ? cup or 2 oz (53 g) cooked rice or pasta.  cup or 3 oz (85 g) cooked or canned, drained and rinsed beans or lentils.  cup or 3 oz (85 g) starchy vegetable, such as peas, corn, or squash.  cup or 4 oz (120 g) hot cereal.  cup or 3 oz (85 g) boiled or mashed potatoes, or  or 3 oz (85 g) of a large baked potato.  cup or 4 fl oz (118 mL) fruit juice. 1 cup or 8 fl oz (237 mL) milk. 1 small or 4 oz (106 g) apple.  or 2 oz (63 g) of a medium banana. 1 cup or 5 oz (150 g) strawberries. 3 cups or 1 oz (28.3 g) popped popcorn. What is an example of carbohydrate counting? To calculate the grams of carbohydrates in this sample meal, follow the steps shown below. Sample meal 3 oz (85 g) chicken breast. ? cup or 4 oz (106 g) brown rice.  cup or 3 oz (85 g) corn. 1 cup or 8 fl oz (237 mL) milk. 1 cup or 5 oz (150 g) strawberries with sugar-free whipped topping. Carbohydrate calculation Identify the foods that contain carbohydrates: Rice. Corn. Milk. Strawberries. Calculate how many servings you have of  each food: 2 servings rice. 1 serving corn. 1 serving milk. 1 serving strawberries. Multiply each number of servings by 15 g: 2 servings rice x 15 g = 30 g. 1 serving corn x 15 g = 15 g. 1 serving milk x 15 g = 15 g. 1 serving strawberries x 15 g = 15 g. Add together all of the amounts to find the total grams of carbohydrates eaten: 30 g + 15 g + 15 g + 15 g = 75 g of carbohydrates total. What are tips for following this plan? Shopping Develop a meal plan and then make a shopping list. Buy fresh and frozen vegetables, fresh and frozen fruit, dairy, eggs, beans, lentils, and whole grains. Look at food labels. Choose foods that have more fiber and less sugar. Avoid processed foods and foods with added sugars. Meal planning Aim to have the same number of grams of carbohydrates at each meal and for each snack time. Plan to have regular, balanced meals and snacks. Where to find more information American Diabetes Association: diabetes.org Centers for Disease Control and Prevention: Ton[employer].no Academy of Nutrition and Dietetics: eatright.org Association of Diabetes Care & Education Specialists: diabeteseducator.org Summary Carbohydrate counting is a method of keeping track of how many carbohydrates you eat. Eating carbohydrates increases the amount of sugar (glucose) in your blood. Counting how many carbohydrates you eat improves how well you manage your blood glucose. This helps you manage your diabetes. A dietitian can help you make a meal plan and calculate how many carbohydrates you should have at each meal and snack. This information is not intended to replace advice given to you by your health care provider. Make sure you discuss any questions you have with your health care provider. Document Revised: 09/20/2019 Document Reviewed: 09/21/2019 Elsevier Patient Education  2024 [employer].

## 2023-04-23 NOTE — Progress Notes (Signed)
Subjective:  Patient ID: Lisa Parrish, female    DOB: 1960-10-20  Age: 63 y.o. MRN: [id number]  CC: Back Pain (Back surgery. Out of meds for 3 weeks but released by surgeon. Would like oxycodone called in again. ), cold (Started almost 4 weeks. Tested neg for covid. Stuffy nose, runny nose, cough, and dry throat. Wont clear up. ), and nerve pain  (Nerve pain and feeling liek she wants to jump out of skin. Review notes with gabrielle. )   HPI Lisa Parrish presents for nerves on edge. Ran out of oxycodone 4-5 weeks ago. Pain somewhat better than before surgery. Able to to IADLs, chores and even some travel that she couldn't tolerate before.      04/23/2023    1:23 PM 04/09/2023   10:22 AM 08/25/2022    9:06 AM  Depression screen PHQ 2/9  Decreased Interest 0 1 0  Down, Depressed, Hopeless 0 1 0  PHQ - 2 Score 0 2 0  Altered sleeping 2 2 0  Tired, decreased energy 2 2 1   Change in appetite 1 2 0  Feeling bad or failure about yourself  0 0 0  Trouble concentrating 1 1 0  Moving slowly or fidgety/restless 2 0 0  Suicidal thoughts 0 0 0  PHQ-9 Score 8 9 1   Difficult doing work/chores Somewhat difficult Somewhat difficult Somewhat difficult    History Lisa Parrish has a past medical history of Allergy, Anxiety, Anxiety state (08/24/2012), Basal cell carcinoma, Chronic left SI joint pain, Constipation, Depression, GERD (gastroesophageal reflux disease), Headache, migraine, Hyperlipidemia, Hypertension, Kidney stones, Osteoarthritis, Pulmonary HTN (HCC) (12/21/2014), S/P cardiac cath 12/20/14 normal coronary arteries (12/21/2014), and Seasonal allergies.   She has a past surgical history that includes Hemorrhoid surgery (1989); Tubal ligation (1990); lumbar disckectomy (2006); Cardiac catheterization (N/A, 12/20/2014); Colonoscopy; Polypectomy; and Lumbar fusion (2018).   Her family history includes Cancer in her mother; Colon cancer in her paternal uncle; Colon cancer (age of onset: 60)  in her paternal aunt and paternal uncle; Esophageal cancer in her brother; Heart disease in her father.She reports that she quit smoking about 31 years ago. Her smoking use included cigarettes. She started smoking about 41 years ago. She has a 10 pack-year smoking history. She has never used smokeless tobacco. She reports that she does not drink alcohol and does not use drugs.    ROS Review of Systems  Constitutional: Negative.   HENT:  Positive for congestion and rhinorrhea.   Eyes:  Negative for visual disturbance.  Respiratory:  Negative for shortness of breath.   Cardiovascular:  Negative for chest pain.  Gastrointestinal:  Negative for abdominal pain.  Musculoskeletal:  Positive for back pain. Negative for arthralgias.  Psychiatric/Behavioral:  Positive for agitation. The patient is nervous/anxious.     Objective:  BP (!) 150/89   Pulse 95   Temp (!) 97.5 F (36.4 C)   Ht 5\' 6"  (1.676 m)   Wt 186 lb (84.4 kg)   SpO2 97%   BMI 30.02 kg/m   BP Readings from Last 3 Encounters:  04/23/23 (!) 150/89  04/09/23 (!) 161/92  08/25/22 138/86    Wt Readings from Last 3 Encounters:  04/23/23 186 lb (84.4 kg)  04/09/23 186 lb (84.4 kg)  08/25/22 173 lb 3.2 oz (78.6 kg)     Physical Exam Constitutional:      General: She is not in acute distress.    Appearance: She is well-developed.  Cardiovascular:     Rate and  Rhythm: Normal rate and regular rhythm.  Pulmonary:     Breath sounds: Normal breath sounds.  Musculoskeletal:        General: Normal range of motion.  Skin:    General: Skin is warm and dry.  Neurological:     Mental Status: She is alert and oriented to person, place, and time.       Assessment & Plan:   Lisa Parrish was seen today for back pain, cold and nerve pain .  Diagnoses and all orders for this visit:  Lumbar radiculopathy -     Oxycodone HCl 10 MG TABS; Take 1 tablet (10 mg total) by mouth in the morning, at noon, and at bedtime. -     pregabalin  (LYRICA) 200 MG capsule; Take 2 capsules (400 mg total) by mouth at bedtime. -     ToxASSURE Select 13 (MW), Urine  Other orders -     QUEtiapine (SEROQUEL) 25 MG tablet; Take 1 tablet (25 mg total) by mouth at bedtime. For nerves/ anxiety       I am having Lisa Parrish start on QUEtiapine. I am also having her maintain her vitamin C, b complex vitamins, Polyethylene Glycol 3350 (MIRALAX PO), clobetasol cream, famotidine, Oxycodone HCl, Oxycodone HCl, cyclobenzaprine, buPROPion, pantoprazole, valsartan-hydrochlorothiazide, azelastine, rosuvastatin, potassium chloride, hydrOXYzine, Oxycodone HCl, and pregabalin.  Allergies as of 04/23/2023       Reactions   Gabapentin Anxiety   Intense, alarming dreams   Amlodipine Swelling   Hydralazine    Hydrocodone    Norco   Morphine And Codeine Nausea And Vomiting   Promethazine    Burns through the IV   Tramadol Other (See Comments)   Upset stomach   Hydrocodone-acetaminophen Nausea Only        Medication List        Accurate as of April 23, 2023  2:54 PM. If you have any questions, ask your nurse or doctor.          azelastine 0.05 % ophthalmic solution Commonly known as: OPTIVAR PLACE 1 DROP INTO EACH EYE ONCE DAILY AS NEEDED FOR ALLERGIES   b complex vitamins capsule Take 1 capsule by mouth daily.   buPROPion 300 MG 24 hr tablet Commonly known as: WELLBUTRIN XL Take 1 tablet (300 mg total) by mouth daily. Dx:  [email or web address]A   clobetasol cream 0.05 % Commonly known as: TEMOVATE APPLY TO AFFECTED AREA TWICE A DAY   cyclobenzaprine 5 MG tablet Commonly known as: FLEXERIL TAKE 1 TABLET BY MOUTH THREE TIMES A DAY   famotidine 20 MG tablet Commonly known as: PEPCID Take 1 tablet (20 mg total) by mouth 2 (two) times daily.   hydrOXYzine 25 MG tablet Commonly known as: ATARAX Take 1 tablet (25 mg total) by mouth 3 (three) times daily as needed.   MIRALAX PO 2-3 times a week   Oxycodone HCl 10 MG Tabs Take 1  tablet (10 mg total) by mouth in the morning, at noon, and at bedtime.   Oxycodone HCl 10 MG Tabs Take 1 tablet (10 mg total) by mouth in the morning, at noon, and at bedtime.   Oxycodone HCl 10 MG Tabs Take 1 tablet (10 mg total) by mouth in the morning, at noon, and at bedtime.   pantoprazole 40 MG tablet Commonly known as: PROTONIX Take 1 tablet (40 mg total) by mouth 2 (two) times daily before a meal.   potassium chloride 10 MEQ tablet Commonly known as: KLOR-CON Take 1 tablet (10 mEq  total) by mouth daily. **NEEDS TO BE SEEN BEFORE NEXT REFILL**   pregabalin 200 MG capsule Commonly known as: LYRICA Take 2 capsules (400 mg total) by mouth at bedtime.   QUEtiapine 25 MG tablet Commonly known as: SEROQUEL Take 1 tablet (25 mg total) by mouth at bedtime. For nerves/ anxiety Started by: Lisa Parrish   rosuvastatin 5 MG tablet Commonly known as: CRESTOR TAKE 1 TABLET (5 MG TOTAL) BY MOUTH DAILY FOR CHOLESTEROL   valsartan-hydrochlorothiazide 80-12.5 MG tablet Commonly known as: DIOVAN-HCT Take 1 tablet by mouth daily.   vitamin C 1000 MG tablet Take 1,000 mg by mouth daily.         Follow-up: Return in about 1 month (around 05/21/2023).  Mechele Claude, M.D.

## 2023-04-28 LAB — TOXASSURE SELECT 13 (MW), URINE

## 2023-05-18 ENCOUNTER — Ambulatory Visit: Payer: 59 | Admitting: Family Medicine

## 2023-05-18 ENCOUNTER — Encounter: Payer: Self-pay | Admitting: Family Medicine

## 2023-05-18 VITALS — BP 125/80 | HR 98 | Temp 98.1°F | Ht 66.0 in | Wt 185.0 lb

## 2023-05-18 DIAGNOSIS — K219 Gastro-esophageal reflux disease without esophagitis: Secondary | ICD-10-CM

## 2023-05-18 DIAGNOSIS — G8929 Other chronic pain: Secondary | ICD-10-CM

## 2023-05-18 DIAGNOSIS — F32A Depression, unspecified: Secondary | ICD-10-CM

## 2023-05-18 MED ORDER — FAMOTIDINE 20 MG PO TABS: 20.0000 mg | ORAL_TABLET | Freq: Two times a day (BID) | ORAL | 3 refills | Status: AC

## 2023-05-18 MED ORDER — OXYCODONE-ACETAMINOPHEN 5-325 MG PO TABS: ORAL_TABLET | ORAL | 0 refills | Status: DC

## 2023-05-18 MED ORDER — POTASSIUM CHLORIDE ER 10 MEQ PO TBCR: 10.0000 meq | EXTENDED_RELEASE_TABLET | Freq: Every day | ORAL | 0 refills | Status: DC

## 2023-05-18 MED ORDER — BUPROPION HCL ER (XL) 300 MG PO TB24: 300.0000 mg | ORAL_TABLET | Freq: Every day | ORAL | 2 refills | Status: DC

## 2023-05-18 NOTE — Progress Notes (Signed)
 Subjective:  Patient ID: Lisa Parrish, female    DOB: 05-Apr-1960  Age: 63 y.o. MRN: [id number]  CC: Medical Management of Chronic Issues (Refills pended/Would like to stop pain meds somehow/ start a program. ) and Labs Only (Creatinine was hig last time.)   HPI Holliday Sheaffer presents for pain iis about the same as last month. Taking 10 mg oxy in afternoon and at night. Procedure improved it a lot. Pt. Now wants to try to do without.      05/18/2023    2:09 PM 04/23/2023    1:23 PM 04/09/2023   10:22 AM  Depression screen PHQ 2/9  Decreased Interest 0 0 1  Down, Depressed, Hopeless 0 0 1  PHQ - 2 Score 0 0 2  Altered sleeping 0 2 2  Tired, decreased energy 0 2 2  Change in appetite 0 1 2  Feeling bad or failure about yourself  0 0 0  Trouble concentrating 0 1 1  Moving slowly or fidgety/restless 0 2 0  Suicidal thoughts 0 0 0  PHQ-9 Score 0 8 9  Difficult doing work/chores Not difficult at all Somewhat difficult Somewhat difficult    History Lindsee has a past medical history of Allergy, Anxiety, Anxiety state (08/24/2012), Basal cell carcinoma, Chronic left SI joint pain, Constipation, Depression, GERD (gastroesophageal reflux disease), Headache, migraine, Hyperlipidemia, Hypertension, Kidney stones, Osteoarthritis, Pulmonary HTN (HCC) (12/21/2014), S/P cardiac cath 12/20/14 normal coronary arteries (12/21/2014), and Seasonal allergies.   She has a past surgical history that includes Hemorrhoid surgery (1989); Tubal ligation (1990); lumbar disckectomy (2006); Cardiac catheterization (N/A, 12/20/2014); Colonoscopy; Polypectomy; and Lumbar fusion (2018).   Her family history includes Cancer in her mother; Colon cancer in her paternal uncle; Colon cancer (age of onset: 27) in her paternal aunt and paternal uncle; Esophageal cancer in her brother; Heart disease in her father.She reports that she quit smoking about 31 years ago. Her smoking use included cigarettes. She started  smoking about 41 years ago. She has a 10 pack-year smoking history. She has never used smokeless tobacco. She reports that she does not drink alcohol and does not use drugs.    ROS Review of Systems  Objective:  BP 125/80   Pulse 98   Temp 98.1 F (36.7 C)   Ht 5\' 6"  (1.676 m)   Wt 185 lb (83.9 kg)   SpO2 95%   BMI 29.86 kg/m   BP Readings from Last 3 Encounters:  05/18/23 125/80  04/23/23 (!) 150/89  04/09/23 (!) 161/92    Wt Readings from Last 3 Encounters:  05/18/23 185 lb (83.9 kg)  04/23/23 186 lb (84.4 kg)  04/09/23 186 lb (84.4 kg)     Physical Exam Constitutional:      General: She is not in acute distress.    Appearance: She is well-developed.  Cardiovascular:     Rate and Rhythm: Normal rate and regular rhythm.  Pulmonary:     Breath sounds: Normal breath sounds.  Musculoskeletal:        General: Normal range of motion.  Skin:    General: Skin is warm and dry.  Neurological:     Mental Status: She is alert and oriented to person, place, and time.       Assessment & Plan:   Elfreda was seen today for medical management of chronic issues and labs only.  Diagnoses and all orders for this visit:  Other chronic pain -     BMP8+EGFR  Gastroesophageal reflux disease  without esophagitis -     famotidine (PEPCID) 20 MG tablet; Take 1 tablet (20 mg total) by mouth 2 (two) times daily.  Depression, unspecified depression type -     buPROPion (WELLBUTRIN XL) 300 MG 24 hr tablet; Take 1 tablet (300 mg total) by mouth daily. Dx:  [email or web address]A  Other orders -     potassium chloride (KLOR-CON) 10 MEQ tablet; Take 1 tablet (10 mEq total) by mouth daily. **NEEDS TO BE SEEN BEFORE NEXT REFILL** -     oxyCODONE-acetaminophen (PERCOCET/ROXICET) 5-325 MG tablet; One three times a day for 2 weeks then one twice a day for 2 weeks.    32 minutes spent with pt. More than 1/2 in counseling regarding taper of opioid.   I have discontinued Daneen Harter-[name]'s Oxycodone  HCl, Oxycodone HCl, and Oxycodone HCl. I am also having her start on oxyCODONE-acetaminophen. Additionally, I am having her maintain her vitamin C, b complex vitamins, Polyethylene Glycol 3350 (MIRALAX PO), clobetasol cream, cyclobenzaprine, pantoprazole, valsartan-hydrochlorothiazide, azelastine, rosuvastatin, hydrOXYzine, QUEtiapine, pregabalin, potassium chloride, famotidine, and buPROPion.  Allergies as of 05/18/2023       Reactions   Gabapentin Anxiety   Intense, alarming dreams   Amlodipine Swelling   Hydralazine    Hydrocodone    Norco   Morphine And Codeine Nausea And Vomiting   Promethazine    Burns through the IV   Tramadol Other (See Comments)   Upset stomach   Hydrocodone-acetaminophen Nausea Only        Medication List        Accurate as of May 18, 2023  2:50 PM. If you have any questions, ask your nurse or doctor.          STOP taking these medications    Oxycodone HCl 10 MG Tabs Stopped by: Ameisha Mcclellan       TAKE these medications    azelastine 0.05 % ophthalmic solution Commonly known as: OPTIVAR PLACE 1 DROP INTO EACH EYE ONCE DAILY AS NEEDED FOR ALLERGIES   b complex vitamins capsule Take 1 capsule by mouth daily.   buPROPion 300 MG 24 hr tablet Commonly known as: WELLBUTRIN XL Take 1 tablet (300 mg total) by mouth daily. Dx:  [email or web address]A   clobetasol cream 0.05 % Commonly known as: TEMOVATE APPLY TO AFFECTED AREA TWICE A DAY   cyclobenzaprine 5 MG tablet Commonly known as: FLEXERIL TAKE 1 TABLET BY MOUTH THREE TIMES A DAY   famotidine 20 MG tablet Commonly known as: PEPCID Take 1 tablet (20 mg total) by mouth 2 (two) times daily.   hydrOXYzine 25 MG tablet Commonly known as: ATARAX Take 1 tablet (25 mg total) by mouth 3 (three) times daily as needed.   MIRALAX PO 2-3 times a week   oxyCODONE-acetaminophen 5-325 MG tablet Commonly known as: PERCOCET/ROXICET One three times a day for 2 weeks then one twice a day for 2  weeks. Started by: Quentin Strebel   pantoprazole 40 MG tablet Commonly known as: PROTONIX Take 1 tablet (40 mg total) by mouth 2 (two) times daily before a meal.   potassium chloride 10 MEQ tablet Commonly known as: KLOR-CON Take 1 tablet (10 mEq total) by mouth daily. **NEEDS TO BE SEEN BEFORE NEXT REFILL**   pregabalin 200 MG capsule Commonly known as: LYRICA Take 2 capsules (400 mg total) by mouth at bedtime.   QUEtiapine 25 MG tablet Commonly known as: SEROQUEL Take 1 tablet (25 mg total) by mouth at bedtime. For nerves/ anxiety   rosuvastatin  5 MG tablet Commonly known as: CRESTOR TAKE 1 TABLET (5 MG TOTAL) BY MOUTH DAILY FOR CHOLESTEROL   valsartan-hydrochlorothiazide 80-12.5 MG tablet Commonly known as: DIOVAN-HCT Take 1 tablet by mouth daily.   vitamin C 1000 MG tablet Take 1,000 mg by mouth daily.         Follow-up: Return in about 4 weeks (around 06/15/2023).  Mechele Claude, M.D.

## 2023-05-19 ENCOUNTER — Encounter: Payer: Self-pay | Admitting: Family Medicine

## 2023-05-19 LAB — BMP8+EGFR
BUN/Creatinine Ratio: 23 (ref 12–28)
BUN: 18 mg/dL (ref 8–27)
CO2: 23 mmol/L (ref 20–29)
Calcium: 9.9 mg/dL (ref 8.7–10.3)
Chloride: 102 mmol/L (ref 96–106)
Creatinine, Ser: 0.77 mg/dL (ref 0.57–1.00)
Glucose: 94 mg/dL (ref 70–99)
Potassium: 4.1 mmol/L (ref 3.5–5.2)
Sodium: 141 mmol/L (ref 134–144)
eGFR: 87 mL/min/{1.73_m2} (ref >59)

## 2023-05-19 NOTE — Progress Notes (Signed)
Hello Jini,  Your lab result is normal and/or stable.Some minor variations that are not significant are commonly marked abnormal, but do not represent any medical problem for you.  Best regards, Mechele Claude, M.D.

## 2023-06-08 ENCOUNTER — Ambulatory Visit: Admitting: Family Medicine

## 2023-06-08 ENCOUNTER — Encounter: Payer: Self-pay | Admitting: Family Medicine

## 2023-06-08 VITALS — BP 136/87 | HR 82 | Temp 98.2°F | Ht 66.0 in | Wt 184.0 lb

## 2023-06-08 DIAGNOSIS — L239 Allergic contact dermatitis, unspecified cause: Secondary | ICD-10-CM

## 2023-06-08 DIAGNOSIS — I10 Essential (primary) hypertension: Secondary | ICD-10-CM

## 2023-06-08 DIAGNOSIS — M961 Postlaminectomy syndrome, not elsewhere classified: Secondary | ICD-10-CM

## 2023-06-08 DIAGNOSIS — M5416 Radiculopathy, lumbar region: Secondary | ICD-10-CM | POA: Diagnosis not present

## 2023-06-08 DIAGNOSIS — G8929 Other chronic pain: Secondary | ICD-10-CM

## 2023-06-08 MED ORDER — OXYCODONE-ACETAMINOPHEN 5-325 MG PO TABS: 1.0000 | ORAL_TABLET | Freq: Two times a day (BID) | ORAL | 0 refills | Status: DC

## 2023-06-08 MED ORDER — CETIRIZINE-PSEUDOEPHEDRINE ER 5-120 MG PO TB12: 1.0000 | ORAL_TABLET | Freq: Two times a day (BID) | ORAL | 11 refills | Status: DC

## 2023-06-08 MED ORDER — BETAMETHASONE SOD PHOS & ACET 6 (3-3) MG/ML IJ SUSP
6.0000 mg | Freq: Once | INTRAMUSCULAR | Status: AC
  Administered 2023-06-08: 6 mg via INTRAMUSCULAR

## 2023-06-08 NOTE — Progress Notes (Addendum)
 Subjective:  Patient ID: Lisa Parrish, female    DOB: 08/19/1960  Age: 63 y.o. MRN: [id number]  CC: Allergies (Calritin D and rx eye drops. Constant drainage from eye. Ears are hurting. ), Rash (All over especially on stomach.  Started two weeks ago. No regimen changes. Would like chaperon for examination of it.  Little bumps you can feel that itch. ), and bumps (Bumps on face. Saw dermatologist a year ago and cream not helping. Not going back. )   HPI Lisa Parrish presents for recheck related to chronic pain. Pain now 5/10 most of the time. Would like to pause the taper at this level for now. Also has a pruritic rash on torso.   presents for  follow-up of hypertension. Patient has no history of headache chest pain or shortness of breath or recent cough. Patient also denies symptoms of TIA such as focal numbness or weakness. Patient denies side effects from medication. States taking it regularly.      05/18/2023    2:09 PM 04/23/2023    1:23 PM 04/09/2023   10:22 AM  Depression screen PHQ 2/9  Decreased Interest 0 0 1  Down, Depressed, Hopeless 0 0 1  PHQ - 2 Score 0 0 2  Altered sleeping 0 2 2  Tired, decreased energy 0 2 2  Change in appetite 0 1 2  Feeling bad or failure about yourself  0 0 0  Trouble concentrating 0 1 1  Moving slowly or fidgety/restless 0 2 0  Suicidal thoughts 0 0 0  PHQ-9 Score 0 8 9  Difficult doing work/chores Not difficult at all Somewhat difficult Somewhat difficult    History Lisa Parrish has a past medical history of Allergy, Anxiety, Anxiety state (08/24/2012), Basal cell carcinoma, Chronic left SI joint pain, Constipation, Depression, GERD (gastroesophageal reflux disease), Headache, migraine, Hyperlipidemia, Hypertension, Kidney stones, Osteoarthritis, Pulmonary HTN (HCC) (12/21/2014), S/P cardiac cath 12/20/14 normal coronary arteries (12/21/2014), and Seasonal allergies.   She has a past surgical history that includes Hemorrhoid surgery (1989);  Tubal ligation (1990); lumbar disckectomy (2006); Cardiac catheterization (N/A, 12/20/2014); Colonoscopy; Polypectomy; and Lumbar fusion (2018).   Her family history includes Cancer in her mother; Colon cancer in her paternal uncle; Colon cancer (age of onset: 69) in her paternal aunt and paternal uncle; Esophageal cancer in her brother; Heart disease in her father.She reports that she quit smoking about 31 years ago. Her smoking use included cigarettes. She started smoking about 41 years ago. She has a 10 pack-year smoking history. She has never used smokeless tobacco. She reports that she does not drink alcohol and does not use drugs.    ROS Review of Systems  Constitutional: Negative.   HENT: Negative.    Eyes:  Negative for visual disturbance.  Respiratory:  Negative for shortness of breath.   Cardiovascular:  Negative for chest pain.  Gastrointestinal:  Negative for abdominal pain.  Musculoskeletal:  Positive for arthralgias and back pain.  Skin:  Positive for rash.    Objective:  BP 136/87   Pulse 82   Temp 98.2 F (36.8 C)   Ht 5\' 6"  (1.676 m)   Wt 184 lb (83.5 kg)   SpO2 96%   BMI 29.70 kg/m   BP Readings from Last 3 Encounters:  06/08/23 136/87  05/18/23 125/80  04/23/23 (!) 150/89    Wt Readings from Last 3 Encounters:  06/08/23 184 lb (83.5 kg)  05/18/23 185 lb (83.9 kg)  04/23/23 186 lb (84.4 kg)  Physical Exam Constitutional:      General: She is not in acute distress.    Appearance: She is well-developed.  Cardiovascular:     Rate and Rhythm: Normal rate and regular rhythm.  Pulmonary:     Breath sounds: Normal breath sounds.  Musculoskeletal:        General: Normal range of motion.  Skin:    General: Skin is warm and dry.     Findings: No rash (maculopapular erythema on ext & abd.).  Neurological:     Mental Status: She is alert and oriented to person, place, and time.      Assessment & Plan:  Allergic dermatitis -     Betamethasone Sod  Phos & Acet  Failed back syndrome of lumbar spine  Other chronic pain  Lumbar radiculopathy  Primary hypertension  Other orders -     Cetirizine-Pseudoephedrine ER; Take 1 tablet by mouth 2 (two) times daily.  Dispense: 60 tablet; Refill: 11 -     oxyCODONE-Acetaminophen; Take 1 tablet by mouth 2 (two) times daily. One three times a day for 2 weeks then one twice a day for 2 weeks.  Dispense: 60 tablet; Refill: 0 -     oxyCODONE-Acetaminophen; Take 1 tablet by mouth 2 (two) times daily. One three times a day for 2 weeks then one twice a day for 2 weeks.  Dispense: 60 tablet; Refill: 0     Follow-up: Return in about 2 months (around 08/08/2023).  Roise Cleaver, M.D.

## 2023-06-09 ENCOUNTER — Encounter: Payer: Self-pay | Admitting: Family Medicine

## 2023-06-09 MED ORDER — TRETINOIN 0.05 % EX CREA: TOPICAL_CREAM | Freq: Every day | CUTANEOUS | 2 refills | Status: AC

## 2023-06-15 ENCOUNTER — Ambulatory Visit: Admitting: Family Medicine

## 2023-06-23 ENCOUNTER — Encounter: Payer: Self-pay | Admitting: Family Medicine

## 2023-06-24 NOTE — Telephone Encounter (Signed)
 Fluid pills are not meant for this type of swelling

## 2023-07-14 ENCOUNTER — Encounter: Payer: Self-pay | Admitting: Family Medicine

## 2023-07-17 ENCOUNTER — Other Ambulatory Visit: Payer: Self-pay | Admitting: Family Medicine

## 2023-08-10 ENCOUNTER — Ambulatory Visit: Payer: Self-pay | Admitting: Family Medicine

## 2023-08-10 ENCOUNTER — Encounter: Payer: Self-pay | Admitting: Family Medicine

## 2023-08-10 ENCOUNTER — Ambulatory Visit: Admitting: Family Medicine

## 2023-08-10 VITALS — BP 139/85 | HR 96 | Temp 98.0°F | Ht 66.0 in | Wt 186.0 lb

## 2023-08-10 DIAGNOSIS — D649 Anemia, unspecified: Secondary | ICD-10-CM | POA: Diagnosis not present

## 2023-08-10 DIAGNOSIS — I10 Essential (primary) hypertension: Secondary | ICD-10-CM

## 2023-08-10 DIAGNOSIS — E782 Mixed hyperlipidemia: Secondary | ICD-10-CM | POA: Diagnosis not present

## 2023-08-10 DIAGNOSIS — R7309 Other abnormal glucose: Secondary | ICD-10-CM | POA: Diagnosis not present

## 2023-08-10 DIAGNOSIS — M5416 Radiculopathy, lumbar region: Secondary | ICD-10-CM

## 2023-08-10 DIAGNOSIS — H6501 Acute serous otitis media, right ear: Secondary | ICD-10-CM

## 2023-08-10 LAB — BAYER DCA HB A1C WAIVED: HB A1C (BAYER DCA - WAIVED): 5.4 % (ref 4.8–5.6)

## 2023-08-10 MED ORDER — PREGABALIN 200 MG PO CAPS: 400.0000 mg | ORAL_CAPSULE | Freq: Every day | ORAL | 1 refills | Status: DC

## 2023-08-10 MED ORDER — OXYCODONE-ACETAMINOPHEN 5-325 MG PO TABS: 1.0000 | ORAL_TABLET | Freq: Three times a day (TID) | ORAL | 0 refills | Status: DC

## 2023-08-10 MED ORDER — VALSARTAN-HYDROCHLOROTHIAZIDE 160-25 MG PO TABS: 1.0000 | ORAL_TABLET | Freq: Every day | ORAL | 3 refills | Status: AC

## 2023-08-10 MED ORDER — AMOXICILLIN-POT CLAVULANATE 875-125 MG PO TABS: 1.0000 | ORAL_TABLET | Freq: Two times a day (BID) | ORAL | 0 refills | Status: DC

## 2023-08-10 NOTE — Progress Notes (Signed)
 Subjective:  Parrish ID: Lisa Parrish, female    DOB: 25-Jan-1961  Age: 63 y.o. MRN: [id number]  CC: Medical Management of Chronic Issues (Medications pended) and Ear Pain (Right ear pain starting this morning. Just pain and muffled hearing but no drainage. )   HPI Lisa Parrish presents for follow-up of hypertension. Parrish has no history of headache chest pain or shortness of breath or recent cough. Parrish also denies symptoms of TIA such as numbness weakness lateralizing. Parrish checks  blood pressure at home and has not had any elevated readings recently. Parrish denies side effects from his medication. States taking it regularly. Lisa Parrish reports that the blood pressure is a little better today than usual however it still rather high, and she reports that she has been having excessive swelling.  As result for going to bump up the blood pressure medicine to give her little bit more fluid relief.  Quetiapine  made her swell too much she discontinued that.  Unfortunately she could not tolerate it because she said it did work well.  Lisa Parrish also tells me that her right ear has started hurting today.  It feels full inside.  She also has been having some wheezing this morning particularly walking to the office from the parking lot.  The oxycodone  twice a day is just not giving her adequate relief and she is having excessive breakthrough pain.  As result she requested and I agreed to stabilizing her on a dose of three 5 mg Percocet daily.  PDMP review shows appropriate utilization of the Parrish's controlled medications. She has reduced her daily dose from a high of 90 morphine  milligram equivalents last December to 27-1/2 morphine  milligram equivalents currently.    08/10/2023   10:18 AM 05/18/2023    2:09 PM 04/23/2023    1:23 PM  Depression screen PHQ 2/9  Decreased Interest 0 0 0  Down, Depressed, Hopeless 0 0 0  PHQ - 2 Score 0 0 0  Altered sleeping 2 0 2  Tired,  decreased energy 1 0 2  Change in appetite 1 0 1  Feeling bad or failure about yourself  0 0 0  Trouble concentrating 0 0 1  Moving slowly or fidgety/restless 1 0 2  Suicidal thoughts 0 0 0  PHQ-9 Score 5 0 8  Difficult doing work/chores  Not difficult at all Somewhat difficult    History Lisa Parrish has a past medical history of Allergy, Anxiety, Anxiety state (08/24/2012), Basal cell carcinoma, Chronic left SI joint pain, Constipation, Depression, GERD (gastroesophageal reflux disease), Headache, migraine, Hyperlipidemia, Hypertension, Kidney stones, Osteoarthritis, Pulmonary HTN (HCC) (12/21/2014), S/P cardiac cath 12/20/14 normal coronary arteries (12/21/2014), and Seasonal allergies.   She has a past surgical history that includes Hemorrhoid surgery (1989); Tubal ligation (1990); lumbar disckectomy (2006); Cardiac catheterization (N/A, 12/20/2014); Colonoscopy; Polypectomy; and Lumbar fusion (2018).   Her family history includes Cancer in her mother; Colon cancer in her paternal uncle; Colon cancer (age of onset: 57) in her paternal aunt and paternal uncle; Esophageal cancer in her brother; Heart disease in her father.She reports that she quit smoking about 31 years ago. Her smoking use included cigarettes. She started smoking about 41 years ago. She has a 10 pack-year smoking history. She has never used smokeless tobacco. She reports that she does not drink alcohol and does not use drugs.    ROS Review of Systems  Constitutional: Negative.   HENT:  Positive for ear pain.   Eyes:  Negative for visual disturbance.  Respiratory:  Positive for wheezing. Negative for shortness of breath.   Cardiovascular:  Negative for chest pain.  Gastrointestinal:  Negative for abdominal pain.  Musculoskeletal:  Positive for arthralgias and back pain.    Objective:  BP 139/85   Pulse 96   Temp 98 F (36.7 C)   Ht 5\' 6"  (1.676 m)   Wt 186 lb (84.4 kg)   SpO2 97%   BMI 30.02 kg/m   BP Readings from  Last 3 Encounters:  08/10/23 139/85  06/08/23 136/87  05/18/23 125/80    Wt Readings from Last 3 Encounters:  08/10/23 186 lb (84.4 kg)  06/08/23 184 lb (83.5 kg)  05/18/23 185 lb (83.9 kg)     Physical Exam Constitutional:      General: She is not in acute distress.    Appearance: She is well-developed.  Cardiovascular:     Rate and Rhythm: Normal rate and regular rhythm.  Pulmonary:     Breath sounds: Normal breath sounds.  Musculoskeletal:        General: Normal range of motion.  Skin:    General: Skin is warm and dry.  Neurological:     Mental Status: She is alert and oriented to person, place, and time.      Assessment & Plan:  Lumbar radiculopathy -     CBC with Differential/Platelet -     Pregabalin ; Take 2 capsules (400 mg total) by mouth at bedtime.  Dispense: 180 capsule; Refill: 1 -     oxyCODONE -Acetaminophen ; Take 1 tablet by mouth in the morning, at noon, and at bedtime.  Dispense: 90 tablet; Refill: 0 -     oxyCODONE -Acetaminophen ; Take 1 tablet by mouth in the morning, at noon, and at bedtime.  Dispense: 90 tablet; Refill: 0 -     oxyCODONE -Acetaminophen ; Take 1 tablet by mouth 3 (three) times daily.  Dispense: 90 tablet; Refill: 0  Anemia, unspecified type -     Anemia Profile B -     CBC with Differential/Platelet  Mixed hyperlipidemia -     CMP14+EGFR -     Lipid panel -     CBC with Differential/Platelet  Elevated hemoglobin A1c -     Bayer DCA Hb A1c Waived  Primary hypertension -     CMP14+EGFR -     CBC with Differential/Platelet -     Valsartan -hydroCHLOROthiazide ; Take 1 tablet by mouth daily.  Dispense: 90 tablet; Refill: 3  Non-recurrent acute serous otitis media of right ear -     CBC with Differential/Platelet -     Amoxicillin -Pot Clavulanate; Take 1 tablet by mouth 2 (two) times daily. Take all of this medication  Dispense: 20 tablet; Refill: 0     Follow-up: No follow-ups on file.  Lisa Parrish, M.D.

## 2023-08-11 LAB — CMP14+EGFR
ALT: 35 IU/L — ABNORMAL HIGH (ref 0–32)
AST: 32 IU/L (ref 0–40)
Albumin: 4.6 g/dL (ref 3.9–4.9)
Alkaline Phosphatase: 98 IU/L (ref 44–121)
BUN/Creatinine Ratio: 17 (ref 12–28)
BUN: 13 mg/dL (ref 8–27)
Bilirubin Total: 0.3 mg/dL (ref 0.0–1.2)
CO2: 20 mmol/L (ref 20–29)
Calcium: 9.6 mg/dL (ref 8.7–10.3)
Chloride: 102 mmol/L (ref 96–106)
Creatinine, Ser: 0.77 mg/dL (ref 0.57–1.00)
Globulin, Total: 2.2 g/dL (ref 1.5–4.5)
Glucose: 101 mg/dL — ABNORMAL HIGH (ref 70–99)
Potassium: 4.5 mmol/L (ref 3.5–5.2)
Sodium: 139 mmol/L (ref 134–144)
Total Protein: 6.8 g/dL (ref 6.0–8.5)
eGFR: 87 mL/min/{1.73_m2} (ref >59)

## 2023-08-11 LAB — ANEMIA PROFILE B
Basophils Absolute: 0.1 10*3/uL (ref 0.0–0.2)
Basos: 1 %
EOS (ABSOLUTE): 0.2 10*3/uL (ref 0.0–0.4)
Eos: 4 %
Ferritin: 25 ng/mL (ref 15–150)
Hematocrit: 37.5 % (ref 34.0–46.6)
Hemoglobin: 11.7 g/dL (ref 11.1–15.9)
Immature Grans (Abs): 0 10*3/uL (ref 0.0–0.1)
Immature Granulocytes: 0 %
Iron Saturation: 15 % (ref 15–55)
Iron: 66 ug/dL (ref 27–139)
Lymphocytes Absolute: 1.5 10*3/uL (ref 0.7–3.1)
Lymphs: 40 %
MCH: 27.8 pg (ref 26.6–33.0)
MCHC: 31.2 g/dL — ABNORMAL LOW (ref 31.5–35.7)
MCV: 89 fL (ref 79–97)
Monocytes Absolute: 0.5 10*3/uL (ref 0.1–0.9)
Monocytes: 12 %
Neutrophils Absolute: 1.7 10*3/uL (ref 1.4–7.0)
Neutrophils: 43 %
Platelets: 212 10*3/uL (ref 150–450)
RBC: 4.21 x10E6/uL (ref 3.77–5.28)
RDW: 13.2 % (ref 11.7–15.4)
Retic Ct Pct: 1.2 % (ref 0.6–2.6)
Total Iron Binding Capacity: 427 ug/dL (ref 250–450)
UIBC: 361 ug/dL (ref 118–369)
Vitamin B-12: 595 pg/mL (ref 232–1245)
WBC: 3.9 10*3/uL (ref 3.4–10.8)

## 2023-08-11 LAB — LIPID PANEL
Chol/HDL Ratio: 4 ratio (ref 0.0–4.4)
Cholesterol, Total: 190 mg/dL (ref 100–199)
HDL: 47 mg/dL (ref >39)
LDL Chol Calc (NIH): 115 mg/dL — ABNORMAL HIGH (ref 0–99)
Triglycerides: 159 mg/dL — ABNORMAL HIGH (ref 0–149)
VLDL Cholesterol Cal: 28 mg/dL (ref 5–40)

## 2023-08-17 NOTE — Progress Notes (Signed)
Hello Jini,  Your lab result is normal and/or stable.Some minor variations that are not significant are commonly marked abnormal, but do not represent any medical problem for you.  Best regards, Mechele Claude, M.D.

## 2023-09-14 ENCOUNTER — Telehealth: Payer: Self-pay | Admitting: Family Medicine

## 2023-09-14 NOTE — Telephone Encounter (Unsigned)
 Copied from CRM [id number]. Topic: General - Billing Inquiry >> Sep 14, 2023  2:25 PM Elle L wrote: Reason for CRM: The patient states she paid a bill in March but she continues to be billed for the same thing. She has reached out to the billing department and states they are not able to assist her. The patient requested to speak to [facility] but she was not available per the office. The patient states if she does not get a call back today that she will come to the office in the morning. The patient's call back number is [phone].

## 2023-09-14 NOTE — Telephone Encounter (Signed)
 LMOM for patient to call back to my direct number [phone]

## 2023-09-14 NOTE — Telephone Encounter (Signed)
 Patient returned call and stated she had paid the $67.27 online on 07/09/23 with confirmation #[id number]. Assured patient we will work on getting this taken care of so she will not receive any more bills.

## 2023-09-16 ENCOUNTER — Other Ambulatory Visit: Payer: Self-pay | Admitting: Family Medicine

## 2023-10-04 ENCOUNTER — Other Ambulatory Visit: Payer: Self-pay | Admitting: Family Medicine

## 2023-10-04 DIAGNOSIS — K219 Gastro-esophageal reflux disease without esophagitis: Secondary | ICD-10-CM

## 2023-10-17 ENCOUNTER — Other Ambulatory Visit: Payer: Self-pay | Admitting: Family Medicine

## 2023-11-11 ENCOUNTER — Encounter: Payer: Self-pay | Admitting: Family Medicine

## 2023-11-11 ENCOUNTER — Ambulatory Visit (INDEPENDENT_AMBULATORY_CARE_PROVIDER_SITE_OTHER): Admitting: Family Medicine

## 2023-11-11 VITALS — BP 115/78 | HR 89 | Temp 98.5°F | Ht 66.0 in | Wt 186.0 lb

## 2023-11-11 DIAGNOSIS — F1120 Opioid dependence, uncomplicated: Secondary | ICD-10-CM | POA: Diagnosis not present

## 2023-11-11 DIAGNOSIS — E663 Overweight: Secondary | ICD-10-CM

## 2023-11-11 DIAGNOSIS — M5416 Radiculopathy, lumbar region: Secondary | ICD-10-CM

## 2023-11-11 DIAGNOSIS — G8929 Other chronic pain: Secondary | ICD-10-CM | POA: Diagnosis not present

## 2023-11-11 DIAGNOSIS — L309 Dermatitis, unspecified: Secondary | ICD-10-CM | POA: Diagnosis not present

## 2023-11-11 MED ORDER — OXYCODONE-ACETAMINOPHEN 5-325 MG PO TABS
1.0000 | ORAL_TABLET | Freq: Three times a day (TID) | ORAL | 0 refills | Status: AC
Start: 2023-12-14 — End: ?

## 2023-11-11 MED ORDER — OXYCODONE-ACETAMINOPHEN 5-325 MG PO TABS
1.0000 | ORAL_TABLET | Freq: Three times a day (TID) | ORAL | 0 refills | Status: AC
Start: 2023-11-14 — End: ?

## 2023-11-11 MED ORDER — OXYCODONE-ACETAMINOPHEN 5-325 MG PO TABS
1.0000 | ORAL_TABLET | Freq: Three times a day (TID) | ORAL | 0 refills | Status: AC
Start: 2024-01-13 — End: ?

## 2023-11-11 MED ORDER — TRIAMCINOLONE ACETONIDE 0.1 % EX CREA: 1.0000 | TOPICAL_CREAM | Freq: Three times a day (TID) | CUTANEOUS | 0 refills | Status: AC

## 2023-11-11 NOTE — Progress Notes (Signed)
 "  Subjective:  Patient ID: Lisa Parrish, female    DOB: [birth year]  Age: 63 y.o. MRN: [id number]  CC: Medical Management of Chronic Issues (3 month follow up/Patient concerned about  cortisol levels/Feet, skin is cracking and peeling)   HPI  Discussed the use of AI scribe software for clinical note transcription with the patient, who gave verbal consent to proceed.  History of Present Illness Lisa Parrish is a 63 year old female who presents for chronic pain management and foot issues.  She experiences chronic pain exacerbated by physical activities such as bending, stretching, and stooping during daily tasks like cleaning and laundry. The pain impacts most of her activities, but she reports it is not as bad as it has been before her medication was reduced. She manages her pain with oxycodone  5 mg three times a day and pregabalin  400 mg once at night. She mentions that Medicare has a high copay for her medications, but she found a lower price through [facility].  She has issues with her feet, specifically dryness and cracking in the same spot on both feet. She uses Neosporin and large band-aids to manage the condition. She has extremely dry skin and eczema on different parts of her body, which she manages with a pumice stone and moisturizers.  She is concerned about her cortisol levels affecting her weight loss efforts, mentioning stress and difficulty losing weight despite gym attendance. She has had her cortisol levels checked previously.          11/11/2023   10:43 AM 08/10/2023   10:18 AM 05/18/2023    2:09 PM  Depression screen PHQ 2/9  Decreased Interest 0 0 0  Down, Depressed, Hopeless 0 0 0  PHQ - 2 Score 0 0 0  Altered sleeping 0 2 0  Tired, decreased energy 2 1 0  Change in appetite 0 1 0  Feeling bad or failure about yourself  2 0 0  Trouble concentrating 1 0 0  Moving slowly or fidgety/restless 2 1 0  Suicidal thoughts 0 0 0  PHQ-9 Score 7 5 0  Difficult doing  work/chores Somewhat difficult  Not difficult at all    History Lisa Parrish has a past medical history of Allergy, Anxiety, Anxiety state (08/24/2012), Basal cell carcinoma, Chronic left SI joint pain, Constipation, Depression, GERD (gastroesophageal reflux disease), Headache, migraine, Hyperlipidemia, Hypertension, Kidney stones, Osteoarthritis, Pulmonary HTN (HCC) (12/21/2014), S/P cardiac cath 12/20/14 normal coronary arteries (12/21/2014), and Seasonal allergies.   She has a past surgical history that includes Hemorrhoid surgery (1989); Tubal ligation (1990); lumbar disckectomy (2006); Cardiac catheterization (N/A, 12/20/2014); Colonoscopy; Polypectomy; and Lumbar fusion (2018).   Her family history includes Cancer in her mother; Colon cancer in her paternal uncle; Colon cancer (age of onset: 11) in her paternal aunt and paternal uncle; Esophageal cancer in her brother; Heart disease in her father.She reports that she quit smoking about 32 years ago. Her smoking use included cigarettes. She started smoking about 42 years ago. She has a 10 pack-year smoking history. She has never used smokeless tobacco. She reports that she does not drink alcohol and does not use drugs.    ROS Review of Systems  Constitutional: Negative.   HENT: Negative.    Eyes:  Negative for visual disturbance.  Respiratory:  Negative for shortness of breath.   Cardiovascular:  Negative for chest pain.  Gastrointestinal:  Negative for abdominal pain.  Musculoskeletal:  Positive for back pain. Negative for arthralgias.    Objective:  BP  115/78   Pulse 89   Temp 98.5 F (36.9 C)   Ht 5' 6 (1.676 m)   Wt 186 lb (84.4 kg)   SpO2 94%   BMI 30.02 kg/m   BP Readings from Last 3 Encounters:  11/11/23 115/78  08/10/23 139/85  06/08/23 136/87    Wt Readings from Last 3 Encounters:  11/11/23 186 lb (84.4 kg)  08/10/23 186 lb (84.4 kg)  06/08/23 184 lb (83.5 kg)     Physical Exam Constitutional:      General: She  is not in acute distress.    Appearance: She is well-developed.  HENT:     Head: Normocephalic and atraumatic.  Eyes:     Conjunctiva/sclera: Conjunctivae normal.     Pupils: Pupils are equal, round, and reactive to light.  Neck:     Thyroid : No thyromegaly.  Cardiovascular:     Rate and Rhythm: Normal rate and regular rhythm.     Heart sounds: Normal heart sounds. No murmur heard. Pulmonary:     Effort: Pulmonary effort is normal. No respiratory distress.     Breath sounds: Normal breath sounds. No wheezing or rales.  Abdominal:     General: Bowel sounds are normal. There is no distension.     Palpations: Abdomen is soft.     Tenderness: There is no abdominal tenderness.  Musculoskeletal:        General: Normal range of motion.     Cervical back: Normal range of motion and neck supple.  Lymphadenopathy:     Cervical: No cervical adenopathy.  Skin:    General: Skin is warm and dry.  Neurological:     Mental Status: She is alert and oriented to person, place, and time.  Psychiatric:        Behavior: Behavior normal.        Thought Content: Thought content normal.        Judgment: Judgment normal.      Assessment & Plan:  Lumbar radiculopathy -     oxyCODONE -Acetaminophen ; Take 1 tablet by mouth in the morning, at noon, and at bedtime.  Dispense: 90 tablet; Refill: 0 -     oxyCODONE -Acetaminophen ; Take 1 tablet by mouth 3 (three) times daily.  Dispense: 90 tablet; Refill: 0 -     oxyCODONE -Acetaminophen ; Take 1 tablet by mouth in the morning, at noon, and at bedtime.  Dispense: 90 tablet; Refill: 0  Other chronic pain -     Cortisol  Overweight -     Cortisol  Opiate dependence, continuous (HCC)  Eczema, unspecified type  Other orders -     Triamcinolone  Acetonide; Apply 1 Application topically 3 (three) times daily. Avoid face and genitalia  Dispense: 45 g; Refill: 0    Assessment and Plan Assessment & Plan Chronic pain   Chronic pain affects her daily  activities and is managed with oxycodone  5 mg three times a day. She prefers to continue this regimen despite high copay issues with Medicare. Hydrocodone  was considered but is not preferred due to previous gastrointestinal issues. Pregabalin  is taken at night to manage pain and drowsiness. Continue oxycodone  5 mg three times a day and pregabalin  400 mg at night. Reassess pain levels in three months and consider alternative pain management options if insurance coverage changes.  Eczema with dry, cracked skin of feet and other areas   Eczema presents with dry, cracked skin on both feet, particularly in one spot on each foot, exacerbated by overall dry skin. Prescribe triamcinolone   cream to apply two to three times a day. Apply moisturizer, preferably Eucerin, three times a day after triamcinolone . Soak feet for ten minutes every evening, pat dry, lightly abrade with pumice, apply triamcinolone , wait fifteen minutes, then apply Eucerin. Avoid using triamcinolone  on face or genitals.       Follow-up: Return in about 3 months (around 02/10/2024).  Butler Der, M.D. "

## 2023-11-12 LAB — CORTISOL: Cortisol: 9 ug/dL (ref 6.2–19.4)

## 2023-11-13 ENCOUNTER — Encounter: Payer: Self-pay | Admitting: Family Medicine

## 2023-11-15 ENCOUNTER — Ambulatory Visit: Payer: Self-pay | Admitting: Family Medicine

## 2023-11-15 NOTE — Progress Notes (Signed)
Hello Jini,  Your lab result is normal and/or stable.Some minor variations that are not significant are commonly marked abnormal, but do not represent any medical problem for you.  Best regards, Mechele Claude, M.D.

## 2023-11-24 ENCOUNTER — Other Ambulatory Visit: Payer: Self-pay | Admitting: Family Medicine

## 2023-12-21 ENCOUNTER — Other Ambulatory Visit: Payer: Self-pay | Admitting: Family Medicine

## 2023-12-27 ENCOUNTER — Other Ambulatory Visit: Payer: Self-pay | Admitting: Family Medicine

## 2023-12-27 DIAGNOSIS — K219 Gastro-esophageal reflux disease without esophagitis: Secondary | ICD-10-CM

## 2024-01-13 ENCOUNTER — Other Ambulatory Visit: Payer: Self-pay | Admitting: *Deleted

## 2024-02-02 ENCOUNTER — Other Ambulatory Visit: Payer: Self-pay | Admitting: Family Medicine

## 2024-02-11 ENCOUNTER — Encounter: Payer: Self-pay | Admitting: Family Medicine

## 2024-02-11 ENCOUNTER — Ambulatory Visit: Payer: Self-pay | Admitting: Family Medicine

## 2024-02-11 VITALS — BP 138/87 | HR 80 | Temp 97.7°F | Ht 66.0 in | Wt 185.0 lb

## 2024-02-11 DIAGNOSIS — F32A Depression, unspecified: Secondary | ICD-10-CM | POA: Diagnosis not present

## 2024-02-11 DIAGNOSIS — M5416 Radiculopathy, lumbar region: Secondary | ICD-10-CM

## 2024-02-11 MED ORDER — OXYCODONE-ACETAMINOPHEN 5-325 MG PO TABS: 1.0000 | ORAL_TABLET | Freq: Three times a day (TID) | ORAL | 0 refills | Status: AC

## 2024-02-11 MED ORDER — BUPROPION HCL ER (XL) 300 MG PO TB24: 300.0000 mg | ORAL_TABLET | Freq: Every day | ORAL | 2 refills | Status: AC

## 2024-02-11 MED ORDER — PREGABALIN 200 MG PO CAPS: 400.0000 mg | ORAL_CAPSULE | Freq: Every day | ORAL | 1 refills | Status: AC

## 2024-02-11 MED ORDER — ESCITALOPRAM OXALATE 10 MG PO TABS: 10.0000 mg | ORAL_TABLET | Freq: Every day | ORAL | 1 refills | Status: DC

## 2024-02-11 NOTE — Progress Notes (Unsigned)
 Subjective:  Patient ID: Lisa Parrish, female    DOB: January 20, 1961  Age: 63 y.o. MRN: [id number]  CC: Medical Management of Chronic Issues (Fore head skin irritation started yesterday. Could a medication be causing this? Change in soap? Intense itching. )   HPI  Discussed the use of AI scribe software for clinical note transcription with the patient, who gave verbal consent to proceed.  History of Present Illness Lisa Parrish is a 63 year old female who presents with exercise-induced pain and worsening depression.  She experiences centralized, achy pain after exercising, which typically worsens the day after physical activity. She manages the soreness with Tylenol . She is currently taking oxycodone  three times a day, which reduces her pain from a seven to a four on a scale of ten.  She is experiencing worsening depression, particularly during the holiday season, which she attributes to the anniversary of her mother's passing fifty years ago. Her depression comes in waves, and she feels overwhelmed when it hits. She is not sleeping well, having only slept about seven hours over the past two days, and experiences racing thoughts that make it difficult to concentrate. She is currently taking Wellbutrin  300 mg.  She feels down, depressed, and hopeless, with decreased energy and poor sleep.          02/11/2024   11:25 AM 11/11/2023   10:43 AM 08/10/2023   10:18 AM  Depression screen PHQ 2/9  Decreased Interest 0 0 0  Down, Depressed, Hopeless 1 0 0  PHQ - 2 Score 1 0 0  Altered sleeping 2 0 2  Tired, decreased energy 2 2 1   Change in appetite 1 0 1  Feeling bad or failure about yourself  0 2 0  Trouble concentrating 2 1 0  Moving slowly or fidgety/restless 0 2 1  Suicidal thoughts 0 0 0  PHQ-9 Score 8 7  5    Difficult doing work/chores Somewhat difficult Somewhat difficult      Data saved with a previous flowsheet row definition    History Lisa Parrish has a past medical  history of Allergy, Anxiety, Anxiety state (08/24/2012), Basal cell carcinoma, Chronic left SI joint pain, Constipation, Depression, GERD (gastroesophageal reflux disease), Headache, migraine, Hyperlipidemia, Hypertension, Kidney stones, Osteoarthritis, Pulmonary HTN (HCC) (12/21/2014), S/P cardiac cath 12/20/14 normal coronary arteries (12/21/2014), and Seasonal allergies.   She has a past surgical history that includes Hemorrhoid surgery (1989); Tubal ligation (1990); lumbar disckectomy (2006); Cardiac catheterization (N/A, 12/20/2014); Colonoscopy; Polypectomy; and Lumbar fusion (2018).   Her family history includes Cancer in her mother; Colon cancer in her paternal uncle; Colon cancer (age of onset: 31) in her paternal aunt and paternal uncle; Esophageal cancer in her brother; Heart disease in her father.She reports that she quit smoking about 32 years ago. Her smoking use included cigarettes. She started smoking about 42 years ago. She has a 10 pack-year smoking history. She has never used smokeless tobacco. She reports that she does not drink alcohol and does not use drugs.    ROS Review of Systems  Constitutional: Negative.   HENT:  Negative for congestion.   Eyes:  Negative for visual disturbance.  Respiratory:  Negative for shortness of breath.   Cardiovascular:  Negative for chest pain.  Gastrointestinal:  Negative for abdominal pain, constipation, diarrhea, nausea and vomiting.  Genitourinary:  Negative for difficulty urinating.  Musculoskeletal:  Negative for arthralgias and myalgias.  Neurological:  Negative for headaches.  Psychiatric/Behavioral:  Negative for sleep disturbance.  Objective:  BP 138/87   Pulse 80   Temp 97.7 F (36.5 C)   Ht 5' 6 (1.676 m)   Wt 185 lb (83.9 kg)   SpO2 97%   BMI 29.86 kg/m   BP Readings from Last 3 Encounters:  02/11/24 138/87  11/11/23 115/78  08/10/23 139/85    Wt Readings from Last 3 Encounters:  02/11/24 185 lb (83.9 kg)   11/11/23 186 lb (84.4 kg)  08/10/23 186 lb (84.4 kg)     Physical Exam Physical Exam GENERAL: Alert, cooperative, well developed, no acute distress HEENT: Normocephalic, normal oropharynx, moist mucous membranes CHEST: Clear to auscultation bilaterally, No wheezes, rhonchi, or crackles CARDIOVASCULAR: Normal heart rate and rhythm, S1 and S2 normal without murmurs ABDOMEN: Soft, non-tender, non-distended, without organomegaly, Normal bowel sounds EXTREMITIES: No cyanosis or edema NEUROLOGICAL: Cranial nerves grossly intact, Moves all extremities without gross motor or sensory deficit   Assessment & Plan:  Lumbar radiculopathy -     oxyCODONE -Acetaminophen ; Take 1 tablet by mouth in the morning, at noon, and at bedtime.  Dispense: 90 tablet; Refill: 0 -     Pregabalin ; Take 2 capsules (400 mg total) by mouth at bedtime.  Dispense: 180 capsule; Refill: 1 -     oxyCODONE -Acetaminophen ; Take 1 tablet by mouth in the morning, at noon, and at bedtime.  Dispense: 90 tablet; Refill: 0 -     oxyCODONE -Acetaminophen ; Take 1 tablet by mouth in the morning, at noon, and at bedtime.  Dispense: 90 tablet; Refill: 0  Depression, unspecified depression type -     buPROPion  HCl ER (XL); Take 1 tablet (300 mg total) by mouth daily. Dx:  BEN.BORN.A  Dispense: 120 tablet; Refill: 2 -     Escitalopram  Oxalate; Take 1 tablet (10 mg total) by mouth daily.  Dispense: 90 tablet; Refill: 1    Assessment and Plan Assessment & Plan Major depressive disorder   She experiences waves of depression, especially during the holidays, with sadness, hopelessness, and difficulty sleeping. Wellbutrin  300 mg is insufficient. Added Lexapro  (escitalopram ) to her regimen. Follow-up scheduled in six weeks to assess response to the new medication.  Lumbar radiculopathy with chronic pain   Chronic pain is managed with oxycodone , reducing pain from 7 to 4. Exercise exacerbates pain, described as achy and sore. She takes three  oxycodone  tablets daily. Discussed adding ibuprofen  for exercise-related soreness, with kidney function confirmed adequate for its use. Advised against increasing oxycodone  dose due to risk of tolerance and potential for increased dosage requirements. Continue current oxycodone  regimen. Add ibuprofen  as needed for exercise-related soreness. Monitor kidney function periodically.       Follow-up: Return in about 6 weeks (around 03/24/2024) for Depression.  Butler Der, M.D.

## 2024-02-12 ENCOUNTER — Telehealth: Payer: Self-pay | Admitting: Family Medicine

## 2024-02-12 NOTE — Telephone Encounter (Signed)
 I called and spoke with patient and made her aware that I would send this message to PCP to advise on, but that he is off on Fridays so he may not get the message until Monday. Patient voiced understanding. She said she would be fine until then without the medication and will wait to hear back from PCP/nurse on Monday. Requesting something else be prescribed to her.    Copied from CRM #[id number]. Topic: Clinical - Medication Question >> Feb 12, 2024  9:23 AM Franky GRADE wrote: Reason for CRM: Patient was prescribed escitalopram  (LEXAPRO ) 10 MG tablet [[id number]] yesterday; however, she has taken this medication before and it causes some side effects she does not want to experience again. sudden involuntary facial tics and involuntary had movements.

## 2024-02-14 ENCOUNTER — Other Ambulatory Visit: Payer: Self-pay | Admitting: Family Medicine

## 2024-02-14 MED ORDER — DESVENLAFAXINE SUCCINATE ER 50 MG PO TB24: 50.0000 mg | ORAL_TABLET | Freq: Every day | ORAL | 3 refills | Status: AC

## 2024-02-14 NOTE — Telephone Encounter (Signed)
 Substitute med sent to [facility] for her

## 2024-02-15 ENCOUNTER — Other Ambulatory Visit: Payer: Self-pay | Admitting: Family Medicine

## 2024-02-15 NOTE — Telephone Encounter (Signed)
 Pt informed    LS

## 2024-03-20 ENCOUNTER — Other Ambulatory Visit: Payer: Self-pay | Admitting: Family Medicine

## 2024-03-20 DIAGNOSIS — K219 Gastro-esophageal reflux disease without esophagitis: Secondary | ICD-10-CM

## 2024-03-30 ENCOUNTER — Ambulatory Visit: Payer: Medicare (Managed Care) | Admitting: Family Medicine

## 2024-04-08 ENCOUNTER — Other Ambulatory Visit: Payer: Self-pay | Admitting: Family Medicine

## 2024-05-04 ENCOUNTER — Ambulatory Visit: Payer: Medicare (Managed Care) | Admitting: Family Medicine
